# Patient Record
Sex: Female | Born: 1955 | Race: White | Hispanic: Yes | Marital: Married | Smoking: Never smoker
Health system: Southern US, Community
[De-identification: ages and names within clinical notes are randomized; demographics above are authoritative.]

## PROBLEM LIST (undated history)

## (undated) DIAGNOSIS — T4145XA Adverse effect of unspecified anesthetic, initial encounter: Secondary | ICD-10-CM

## (undated) DIAGNOSIS — T782XXA Anaphylactic shock, unspecified, initial encounter: Secondary | ICD-10-CM

## (undated) DIAGNOSIS — T8859XA Other complications of anesthesia, initial encounter: Secondary | ICD-10-CM

## (undated) DIAGNOSIS — F329 Major depressive disorder, single episode, unspecified: Secondary | ICD-10-CM

## (undated) DIAGNOSIS — K5900 Constipation, unspecified: Secondary | ICD-10-CM

## (undated) DIAGNOSIS — N764 Abscess of vulva: Secondary | ICD-10-CM

## (undated) DIAGNOSIS — Z86010 Personal history of colonic polyps: Secondary | ICD-10-CM

## (undated) DIAGNOSIS — J45909 Unspecified asthma, uncomplicated: Secondary | ICD-10-CM

## (undated) DIAGNOSIS — M1990 Unspecified osteoarthritis, unspecified site: Secondary | ICD-10-CM

## (undated) DIAGNOSIS — F32A Depression, unspecified: Secondary | ICD-10-CM

## (undated) DIAGNOSIS — R011 Cardiac murmur, unspecified: Secondary | ICD-10-CM

## (undated) DIAGNOSIS — F419 Anxiety disorder, unspecified: Secondary | ICD-10-CM

## (undated) HISTORY — DX: Unspecified asthma, uncomplicated: J45.909

## (undated) HISTORY — DX: Major depressive disorder, single episode, unspecified: F32.9

## (undated) HISTORY — DX: Unspecified osteoarthritis, unspecified site: M19.90

## (undated) HISTORY — DX: Depression, unspecified: F32.A

## (undated) HISTORY — PX: TONSILLECTOMY: SUR1361

## (undated) HISTORY — PX: KNEE ARTHROSCOPY: SHX127

## (undated) HISTORY — DX: Anxiety disorder, unspecified: F41.9

## (undated) HISTORY — DX: Cardiac murmur, unspecified: R01.1

## (undated) HISTORY — PX: WRIST SURGERY: SHX841

---

## 1982-04-22 HISTORY — PX: HEMORRHOID SURGERY: SHX153

## 1999-06-02 ENCOUNTER — Emergency Department (HOSPITAL_COMMUNITY): Admission: EM | Admit: 1999-06-02 | Discharge: 1999-06-02 | Payer: Self-pay | Admitting: Emergency Medicine

## 1999-06-02 ENCOUNTER — Encounter: Payer: Self-pay | Admitting: Emergency Medicine

## 1999-09-12 ENCOUNTER — Other Ambulatory Visit: Admission: RE | Admit: 1999-09-12 | Discharge: 1999-09-12 | Payer: Self-pay | Admitting: Gynecology

## 1999-10-12 ENCOUNTER — Other Ambulatory Visit: Admission: RE | Admit: 1999-10-12 | Discharge: 1999-10-12 | Payer: Self-pay | Admitting: Obstetrics and Gynecology

## 2000-10-28 ENCOUNTER — Other Ambulatory Visit: Admission: RE | Admit: 2000-10-28 | Discharge: 2000-10-28 | Payer: Self-pay | Admitting: Obstetrics and Gynecology

## 2001-06-11 ENCOUNTER — Ambulatory Visit (HOSPITAL_COMMUNITY): Admission: RE | Admit: 2001-06-11 | Discharge: 2001-06-11 | Payer: Self-pay | Admitting: Obstetrics and Gynecology

## 2001-06-11 ENCOUNTER — Encounter: Payer: Self-pay | Admitting: Obstetrics and Gynecology

## 2001-11-26 ENCOUNTER — Other Ambulatory Visit: Admission: RE | Admit: 2001-11-26 | Discharge: 2001-11-26 | Payer: Self-pay | Admitting: Obstetrics and Gynecology

## 2002-05-21 ENCOUNTER — Encounter: Admission: RE | Admit: 2002-05-21 | Discharge: 2002-05-21 | Payer: Self-pay | Admitting: *Deleted

## 2002-05-21 ENCOUNTER — Encounter: Payer: Self-pay | Admitting: Allergy and Immunology

## 2003-02-23 ENCOUNTER — Other Ambulatory Visit: Admission: RE | Admit: 2003-02-23 | Discharge: 2003-02-23 | Payer: Self-pay | Admitting: Obstetrics and Gynecology

## 2003-11-23 ENCOUNTER — Ambulatory Visit (HOSPITAL_COMMUNITY): Admission: RE | Admit: 2003-11-23 | Discharge: 2003-11-23 | Payer: Self-pay | Admitting: Internal Medicine

## 2003-12-06 ENCOUNTER — Encounter: Admission: RE | Admit: 2003-12-06 | Discharge: 2003-12-06 | Payer: Self-pay | Admitting: Family Medicine

## 2004-03-21 ENCOUNTER — Other Ambulatory Visit: Admission: RE | Admit: 2004-03-21 | Discharge: 2004-03-21 | Payer: Self-pay | Admitting: Obstetrics and Gynecology

## 2005-05-01 ENCOUNTER — Other Ambulatory Visit: Admission: RE | Admit: 2005-05-01 | Discharge: 2005-05-01 | Payer: Self-pay | Admitting: Obstetrics & Gynecology

## 2006-08-07 ENCOUNTER — Other Ambulatory Visit: Admission: RE | Admit: 2006-08-07 | Discharge: 2006-08-07 | Payer: Self-pay | Admitting: *Deleted

## 2011-05-23 ENCOUNTER — Ambulatory Visit (INDEPENDENT_AMBULATORY_CARE_PROVIDER_SITE_OTHER): Payer: BC Managed Care – PPO | Admitting: Family Medicine

## 2011-05-23 VITALS — BP 118/78 | HR 72 | Temp 98.0°F | Resp 16 | Ht 60.0 in | Wt 176.0 lb

## 2011-05-23 DIAGNOSIS — R110 Nausea: Secondary | ICD-10-CM

## 2011-05-23 DIAGNOSIS — Z0000 Encounter for general adult medical examination without abnormal findings: Secondary | ICD-10-CM

## 2011-05-23 DIAGNOSIS — G4700 Insomnia, unspecified: Secondary | ICD-10-CM

## 2011-05-23 DIAGNOSIS — F32A Depression, unspecified: Secondary | ICD-10-CM

## 2011-05-23 DIAGNOSIS — F329 Major depressive disorder, single episode, unspecified: Secondary | ICD-10-CM | POA: Insufficient documentation

## 2011-05-23 DIAGNOSIS — R200 Anesthesia of skin: Secondary | ICD-10-CM

## 2011-05-23 DIAGNOSIS — R202 Paresthesia of skin: Secondary | ICD-10-CM

## 2011-05-23 DIAGNOSIS — E669 Obesity, unspecified: Secondary | ICD-10-CM

## 2011-05-23 DIAGNOSIS — R635 Abnormal weight gain: Secondary | ICD-10-CM

## 2011-05-23 DIAGNOSIS — R209 Unspecified disturbances of skin sensation: Secondary | ICD-10-CM

## 2011-05-23 DIAGNOSIS — M1990 Unspecified osteoarthritis, unspecified site: Secondary | ICD-10-CM

## 2011-05-23 LAB — POCT CBC
HCT, POC: 40.8 % (ref 37.7–47.9)
Hemoglobin: 13.3 g/dL (ref 12.2–16.2)
Lymph, poc: 3.3 (ref 0.6–3.4)
MCH, POC: 30.6 pg (ref 27–31.2)
MCHC: 32.6 g/dL (ref 31.8–35.4)
MPV: 7.1 fL (ref 0–99.8)
POC MID %: 6.4 %M (ref 0–12)
RBC: 4.34 M/uL (ref 4.04–5.48)
WBC: 8.6 10*3/uL (ref 4.6–10.2)

## 2011-05-23 LAB — POCT UA - MICROSCOPIC ONLY
Casts, Ur, LPF, POC: NEGATIVE
Crystals, Ur, HPF, POC: NEGATIVE
Yeast, UA: NEGATIVE

## 2011-05-23 LAB — POCT URINALYSIS DIPSTICK
Glucose, UA: NEGATIVE
Nitrite, UA: NEGATIVE
Protein, UA: NEGATIVE
Spec Grav, UA: 1.015
Urobilinogen, UA: 0.2
pH, UA: 5.5

## 2011-05-23 LAB — TSH: TSH: 1.298 u[IU]/mL (ref 0.350–4.500)

## 2011-05-23 MED ORDER — BUPROPION HCL ER (SR) 150 MG PO TB12: 150.0000 mg | ORAL_TABLET | Freq: Two times a day (BID) | ORAL | Status: DC

## 2011-05-23 MED ORDER — ZOLPIDEM TARTRATE 10 MG PO TABS: 10.0000 mg | ORAL_TABLET | Freq: Every evening | ORAL | Status: DC | PRN

## 2011-05-23 NOTE — Progress Notes (Signed)
Patient Name: Ebony Stanley Date of Birth: 01-05-1956 Medical Record Number: [id number] Gender: female Date of Encounter: 05/23/2011  History of Present Illness:  Ebony Stanley is a 56 y.o. very pleasant female patient who presents with the following:  Desire for complete physical exam.  Dr. Truett Stanley does her pap/ breast exam- last done July 2012.  Does have some arthritis of her wrists.  Has been helped by Celebrex in the past.  She would like more Celebrex.  Does type/ use computer a lot. Also notes numbness "for years" that can occur with extension of her neck.  Numbness had been only in her right hand, but more recently has gone into her left hand as well.  Had seen a chiropractor for this but did not improve.  Wonders if she has a pinched nerve or bulging disc.  Also notes occasional burning pain left shoulder blade that usually occurs after prolonged sitting- this has been present for about 6 months.  Did have x-rays at chiropractor.  Told that her films showed ? "a bulging disc".    Is fasting currently.  But had cholesterol testing in July.  She is trying to work on her diet but admits to slipping up lately  Also notes nausea after eating sometimes -usually occurs about 10 minutes after a meal.  Not really pain- more nausea.  Has tried some tums but nothing else.  No actual vomiting.   Also notes anxiety and depression.  Uses wellbutrin- has used for some time.  Notes trouble with sex drive for some time.  Has discussed this with her OBGyn who started her on progesterone- however still not much better.  Also has trouble with insomnia- has used Ebony Stanley in the past with success   Colonoscopy UTD- done 11/11.  Told to follow-up in a year but she does not want to return to Ebony Stanley- prefers somewhere local.   Immunizations: tetanus 2010, flu shot for the year done.   There is no problem list on file for this patient.  Past Medical History  Diagnosis Date  . Depression     . Arthritis    Past Surgical History  Procedure Date  . Knee arthroscopy   . Hemorrhoid surgery 1984   History  Substance Use Topics  . Smoking status: Never Smoker   . Smokeless tobacco: Never Used  . Alcohol Use: Yes     very rare   Family History  Problem Relation Age of Onset  . Cancer Mother   . Alcohol abuse Father   . Cancer Maternal Grandmother    No Known Allergies  Medication list has been reviewed and updated.  Review of Systems: As per HPI.  + for insomnia, weight gain, anxiety, lack of sex drive, heartburn, numbness and tingling.  Otherwise negative- pink sheet reviewed.    Physical Examination: Filed Vitals:   05/23/11 1433  BP: 118/78  Pulse: 72  Temp: 98 F (36.7 C)  TempSrc: Oral  Resp: 16  Height: 5' (1.524 m)  Weight: 176 lb (79.833 kg)    Body mass index is 34.37 kg/(m^2).   Wt Readings from Last 3 Encounters:  05/23/11 176 lb (79.833 kg)    GEN: well developed, well nourished, no acute distress Eyes: conjunctiva and lids normal, PERRLA, EOMI ENT: TM clear, nares clear, oral exam WNL Neck: supple, no lymphadenopathy, no thyromegaly, no JVD.  Cervical spine exam normal Pulm: clear to auscultation and percussion, respiratory effort normal CV: regular rate and rhythm, S1-S2,  no murmur, rub or gallop, no bruits Chest: no scars, masses, no lumps Stanley: soft, non-tender; no hepatosplenomegaly, no masses; active bowel sounds all quadrants.  Notes some tenderness in RUQ, mildly + murphy's sign- not an acute abdomen Lymph: no cervical, axillary or inguinal adenopathy MSK: gait normal, muscle tone and strength WNL, no joint swelling, effusions, discoloration, crepitus  SKIN: clear, good turgor, color WNL, no rashes, lesions, or ulcerations Neuro: normal mental status, normal strength, sensation, and motion Psych: alert; oriented to person, place and time, normally interactive and not anxious or depressed in appearance.  Results for orders placed  in visit on 05/23/11  POCT URINALYSIS DIPSTICK      Component Value Range   Color, UA YELLOW     Clarity, UA CLEAR     Glucose, UA NEG     Bilirubin, UA NEG     Ketones, UA TRACE     Spec Grav, UA 1.015     Blood, UA TRACE     pH, UA 5.5     Protein, UA NEG     Urobilinogen, UA 0.2     Nitrite, UA NEG     Leukocytes, UA Negative    POCT UA - MICROSCOPIC ONLY      Component Value Range   WBC, Ur, HPF, POC 0-1     RBC, urine, microscopic 0-3     Bacteria, U Microscopic SMALL     Mucus, UA NEG     Epithelial cells, urine per micros 2-5     Crystals, Ur, HPF, POC NEG     Casts, Ur, LPF, POC NEG     Yeast, UA NEG    POCT CBC      Component Value Range   WBC 8.6  4.6 - 10.2 (K/uL)   Lymph, poc 3.3  0.6 - 3.4    POC LYMPH PERCENT 37.8  10 - 50 (%L)   MID (cbc) 0.6  0 - 0.9    POC MID % 6.4  0 - 12 (%M)   POC Granulocyte 4.8  2 - 6.9    Granulocyte percent 55.8  37 - 80 (%G)   RBC 4.34  4.04 - 5.48 (M/uL)   Hemoglobin 13.3  12.2 - 16.2 (g/dL)   HCT, POC 16.1  09.6 - 47.9 (%)   MCV 93.9  80 - 97 (fL)   MCH, POC 30.6  27 - 31.2 (pg)   MCHC 32.6  31.8 - 35.4 (g/dL)   RDW, POC 04.5     Platelet Count, POC 483 (*) 142 - 424 (K/uL)   MPV 7.1  0 - 99.8 (fL)     Assessment and Plan: 1. Physical exam, annual  POCT urinalysis dipstick, POCT UA - Microscopic Only, POCT CBC  2. Obesity  TSH  3. Weight gain  TSH  4. Depression  TSH  5. Numbness and tingling in hands    6. Nausea  Comprehensive metabolic panel  7. Osteoarthritis     Health maint: pap and breast per OB.  Pt will call and schedule a colonoscopy with Ebony Stanley- she understands importance of following up her recent colonoscopy Former pt of Ebony Stanley who did her knee scopes.  Will refer her back to see him to evaluate her cervical spine- I do suspect that she may have nerve impingement causing numbness in her hands with neck extension. Abdominal discomfort and nausea: suspect gallbladder disease.  Referral for ABD  ultrasound.  In the meantime start an OTC acid  reducer such as zantac or pepcid.  If anything gets worse while ultrasound is pending please call or RTC!  Depression/ lack of sex drive.  We noticed that she is taking her wellbutrin SR only once daily- this is often used as a BID medication.  Will try increasing to twice daily to see if this may help with her symptoms.  Also did rx a supply of ambien to use for her insomnia. Discussed with patient face to face in the office- limit use, take right when getting into bed, do not drive after medication, do not mix with alcohol.   Otherwise plan to follow- up pending her labs and studies ######### few RBC in urine with pt at lab phone call

## 2011-05-23 NOTE — Patient Instructions (Signed)
Call [place] [facility] to schedule a follow- up colonoscopy.  Would be a very good idea to try and get your last colonoscopy report from [place] first  I will set up an abdominal ultrasound and also an appointment with an orthopedist.  Try adding an OTC acid reducer for your stomach  Remember to take the ambien only when needed- avoid everyday use when possible.  Remember to take and then get directly into bed.  Do not mix with alcohol  Increase the wellbutrin to twice daily.

## 2011-05-24 LAB — COMPREHENSIVE METABOLIC PANEL
ALT: 27 U/L (ref 0–35)
CO2: 25 mEq/L (ref 19–32)
Calcium: 9.6 mg/dL (ref 8.4–10.5)
Chloride: 101 mEq/L (ref 96–112)
Creat: 0.56 mg/dL (ref 0.50–1.10)
Glucose, Bld: 85 mg/dL (ref 70–99)
Total Protein: 6.7 g/dL (ref 6.0–8.3)

## 2011-07-16 ENCOUNTER — Encounter: Payer: Self-pay | Admitting: Internal Medicine

## 2011-08-26 ENCOUNTER — Other Ambulatory Visit: Payer: Self-pay | Admitting: Internal Medicine

## 2011-11-15 ENCOUNTER — Telehealth: Payer: Self-pay | Admitting: *Deleted

## 2011-11-15 ENCOUNTER — Encounter: Payer: Self-pay | Admitting: Internal Medicine

## 2011-11-15 ENCOUNTER — Ambulatory Visit (AMBULATORY_SURGERY_CENTER): Payer: Self-pay | Admitting: *Deleted

## 2011-11-15 VITALS — Ht 60.0 in | Wt 173.0 lb

## 2011-11-15 DIAGNOSIS — Z1211 Encounter for screening for malignant neoplasm of colon: Secondary | ICD-10-CM

## 2011-11-15 MED ORDER — MOVIPREP 100 G PO SOLR: ORAL | Status: DC

## 2011-11-15 NOTE — Progress Notes (Signed)
Patient states last colonoscopy was 2011 at [facility], [place]. She states that was incomplete because she was not cleaned out and polyps was removed. She was told to repeat colonoscopy in 1 year. She then states she did not complete the entire prep because she thought she was cleaned out enough. Release of information filled out and given to Amanda,CMA.

## 2011-11-15 NOTE — Telephone Encounter (Signed)
Patient last colonoscopy was 2011 at [facility], [place]. She states it was incomplete because she was not cleaned out and states she did not drink all the prep ordered. She thought she was cleaned out enough. She does have chronic constipation. Take daily senokot. She states she had polyps removed and was told to repeat colonoscopy in 1 year. Release of information filled out and given to Amanda,CMA.  The standard moviprep was given to patient. Dr.Gessner may want more prep after seeing her last colonoscopy report. Thanks, Robbin.

## 2011-11-21 ENCOUNTER — Telehealth: Payer: Self-pay | Admitting: Internal Medicine

## 2011-11-21 NOTE — Telephone Encounter (Signed)
Forward 6 pages from [facility] to Dr. Stan Head for review on 11-21-11 ym

## 2011-11-22 NOTE — Telephone Encounter (Signed)
We got records in today for [employer].  Let us know if need to keep colon set up or cancel.  Thank you.

## 2011-11-23 NOTE — Telephone Encounter (Signed)
Continue with plans for colonoscopy

## 2011-11-25 NOTE — Telephone Encounter (Signed)
Thank you Sir.

## 2011-11-29 ENCOUNTER — Encounter: Payer: Self-pay | Admitting: Internal Medicine

## 2012-02-03 ENCOUNTER — Ambulatory Visit (INDEPENDENT_AMBULATORY_CARE_PROVIDER_SITE_OTHER): Payer: BC Managed Care – PPO | Admitting: Family Medicine

## 2012-02-03 VITALS — BP 128/76 | HR 111 | Temp 98.9°F | Resp 17 | Ht 60.0 in | Wt 177.0 lb

## 2012-02-03 DIAGNOSIS — J029 Acute pharyngitis, unspecified: Secondary | ICD-10-CM

## 2012-02-03 MED ORDER — AMOXICILLIN 875 MG PO TABS: 875.0000 mg | ORAL_TABLET | Freq: Two times a day (BID) | ORAL | Status: DC

## 2012-02-03 NOTE — Patient Instructions (Signed)

## 2012-02-03 NOTE — Progress Notes (Signed)
@[email or web address] @   Patient ID: Ebony Stanley MRN: [id number], DOB: 1955/05/12, 56 y.o. Date of Encounter: 02/03/2012, 1:30 PM  Primary Physician: Elvina Sidle, MD  Chief Complaint:  Chief Complaint  Patient presents with  . Sore Throat    on right side   . Fatigue    body pain     HPI: 56 y.o. year old female presents with 1 day history of sore throat. Subjective fever and chills. No cough, congestion, rhinorrhea, sinus pressure, otalgia, or headache. Normal hearing. No GI complaints. Able to swallow saliva, but hurts to do so. Decreased appetite secondary to sore throat.   Past Medical History  Diagnosis Date  . Depression   . Arthritis   . Heart murmur     as child  . Asthma     when pregnant     Home Meds: Prior to Admission medications   Medication Sig Start Date End Date Taking? Authorizing Provider  buPROPion (WELLBUTRIN SR) 150 MG 12 hr tablet Take 1 tablet (150 mg total) by mouth 2 (two) times daily. 05/23/11  Yes Gwenlyn Found Copland, MD  estradiol (ESTRACE) 1 MG tablet Take 1 mg by mouth daily.   Yes Historical Provider, MD  fish oil-omega-3 fatty acids 1000 MG capsule Take 2 g by mouth daily.   Yes Historical Provider, MD  ibuprofen (ADVIL,MOTRIN) 200 MG tablet Take 400 mg by mouth every 6 (six) hours as needed.   Yes Historical Provider, MD  Multiple Vitamins-Minerals (MULTIVITAMIN WITH MINERALS) tablet Take 1 tablet by mouth daily.   Yes Historical Provider, MD  naproxen sodium (ANAPROX) 220 MG tablet Take 220 mg by mouth daily as needed.   Yes Historical Provider, MD  polycarbophil (FIBERCON) 625 MG tablet Take 625 mg by mouth daily.   Yes Historical Provider, MD  progesterone (PROMETRIUM) 100 MG capsule Take 100 mg by mouth daily.   Yes Historical Provider, MD  senna (SENOKOT) 8.6 MG tablet Take 3 tablets by mouth 2 (two) times daily.   Yes Historical Provider, MD  docusate calcium (SURFAK) 240 MG capsule Take 240 mg by mouth 2 (two) times daily.    Historical  Provider, MD  MOVIPREP 100 G SOLR moviprep-take as directed. 11/15/11   Iva Boop, MD    Allergies: No Known Allergies  History   Social History  . Marital Status: Single    Spouse Name: N/A    Number of Children: N/A  . Years of Education: N/A   Occupational History  . Not on file.   Social History Main Topics  . Smoking status: Never Smoker   . Smokeless tobacco: Never Used  . Alcohol Use: Yes     very rare  . Drug Use: No  . Sexually Active: Not on file     G8- has 2 childen, 1sab, 4tab   Other Topics Concern  . Not on file   Social History Narrative  . No narrative on file     Review of Systems: Constitutional: negative for chills, fever, night sweats or weight changes HEENT: see above Cardiovascular: negative for chest pain or palpitations Respiratory: negative for hemoptysis, wheezing, or shortness of breath Abdominal: negative for abdominal pain, nausea, vomiting or diarrhea Dermatological: negative for rash Neurologic: negative for headache   Physical Exam Blood pressure 128/76, pulse 111, temperature 98.9 F (37.2 C), temperature source Oral, resp. rate 17, height 5' (1.524 m), weight 177 lb (80.287 kg), SpO2 97.00%., Body mass index is 34.57 kg/(m^2). General: Well developed, well nourished, in  no acute distress. Head: Normocephalic, atraumatic, eyes without discharge, sclera non-icteric, nares are patent. Bilateral auditory canals clear, TM's are without perforation, pearly grey with reflective cone of light bilaterally. No sinus TTP. Oral cavity moist, dentition normal. Posterior pharynx with post nasal drip and mild erythema. No peritonsillar abscess or tonsillar exudate. Neck: Supple. No thyromegaly. Full ROM. No lymphadenopathy. Lungs: Clear bilaterally to auscultation without wheezes, rales, or rhonchi. Breathing is unlabored. Heart: RRR with S1 S2. No murmurs, rubs, or gallops appreciated. Abdomen: Soft, non-tender, non-distended with  normoactive bowel sounds. No hepatomegaly. No rebound/guarding. No obvious abdominal masses. Msk:  Strength and tone normal for age. Extremities: No clubbing or cyanosis. No edema. Neuro: Alert and oriented X 3. Moves all extremities spontaneously. CNII-XII grossly in tact. Psych:  Responds to questions appropriately with a normal affect.   Labs:   ASSESSMENT AND PLAN:  56 y.o. year old female with  - -Tylenol/Motrin prn -Rest/fluids -RTC precautions -RTC 3-5 days if no improvement  Signed, Elvina Sidle, MD 02/03/2012 1:30 PM

## 2012-02-06 LAB — CULTURE, GROUP A STREP

## 2012-06-08 ENCOUNTER — Ambulatory Visit (INDEPENDENT_AMBULATORY_CARE_PROVIDER_SITE_OTHER): Payer: BC Managed Care – PPO | Admitting: Family Medicine

## 2012-06-08 ENCOUNTER — Ambulatory Visit
Admission: RE | Admit: 2012-06-08 | Discharge: 2012-06-08 | Disposition: A | Discharge status: Home / Self Care | Payer: BC Managed Care – PPO | Source: Ambulatory Visit | Attending: Family Medicine | Admitting: Family Medicine

## 2012-06-08 ENCOUNTER — Telehealth: Payer: Self-pay

## 2012-06-08 VITALS — BP 132/83 | HR 103 | Temp 98.0°F | Resp 16 | Ht 60.0 in | Wt 176.0 lb

## 2012-06-08 DIAGNOSIS — R1032 Left lower quadrant pain: Secondary | ICD-10-CM

## 2012-06-08 DIAGNOSIS — N323 Diverticulum of bladder: Secondary | ICD-10-CM

## 2012-06-08 DIAGNOSIS — R110 Nausea: Secondary | ICD-10-CM

## 2012-06-08 DIAGNOSIS — Z860100 Personal history of colon polyps, unspecified: Secondary | ICD-10-CM

## 2012-06-08 DIAGNOSIS — K625 Hemorrhage of anus and rectum: Secondary | ICD-10-CM

## 2012-06-08 LAB — POCT URINALYSIS DIPSTICK
Bilirubin, UA: NEGATIVE
Ketones, UA: NEGATIVE
Leukocytes, UA: NEGATIVE
Nitrite, UA: NEGATIVE
Protein, UA: NEGATIVE
pH, UA: 5.5

## 2012-06-08 LAB — POCT UA - MICROSCOPIC ONLY
RBC, urine, microscopic: NEGATIVE
Yeast, UA: NEGATIVE

## 2012-06-08 LAB — POCT CBC
Granulocyte percent: 55.6 %G (ref 37–80)
HCT, POC: 45.2 % (ref 37.7–47.9)
MCH, POC: 31.2 pg (ref 27–31.2)
MCV: 94.6 fL (ref 80–97)
MID (cbc): 0.6 (ref 0–0.9)
POC LYMPH PERCENT: 39.1 %L (ref 10–50)
RBC: 4.78 M/uL (ref 4.04–5.48)
WBC: 10.9 10*3/uL — AB (ref 4.6–10.2)

## 2012-06-08 MED ORDER — IOHEXOL 300 MG/ML  SOLN
100.0000 mL | Freq: Once | INTRAMUSCULAR | Status: AC | PRN
  Administered 2012-06-08: 100 mL via INTRAVENOUS

## 2012-06-08 NOTE — Telephone Encounter (Signed)
Called patient she states she is having blood with bowel movement. She states it is in toilet bowl and also on the toilet paper. She states she is not feeling well today also. I have urged her to come in to clinic today, to check to see if this is blood in her stool or if it is hemorrhoids.

## 2012-06-08 NOTE — Telephone Encounter (Signed)
PT HAD GONE TO THE BATHROOM AND HAVE BLOOD COMING OUT WITH HER URINE. CANNOT COME IN RIGHT NOW, BUT WOULD LIKE TO SPEAK WITH SOMEONE ABOUT IT. PLEASE CALL [phone]  SHE HOPE IT WILL BE BEFORE 3:00

## 2012-06-08 NOTE — Progress Notes (Signed)
[facility] [address], [place] [address] [phone]  Date:  06/08/2012   Name:  Ebony Stanley   DOB:  March 28, 1956   MRN:  [id number]  PCP:  Elvina Sidle, MD    Chief Complaint: Rectal Bleeding   History of Present Illness:  Ebony Stanley is a 57 y.o. very pleasant female patient who presents with the following:  She tends to have a sensitive stomach with intermittent upset.  Yesterday she felt bloated- had a BM and felt better.  She felt nauseated this am, tried eating a small amount.  She went to the bathroom and urinated/ had a BM.  She noted that the water in the bowel looked pink, and she wiped and noted a small amount of blood on the TP from her rectum. She returned to the bathroom about an hour later and the same thing happened.    She has a history of hemorrhoids, but had not strained or had any pain with defecation.    She was to have her 2nd colonoscopy this past august- this had to be canceled due to a vacation.  She was noted to have polyps at her colonoscopy in 2012 and was advised to repeat in ONE year.  This was done per [facility]  She has had some bleeding in the past with hemorrhoids, but this seems different.   No vomiting.  Her stomach feels "like I did sit- ups, it's sore"    She went through menopause about 6 years ago.  She did eat a small amount this morning but does not feel much like eating now.    Patient Active Problem List  Diagnosis  . Insomnia  . Depression    Past Medical History  Diagnosis Date  . Depression   . Arthritis   . Heart murmur     as child  . Asthma     when pregnant    Past Surgical History  Procedure Laterality Date  . Knee arthroscopy    . Hemorrhoid surgery  1984    History  Substance Use Topics  . Smoking status: Never Smoker   . Smokeless tobacco: Never Used  . Alcohol Use: Yes     Comment: very rare    Family History  Problem Relation Age of Onset   . Cancer Mother   . Alcohol abuse Father   . Cancer Maternal Grandmother   . Colon cancer Neg Hx     No Known Allergies  Medication list has been reviewed and updated.  Current Outpatient Prescriptions on File Prior to Visit  Medication Sig Dispense Refill  . buPROPion (WELLBUTRIN SR) 150 MG 12 hr tablet Take 1 tablet (150 mg total) by mouth 2 (two) times daily.  60 tablet  6  . docusate calcium (SURFAK) 240 MG capsule Take 240 mg by mouth 2 (two) times daily.      Marland Kitchen estradiol (ESTRACE) 1 MG tablet Take 1 mg by mouth daily.      . fish oil-omega-3 fatty acids 1000 MG capsule Take 2 g by mouth daily.      Marland Kitchen ibuprofen (ADVIL,MOTRIN) 200 MG tablet Take 400 mg by mouth every 6 (six) hours as needed.      . Multiple Vitamins-Minerals (MULTIVITAMIN WITH MINERALS) tablet Take 1 tablet by mouth daily.      . naproxen sodium (ANAPROX) 220 MG tablet Take 220 mg by mouth daily as needed.      . progesterone (  PROMETRIUM) 100 MG capsule Take 100 mg by mouth daily.      Marland Kitchen senna (SENOKOT) 8.6 MG tablet Take 3 tablets by mouth 2 (two) times daily.      Marland Kitchen amoxicillin (AMOXIL) 875 MG tablet Take 1 tablet (875 mg total) by mouth 2 (two) times daily.  20 tablet  0  . MOVIPREP 100 G SOLR moviprep-take as directed.  1 kit  0  . polycarbophil (FIBERCON) 625 MG tablet Take 625 mg by mouth daily.       No current facility-administered medications on file prior to visit.    Review of Systems:  As per HPI- otherwise negative.   Physical Examination: Filed Vitals:   06/08/12 1235  BP: 132/83  Pulse: 103  Temp: 98 F (36.7 C)  Resp: 16   Filed Vitals:   06/08/12 1235  Height: 5' (1.524 m)  Weight: 176 lb (79.833 kg)   Body mass index is 34.37 kg/(m^2). Ideal Body Weight: Weight in (lb) to have BMI = 25: 127.7  GEN: WDWN, NAD, Non-toxic, A & O x 3, overweight HEENT: Atraumatic, Normocephalic. Neck supple. No masses, No LAD.  Bilateral TM wnl, oropharynx normal.  PEERL,EOMI.   Ears and Nose:  No external deformity. CV: RRR, No M/G/R. No JVD. No thrill. No extra heart sounds. PULM: CTA B, no wheezes, crackles, rhonchi. No retractions. No resp. distress. No accessory muscle use. ABD: S, ND, +BS. No rebound. No HSM.  Minimal tenderness over abdomen, most in LLQ  EXTR: No c/c/e NEURO Normal gait.  PSYCH: Normally interactive. Conversant. Not depressed or anxious appearing.  Calm demeanor.  GU: no blood noted on speculum exam, normal vaginal exam, no CMT Rectal: external hemorroid but no evidence of current bleeding.  No gross blood on DRE- trace of pink on glove  Results for orders placed in visit on 06/08/12  IFOBT (OCCULT BLOOD)      Result Value Range   IFOBT Positive    POCT UA - MICROSCOPIC ONLY      Result Value Range   WBC, Ur, HPF, POC 0-2     RBC, urine, microscopic neg     Bacteria, U Microscopic trace     Mucus, UA neg     Epithelial cells, urine per micros 0-2     Crystals, Ur, HPF, POC neg     Casts, Ur, LPF, POC neg     Yeast, UA neg    POCT URINALYSIS DIPSTICK      Result Value Range   Color, UA yellow     Clarity, UA clear     Glucose, UA neg     Bilirubin, UA neg     Ketones, UA neg     Spec Grav, UA 1.020     Blood, UA neg     pH, UA 5.5     Protein, UA neg     Urobilinogen, UA 0.2     Nitrite, UA neg     Leukocytes, UA Negative    POCT CBC      Result Value Range   WBC 10.9 (*) 4.6 - 10.2 K/uL   Lymph, poc 4.3 (*) 0.6 - 3.4   POC LYMPH PERCENT 39.1  10 - 50 %L   MID (cbc) 0.6  0 - 0.9   POC MID % 5.3  0 - 12 %M   POC Granulocyte 6.1  2 - 6.9   Granulocyte percent 55.6  37 - 80 %G   RBC 4.78  4.04 -  5.48 M/uL   Hemoglobin 14.9  12.2 - 16.2 g/dL   HCT, POC 16.1  09.6 - 47.9 %   MCV 94.6  80 - 97 fL   MCH, POC 31.2  27 - 31.2 pg   MCHC 33.0  31.8 - 35.4 g/dL   RDW, POC 04.5     Platelet Count, POC 588 (*) 142 - 424 K/uL   MPV 7.4  0 - 99.8 fL  POCT URINE PREGNANCY      Result Value Range   Preg Test, Ur Negative      Assessment and  Plan: Rectal bleeding - Plan: IFOBT POC (occult bld, rslt in office)  Nausea alone - Plan: POCT UA - Microscopic Only, POCT urinalysis dipstick, POCT CBC  Personal history of colonic polyps - Plan: Ambulatory referral to Gastroenterology  Abdominal pain, left lower quadrant - Plan: CT Abdomen Pelvis W Contrast, POCT urine pregnancy  Ebony Stanley is here with stomach upset and a small amount of rectal bleeding today.  Suspicious for colitis or possibly diverticulitis.  Discussed a CT scan and she elected to proceed with this today.    Digby Groeneveld, MD  CT ABDOMEN AND PELVIS WITH CONTRAST  Technique: Multidetector CT imaging of the abdomen and pelvis was performed following the standard protocol during bolus administration of intravenous contrast.  Contrast: OMNIPAQUE IOHEXOL 300 MG/ML SOLN  Comparison: None.  Findings: Visualized lung bases clear. 2 cm low attenuation lesion in the posterior right hepatic segment without enhancement on delayed studies, probably cyst but incompletely characterized. Unremarkable gallbladder, spleen, adrenal glands, pancreas, kidneys, aorta. Portal vein patent. Stomach physiologically distended. Small bowel and colon are nondilated. Appendix not discretely identified. There is no pericecal inflammatory/edematous change however. Uterus and adnexal regions unremarkable. Urinary bladder incompletely distended. No ascites. No free air. No adenopathy. Lumbar spine intact.  IMPRESSION:  1. Unremarkable study  Called and discussed with her- CT is negative.  Consider starting abx vs waiting to see how she feels tomorrow.  Will need to repeat her colonoscopy in short order- have referred her back to see GI.  Plan to check her status in the am- if still having any symptoms will start cipro

## 2012-06-08 NOTE — Patient Instructions (Addendum)
Please proceed to [facility] to have your CT scan.  I will call you to discuss the results as soon as they come in

## 2012-06-09 ENCOUNTER — Telehealth: Payer: Self-pay

## 2012-06-09 ENCOUNTER — Telehealth: Payer: Self-pay | Admitting: Family Medicine

## 2012-06-09 NOTE — Telephone Encounter (Signed)
Dr copland patient would like for you to call her regarding the ct scan from yesterday please call at [phone]

## 2012-06-09 NOTE — Telephone Encounter (Signed)
Scan was unremarkable. Dr Patsy Lager did speak to her this am. I called her. Hope she is still improving. Left message for her to call me back and advise if she wants sooner appt with GI Dr.

## 2012-06-09 NOTE — Telephone Encounter (Signed)
Message copied by Pearline Cables on Tue Jun 09, 2012  8:56 AM ------      Message from: Abbe Amsterdam C      Created: Mon Jun 08, 2012  5:24 PM       Call and check on her ------

## 2012-06-09 NOTE — Telephone Encounter (Signed)
Called to check on her- she had more bleeding last night (when she wiped)- her stomach feels upset but she is not sure if this was due to her contrast yesterday.   She noted some pinkish color on the TP last night.  No gross bleeding She is not having any diarrhea/ loose stools.  No vomiting, no fever.  She feels tired but not acutely ill.   Offered to get her in with GI today, but she prefers to see how things go today.  Assuming she feels better today we will have her see GI in the next month or so, but if she does not feel better today she will call me or come back in.

## 2012-06-10 ENCOUNTER — Encounter: Payer: Self-pay | Admitting: Internal Medicine

## 2012-06-10 ENCOUNTER — Telehealth: Payer: Self-pay | Admitting: Radiology

## 2012-06-10 NOTE — Telephone Encounter (Signed)
FYI, patient is scheduled for her appt with Dr Leone Payor on March 11th and a colonoscopy on March 25th.

## 2012-06-10 NOTE — Telephone Encounter (Signed)
Spoke to patient, she is asking to have Colonoscopy without visit with GI first, I advised her we make referral to GI and they determine her need for colonoscopy. I spoke to Lupita Leash, she is trying to get the appt at [facility] scheduled for her soon. She is calling now to check on this. I have advised patient to also call [place] about this. To you FYI

## 2012-06-11 ENCOUNTER — Telehealth: Payer: Self-pay | Admitting: Family Medicine

## 2012-06-11 ENCOUNTER — Telehealth: Payer: Self-pay | Admitting: Internal Medicine

## 2012-06-11 ENCOUNTER — Telehealth: Payer: Self-pay

## 2012-06-11 NOTE — Telephone Encounter (Signed)
Patient is advised. Dr Patsy Lager also wants her to come back in here for a recheck tomorrow or this weekend. I will call tomorrow and cancel at [facility].

## 2012-06-11 NOTE — Telephone Encounter (Signed)
She was advised to contact GI at [facility] and see if they can move this up sooner. She will do this.

## 2012-06-11 NOTE — Telephone Encounter (Signed)
Called patient to see if she has had any luck with getting a sooner appt. Left message for her to call me back.

## 2012-06-11 NOTE — Telephone Encounter (Signed)
We have been in communication with Ebony Stanley regarding her symptoms- she states she is about the same.  We were able to get her an appt with [facility] on Monday, but then [place] was able to see her tomorrow after all.  See notes, appreciate consultation.  Assuming she does make this appt tomorrow I will cancel with [facility]

## 2012-06-11 NOTE — Telephone Encounter (Signed)
Patient wants to know her gastro [facility] referral phone number to find out more information. She requests to speak to Amy. Patient advised to call [place], she says she lost the phone number. Best number: [phone]

## 2012-06-11 NOTE — Telephone Encounter (Signed)
She states she has not, I called over and could not get sooner appt either. I was told a message would be sent to the nurse. DR Copland asked me to call over to [facility] to see if they could get her in sooner. They can see her on Monday, Dr Elnoria Howard can see her at 10 :30.

## 2012-06-11 NOTE — Telephone Encounter (Signed)
Patient provided number.

## 2012-06-11 NOTE — Telephone Encounter (Signed)
I spoke with Dr. Patsy Lager about the patient she is concerned about her waiting for an office visit or colonoscopy until 06/30/12.  Patient having rectal bleeding.  I advised Dr. Patsy Lager I will call and speak with the patient about an office visit tomorrow.  Patient has agreed to come see Doug Sou, PA tomorrow at 3:30.  She has some reservations about being able to make it here at that time, due to her work schedule.  She will call tomorrow if she is not able to make that time and there will be no charge to her, I will help her reschedule in the event she is not able to get off work at that time.

## 2012-06-12 ENCOUNTER — Ambulatory Visit (INDEPENDENT_AMBULATORY_CARE_PROVIDER_SITE_OTHER): Payer: BC Managed Care – PPO | Admitting: Gastroenterology

## 2012-06-12 ENCOUNTER — Encounter: Payer: Self-pay | Admitting: Gastroenterology

## 2012-06-12 ENCOUNTER — Telehealth: Payer: Self-pay | Admitting: *Deleted

## 2012-06-12 VITALS — BP 114/78 | HR 100 | Ht 60.0 in | Wt 174.0 lb

## 2012-06-12 DIAGNOSIS — K625 Hemorrhage of anus and rectum: Secondary | ICD-10-CM

## 2012-06-12 DIAGNOSIS — K5900 Constipation, unspecified: Secondary | ICD-10-CM

## 2012-06-12 DIAGNOSIS — Z860100 Personal history of colon polyps, unspecified: Secondary | ICD-10-CM | POA: Insufficient documentation

## 2012-06-12 DIAGNOSIS — D126 Benign neoplasm of colon, unspecified: Secondary | ICD-10-CM

## 2012-06-12 DIAGNOSIS — R109 Unspecified abdominal pain: Secondary | ICD-10-CM | POA: Insufficient documentation

## 2012-06-12 HISTORY — DX: Personal history of colonic polyps: Z86.010

## 2012-06-12 MED ORDER — NA SULFATE-K SULFATE-MG SULF 17.5-3.13-1.6 GM/177ML PO SOLN: 1.0000 | Freq: Once | ORAL | Status: DC

## 2012-06-12 MED ORDER — HYOSCYAMINE SULFATE 0.125 MG SL SUBL: 0.1250 mg | SUBLINGUAL_TABLET | SUBLINGUAL | Status: DC | PRN

## 2012-06-12 NOTE — Telephone Encounter (Signed)
I called to ask [facility] [place] if they had the colonoscopy report that goes with the path report we have that was previously faxed to [place] 11/2011.  The path report shows polyps and the location of them in the colon.  They faxed me a report but it is the Preoperative Evaluation, Anesthesia report.  I called them back and they said they did not have the colonoscopy report from  03-19-2010.

## 2012-06-12 NOTE — Patient Instructions (Addendum)
We sent a prescription for Levsin SL for cramping and spasms to [facility]. We have given you a sample of Suprep for the colonoscopy prep.  You have been scheduled for a colonoscopy with propofol. Please follow written instructions given to you at your visit today.  . If you use inhalers (even only as needed) or a CPAP machine, please bring them with you on the day of your procedure.

## 2012-06-12 NOTE — Progress Notes (Signed)
06/12/2012 Ebony Stanley [id number] [birth year]   HISTORY OF PRESENT ILLNESS:  Patient is a pleasant 57 year old female who presents to our office today as a new patient.  She was previously scheduled for a colonoscopy with Dr. Leone Payor in 2013 but had to cancel that procedure and never rescheduled.  Now, she comes in stating that earlier this week she had experienced 2-3 days of rectal bleeding, bright red blood on the toilet paper and in the toilet.  The bleeding has no longer been present for the past couple of days.  She has also been having some abdominal discomfort and cramping.  CT scan of the abdomen and pelvis with and without contrast on 2/17 was normal.  CBC showed only an elevated platelet count (had been elevated in the past as well according to records).  TSH and CMP were recent normal as well.  She had a colonoscopy in 02/2010 by Dr. Noe Gens in [facility].  We have the pathology report for that colonoscopy, but no operative report.  She had a TA with low grade dysplasia removed from the cecum and one in the rectum at 15 cm as well.  Also had a hyperplastic polyp removed from the mid-ascending colon.    Says that she has lifelong constipation and takes vegetable laxatives and stool softeners to help her move her bowels.   Past Medical History  Diagnosis Date  . Depression   . Arthritis   . Heart murmur     as child  . Asthma     when pregnant   Past Surgical History  Procedure Laterality Date  . Knee arthroscopy Right     x 2  . Hemorrhoid surgery  1984  . Tonsillectomy      reports that she has never smoked. She has never used smokeless tobacco. She reports that  drinks alcohol. She reports that she does not use illicit drugs. family history includes Alcohol abuse in her father; Bipolar disorder in her daughter; Leukemia in her paternal grandmother; and Ovarian cancer in her mother.  There is no history of Colon cancer. No Known Allergies    Outpatient Encounter  Prescriptions as of 06/12/2012  Medication Sig Dispense Refill  . buPROPion (WELLBUTRIN SR) 150 MG 12 hr tablet Take 1 tablet (150 mg total) by mouth 2 (two) times daily.  60 tablet  6  . CELEBREX 200 MG capsule Take 200 mg by mouth daily.       Marland Kitchen docusate calcium (SURFAK) 240 MG capsule Take 240 mg by mouth 2 (two) times daily.      Marland Kitchen estradiol (ESTRACE) 1 MG tablet Take 1 mg by mouth daily.      . fish oil-omega-3 fatty acids 1000 MG capsule Take 2 g by mouth daily.      Marland Kitchen ibuprofen (ADVIL,MOTRIN) 200 MG tablet Take 400 mg by mouth every 6 (six) hours as needed.      . Multiple Vitamins-Minerals (MULTIVITAMIN WITH MINERALS) tablet Take 1 tablet by mouth daily.      . naproxen sodium (ANAPROX) 220 MG tablet Take 220 mg by mouth daily as needed.      . polycarbophil (FIBERCON) 625 MG tablet Take 625 mg by mouth as needed.       . progesterone (PROMETRIUM) 100 MG capsule Take 100 mg by mouth daily.      Marland Kitchen senna (SENOKOT) 8.6 MG tablet Take 3 tablets by mouth 2 (two) times daily.      . [DISCONTINUED] amoxicillin (AMOXIL)  875 MG tablet Take 1 tablet (875 mg total) by mouth 2 (two) times daily.  20 tablet  0  . [DISCONTINUED] MOVIPREP 100 G SOLR moviprep-take as directed.  1 kit  0   No facility-administered encounter medications on file as of 06/12/2012.     REVIEW OF SYSTEMS  : All other systems reviewed and negative except where noted in the History of Present Illness.   PHYSICAL EXAM: Ht 5' (1.524 m)  Wt 174 lb (78.926 kg)  BMI 33.98 kg/m2 General: Well developed white female in no acute distress Head: Normocephalic and atraumatic Eyes:  sclerae anicteric, conjunctive pink. Ears: Normal auditory acuity Neck: Supple, no masses.  Lungs: Clear throughout to auscultation Heart: Regular rate and rhythm Abdomen: Soft, non-distended. No masses or hepatomegaly noted. Normal bowel sounds.  Mild left sided TTP without R/R/G. Rectal: Deferred.  Will be performed at the time of  colonoscopy. Musculoskeletal: Symmetrical with no gross deformities  Skin: No lesions on visible extremities Extremities: No edema  Neurological: Alert oriented x 4, grossly nonfocal Cervical Nodes:  No significant cervical adenopathy Psychological:  Alert and cooperative. Normal mood and affect  ASSESSMENT AND PLAN: -History of tubular adenomas with low grade dysplasia -Rectal bleeding, now resolved -Abdominal pain/cramping:  CT abdomen and pelvis with contrast normal 2/17  *Schedule colonoscopy.  The risks, benefits, and alternatives were discussed with the patient and she consents to proceed.  *Will give levsinto take prn for abdominal cramping in the interim.

## 2012-06-12 NOTE — Telephone Encounter (Signed)
She did see [place] today- appt with [facility] canceled, thanked them for helping [place]

## 2012-06-14 ENCOUNTER — Telehealth: Payer: Self-pay

## 2012-06-14 NOTE — Telephone Encounter (Signed)
Patient is scheduled for colonoscopy tomorrow - she is cramping and nauseated  Dr. Patsy Lager told her to call for medication to help with both.   [facility] [address] [facility]   CBN:  [phone]

## 2012-06-14 NOTE — Telephone Encounter (Signed)
Can she have anything?

## 2012-06-15 ENCOUNTER — Encounter: Payer: Self-pay | Admitting: Internal Medicine

## 2012-06-15 ENCOUNTER — Telehealth: Payer: Self-pay | Admitting: Family Medicine

## 2012-06-15 ENCOUNTER — Ambulatory Visit (AMBULATORY_SURGERY_CENTER): Payer: BC Managed Care – PPO | Admitting: Internal Medicine

## 2012-06-15 VITALS — BP 128/75 | HR 82 | Temp 99.1°F | Resp 16 | Ht 60.0 in | Wt 174.0 lb

## 2012-06-15 DIAGNOSIS — D126 Benign neoplasm of colon, unspecified: Secondary | ICD-10-CM

## 2012-06-15 MED ORDER — SODIUM CHLORIDE 0.9 % IV SOLN: 500.0000 mL | INTRAVENOUS | Status: DC

## 2012-06-15 NOTE — Patient Instructions (Addendum)
There was a small polyp in the colon that I removed. You have hemorrhoids also and these were what bled. The polyp looks benign - do not worry - I will send you a letter about it.  The colon is also stained from laxative use - not a problem but noted.  Thank you for choosing me and [facility].  Iva Boop, MD, [facility]  Colon polyps and hemorrhoids handouts given today. Resume current medications. Call us with any questions or concerns. Thank you!!  YOU HAD AN ENDOSCOPIC PROCEDURE TODAY AT THE [facility]: Refer to the procedure report that was given to you for any specific questions about what was found during the examination.  If the procedure report does not answer your questions, please call your gastroenterologist to clarify.  If you requested that your care partner not be given the details of your procedure findings, then the procedure report has been included in a sealed envelope for you to review at your convenience later.  YOU SHOULD EXPECT: Some feelings of bloating in the abdomen. Passage of more gas than usual.  Walking can help get rid of the air that was put into your GI tract during the procedure and reduce the bloating. If you had a lower endoscopy (such as a colonoscopy or flexible sigmoidoscopy) you may notice spotting of blood in your stool or on the toilet paper. If you underwent a bowel prep for your procedure, then you may not have a normal bowel movement for a few days.  DIET: Your first meal following the procedure should be a light meal and then it is ok to progress to your normal diet.  A half-sandwich or bowl of soup is an example of a good first meal.  Heavy or fried foods are harder to digest and may make you feel nauseous or bloated.  Likewise meals heavy in dairy and vegetables can cause extra gas to form and this can also increase the bloating.  Drink plenty of fluids but you should avoid alcoholic beverages for 24 hours.  ACTIVITY: Your  care partner should take you home directly after the procedure.  You should plan to take it easy, moving slowly for the rest of the day.  You can resume normal activity the day after the procedure however you should NOT DRIVE or use heavy machinery for 24 hours (because of the sedation medicines used during the test).    SYMPTOMS TO REPORT IMMEDIATELY: A gastroenterologist can be reached at any hour.  During normal business hours, 8:30 AM to 5:00 PM Monday through Friday, call [phone].  After hours and on weekends, please call the GI answering service at [phone] who will take a message and have the physician on call contact you.   Following lower endoscopy (colonoscopy or flexible sigmoidoscopy):  Excessive amounts of blood in the stool  Significant tenderness or worsening of abdominal pains  Swelling of the abdomen that is new, acute  Fever of 100F or higher  Following upper endoscopy (EGD)  Vomiting of blood or coffee ground material  New chest pain or pain under the shoulder blades  Painful or persistently difficult swallowing  New shortness of breath  Fever of 100F or higher  Black, tarry-looking stools  FOLLOW UP: If any biopsies were taken you will be contacted by phone or by letter within the next 1-3 weeks.  Call your gastroenterologist if you have not heard about the biopsies in 3 weeks.  Our staff will call the  home number listed on your records the next business day following your procedure to check on you and address any questions or concerns that you may have at that time regarding the information given to you following your procedure. This is a courtesy call and so if there is no answer at the home number and we have not heard from you through the emergency physician on call, we will assume that you have returned to your regular daily activities without incident.  SIGNATURES/CONFIDENTIALITY: You and/or your care partner have signed paperwork which will be  entered into your electronic medical record.  These signatures attest to the fact that that the information above on your After Visit Summary has been reviewed and is understood.  Full responsibility of the confidentiality of this discharge information lies with you and/or your care-partner.

## 2012-06-15 NOTE — Op Note (Signed)
[facility] [address] [place], [address]   COLONOSCOPY PROCEDURE REPORT  PATIENT: Ebony Stanley, Ebony Stanley  MR#: [id number] BIRTHDATE: 1955-05-08 , 56  yrs. old GENDER: Female ENDOSCOPIST: Iva Boop, MD, [facility] REFERRED OZ:HYQM Milus Glazier, M.D. PROCEDURE DATE:  06/15/2012 PROCEDURE:   Colonoscopy with snare polypectomy ASA CLASS:   Class II INDICATIONS:Rectal Bleeding. MEDICATIONS: propofol (Diprivan) 300mg  IV, MAC sedation, administered by CRNA, and These medications were titrated to patient response per physician's verbal order  DESCRIPTION OF PROCEDURE:   After the risks benefits and alternatives of the procedure were thoroughly explained, informed consent was obtained.  A digital rectal exam revealed external hemorrhoids and A digital rectal exam revealed internal hemorrhoids.   The LB CF-[id number]  endoscope was introduced through the anus and advanced to the cecum, which was identified by both the appendix and ileocecal valve. No adverse events experienced.   The quality of the prep was Suprep excellent  The instrument was then slowly withdrawn as the colon was fully examined.      COLON FINDINGS: A polypoid shaped sessile polyp measuring 7 mm in size was found at the cecum.  A polypectomy was performed with a cold snare and with cold forceps.  The resection was complete and the polyp tissue was completely retrieved.   Moderate sized internal and external hemorrhoids were found.   Moderate melanosis was found throughout the entire examined colon.   The colon mucosa was otherwise normal.   A right colon retroflexion was performed. Retroflexed views revealed internal/external hemorrhoids. The time to cecum=1 minutes 12 seconds.  Withdrawal time=11 minutes 40 seconds.  The scope was withdrawn and the procedure completed. COMPLICATIONS: There were no complications.  ENDOSCOPIC IMPRESSION: 1.   Sessile polyp measuring 7 mm in size was found at the  cecum; polypectomy was performed with a cold snare and with cold forceps 2.   Moderate sized internal and external hemorrhoids - cause of recent bleeding 3.   Moderate melanosis was found throughout the entire examined colon 4.   The colon mucosa was otherwise normal - excellent prep in patient w/ hx 2 adenomas removed 2011  RECOMMENDATIONS: Timing of repeat colonoscopy will be determined by pathology findings.  eSigned:  Iva Boop, MD, [facility] 06/15/2012 3:45 PM   cc: Elvina Sidle, MD and The Patient

## 2012-06-15 NOTE — Telephone Encounter (Signed)
It looks like patient is getting colonoscopy today. Can we call her and find out if she is still having problems or if they have been resolved by GI.

## 2012-06-15 NOTE — Progress Notes (Signed)
Agree with Ebony Stanley's assessment and plan. 

## 2012-06-15 NOTE — Progress Notes (Signed)
Patient requesting "prescription laxative" as per Dr.Gessner per patient. Tried calling Dr.Gessner's office but no answer. Explained to patient to call back office and leave message for Dr.Gessner. She expresses understanding. Ebony Stanley

## 2012-06-15 NOTE — Progress Notes (Signed)
Called to room to assist during endoscopic procedure.  Patient ID and intended procedure confirmed with present staff. Received instructions for my participation in the procedure from the performing physician.  

## 2012-06-15 NOTE — Telephone Encounter (Signed)
Called her back- not sure if she still needed anything from [place] as she had her colonoscopy today.  LMOM- please let us know if there is anything we can do to help

## 2012-06-15 NOTE — Telephone Encounter (Signed)
Dr Patsy Lager, which appt did patient keep? Do I need to cancel at [facility]? Or with [facility]?

## 2012-06-15 NOTE — Progress Notes (Signed)
Patient did not experience any of the following events: a burn prior to discharge; a fall within the facility; wrong site/side/patient/procedure/implant event; or a hospital transfer or hospital admission upon discharge from the facility. (G8907) Patient did not have preoperative order for IV antibiotic SSI prophylaxis. (G8918)  

## 2012-06-16 ENCOUNTER — Telehealth: Payer: Self-pay | Admitting: *Deleted

## 2012-06-16 ENCOUNTER — Telehealth: Payer: Self-pay | Admitting: Family Medicine

## 2012-06-16 NOTE — Telephone Encounter (Signed)
Called pt back and advised of Dr.'s suggestion to use miralax daily and if that is not helpful to contact his office for a prescription.

## 2012-06-16 NOTE — Telephone Encounter (Signed)
See phone message from Dr Patsy Lager. She left message asking pt to CB if she still needs [place] for anything since her colonoscopy.

## 2012-06-16 NOTE — Telephone Encounter (Deleted)
  Follow up Call-  Call back number 06/15/2012  Post procedure Call Back phone  # [phone]  Permission to leave phone message Yes     Patient questions:  Do you have a fever, pain , or abdominal swelling? no Pain Score  0 *  Have you tolerated food without any problems? yes  Have you been able to return to your normal activities? yes  Do you have any questions about your discharge instructions: Diet   no Medications  yes Follow up visit  no  Do you have questions or concerns about your Care? yes  Actions: * If pain score is 4 or above: Physician/ provider Notified : Stan Head, MD.  Pt has questions about laxative, pt was taking vegetable laxative before procedure and pt states Dr. Leone Payor talked about giving her a prescription for a different laxative, pt states she did not get a prescription

## 2012-06-16 NOTE — Telephone Encounter (Signed)
Not a phone call- sent letter

## 2012-06-16 NOTE — Telephone Encounter (Signed)
  Follow up Call-  Call back number 06/15/2012  Post procedure Call Back phone  # [phone]  Permission to leave phone message Yes     Patient questions:  Do you have a fever, pain , or abdominal swelling? no Pain Score  0 *  Have you tolerated food without any problems? yes  Have you been able to return to your normal activities? yes  Do you have any questions about your discharge instructions: Diet   no Medications  yes Follow up visit  no  Do you have questions or concerns about your Care? yes  Actions: * If pain score is 4 or above: No action needed, pain <4.  Pt has question about laxatives, pt states she was taking a vegetable laxative before and want to know what to take now. Pt states you briefly went over this with her after the procedure but did not give her a prescription for a laxative.

## 2012-06-16 NOTE — Telephone Encounter (Signed)
I advised daily MiraLax  If that is not helpful she could come see me in the office about other Rx options

## 2012-06-22 ENCOUNTER — Encounter: Payer: Self-pay | Admitting: Internal Medicine

## 2012-06-22 NOTE — Progress Notes (Signed)
Quick Note:  7 mm adenoma Repeat colonoscopy 05/2017 ______

## 2012-07-01 ENCOUNTER — Other Ambulatory Visit: Payer: Self-pay | Admitting: Family Medicine

## 2012-07-07 ENCOUNTER — Telehealth: Payer: Self-pay | Admitting: Family Medicine

## 2012-07-07 NOTE — Telephone Encounter (Signed)
Called to check on her- she is doing a lot better.  Let her know that I had refilled her ambien on 3/13, and that the recommended dosage for women is now 5 mg.  Also, let her know that it looks like I did not follow- up her labs from her physical performed on 05/22/12- this was done when we had first converted to Epic and I was having problems with some labs not coming to me/ leaving my inbox without evaluation. This problem is now resolved, and I apologized for not getting these labs to her sooner.  However, her labs looked ok.  She did have 0-3 RBC in her urine, but this is now cleared and I discussed this with urology who did not feel evaluation was needed.  She does still need to have her platelets rechecked- she will come in for a CPE and CBC soon

## 2012-07-10 ENCOUNTER — Other Ambulatory Visit: Payer: Self-pay | Admitting: Family Medicine

## 2012-07-14 ENCOUNTER — Encounter: Payer: BC Managed Care – PPO | Admitting: Internal Medicine

## 2012-08-24 ENCOUNTER — Telehealth: Payer: Self-pay

## 2012-08-24 NOTE — Telephone Encounter (Signed)
PATIENT STATES THAT SHE IS VERY SICK, SHE IS NAUSEOUS, AND CANNOT STAND UP FOR LONG PERIODS OF TIME. PATIENT STATES SHE IS UNABLE TO COME IN FOR AN OV BECAUSE SHE TOO WEAK. WANTS TO KNOW IF AN RX CAN BE PRESCRIBED THAT SHE CAN PICK UP FROM THE PHARMACY. [facility] [address] PLEASE CALL BACK AT: [phone]

## 2012-08-24 NOTE — Telephone Encounter (Signed)
Unfortunately we can not do this without visit. Called her. If she can not come here, she should go to ER. She states she thinks she has sinus infection. Had dizziness/vomiting this morning.

## 2012-10-28 ENCOUNTER — Ambulatory Visit: Payer: BC Managed Care – PPO

## 2012-10-28 ENCOUNTER — Ambulatory Visit (INDEPENDENT_AMBULATORY_CARE_PROVIDER_SITE_OTHER): Payer: BC Managed Care – PPO | Admitting: Family Medicine

## 2012-10-28 VITALS — BP 112/82 | HR 85 | Temp 97.7°F | Resp 18

## 2012-10-28 DIAGNOSIS — M545 Low back pain, unspecified: Secondary | ICD-10-CM

## 2012-10-28 DIAGNOSIS — M778 Other enthesopathies, not elsewhere classified: Secondary | ICD-10-CM

## 2012-10-28 DIAGNOSIS — M533 Sacrococcygeal disorders, not elsewhere classified: Secondary | ICD-10-CM

## 2012-10-28 LAB — POCT UA - MICROSCOPIC ONLY
Casts, Ur, LPF, POC: NEGATIVE
Crystals, Ur, HPF, POC: NEGATIVE
Yeast, UA: NEGATIVE

## 2012-10-28 LAB — POCT URINALYSIS DIPSTICK
Blood, UA: NEGATIVE
Nitrite, UA: NEGATIVE
Protein, UA: NEGATIVE
Spec Grav, UA: 1.015
Urobilinogen, UA: 0.2
pH, UA: 5.5

## 2012-10-28 MED ORDER — KETOROLAC TROMETHAMINE 60 MG/2ML IM SOLN
60.0000 mg | Freq: Once | INTRAMUSCULAR | Status: AC
  Administered 2012-10-28: 60 mg via INTRAMUSCULAR

## 2012-10-28 MED ORDER — METHOCARBAMOL 750 MG PO TABS: ORAL_TABLET | ORAL | Status: DC

## 2012-10-28 MED ORDER — TRAMADOL HCL 50 MG PO TABS: 50.0000 mg | ORAL_TABLET | Freq: Three times a day (TID) | ORAL | Status: DC | PRN

## 2012-10-28 MED ORDER — OXAPROZIN 600 MG PO TABS: ORAL_TABLET | ORAL | Status: DC

## 2012-10-28 NOTE — Progress Notes (Signed)
Subjective: 57 year old lady who is here with low back pain. This started about Thursday. No specific injury. He just started hurting her it has gradually gotten worse. Yesterday and last night and this morning the worst. She had difficulty even reaching around to wipe herself when she went to the bathroom last night. She has had mild back strains in the past, but this is entirely different. She is not having any dysuria or hematuria. No problems with her bowels. Bowels moved yesterday.  Objective: Overweight lady in moderately severe distress, was laying on her left side crying when I entered the room. Abdomen soft and nontender. No CVA tenderness. Spine is nontender, get down to close to the SI joint on the left. The right is normal. Buttock is nontender. Flexion anterior only about 35 before she has to much pain to move forward. Lateral flexion is adequate. Truncal rotation is adequate. Straight leg raising test essentially negative. On straight leg raising of the right leg she has a little but of pain in left.  Assessment: Low back pain sacroileitis   Plan: Urinalysis and LS spine x-rays Toradol  60 IM  [facility] reading (PRIMARY) by  Dr. Alwyn Ren Normal spine .

## 2012-10-28 NOTE — Patient Instructions (Addendum)
Take the Robaxin one in the morning, one in the afternoon, and 2 at bedtime for muscle accident  Take the Daypro one twice daily for pain and inflammation  Take the tramadol every 6 hours as needed for severe pain  Return if not improving over the next for 5 days. He might end up needing some physical therapy  Use ice or ice and heat alternatively on the painful area

## 2012-11-30 ENCOUNTER — Encounter: Payer: Self-pay | Admitting: Obstetrics and Gynecology

## 2012-11-30 ENCOUNTER — Ambulatory Visit (INDEPENDENT_AMBULATORY_CARE_PROVIDER_SITE_OTHER): Payer: BC Managed Care – PPO | Admitting: Obstetrics and Gynecology

## 2012-11-30 ENCOUNTER — Other Ambulatory Visit: Payer: Self-pay | Admitting: Obstetrics and Gynecology

## 2012-11-30 ENCOUNTER — Telehealth: Payer: Self-pay | Admitting: Obstetrics and Gynecology

## 2012-11-30 VITALS — BP 110/66 | HR 88 | Ht 60.5 in | Wt 178.0 lb

## 2012-11-30 DIAGNOSIS — N951 Menopausal and female climacteric states: Secondary | ICD-10-CM

## 2012-11-30 DIAGNOSIS — F329 Major depressive disorder, single episode, unspecified: Secondary | ICD-10-CM

## 2012-11-30 DIAGNOSIS — Z01419 Encounter for gynecological examination (general) (routine) without abnormal findings: Secondary | ICD-10-CM

## 2012-11-30 DIAGNOSIS — E663 Overweight: Secondary | ICD-10-CM

## 2012-11-30 DIAGNOSIS — Z8041 Family history of malignant neoplasm of ovary: Secondary | ICD-10-CM | POA: Insufficient documentation

## 2012-11-30 DIAGNOSIS — Z0000 Encounter for general adult medical examination without abnormal findings: Secondary | ICD-10-CM

## 2012-11-30 DIAGNOSIS — L989 Disorder of the skin and subcutaneous tissue, unspecified: Secondary | ICD-10-CM

## 2012-11-30 LAB — COMPREHENSIVE METABOLIC PANEL
ALT: 32 U/L (ref 0–35)
AST: 29 U/L (ref 0–37)
Calcium: 9.3 mg/dL (ref 8.4–10.5)
Chloride: 103 mEq/L (ref 96–112)
Creat: 0.7 mg/dL (ref 0.50–1.10)
Sodium: 137 mEq/L (ref 135–145)
Total Bilirubin: 0.4 mg/dL (ref 0.3–1.2)
Total Protein: 6.6 g/dL (ref 6.0–8.3)

## 2012-11-30 LAB — POCT URINALYSIS DIPSTICK
Blood, UA: NEGATIVE
Ketones, UA: NEGATIVE
Protein, UA: NEGATIVE
Urobilinogen, UA: NEGATIVE

## 2012-11-30 LAB — LIPID PANEL
Cholesterol: 199 mg/dL (ref 0–200)
LDL Cholesterol: 110 mg/dL — ABNORMAL HIGH (ref 0–99)
Total CHOL/HDL Ratio: 3.3 Ratio
VLDL: 29 mg/dL (ref 0–40)

## 2012-11-30 LAB — CBC
MCV: 90.1 fL (ref 78.0–100.0)
Platelets: 450 10*3/uL — ABNORMAL HIGH (ref 150–400)
RDW: 14.3 % (ref 11.5–15.5)
WBC: 6.6 10*3/uL (ref 4.0–10.5)

## 2012-11-30 MED ORDER — BUPROPION HCL ER (XL) 150 MG PO TB24: 150.0000 mg | ORAL_TABLET | Freq: Every day | ORAL | Status: DC

## 2012-11-30 MED ORDER — PROGESTERONE MICRONIZED 100 MG PO CAPS: 100.0000 mg | ORAL_CAPSULE | Freq: Every day | ORAL | Status: DC

## 2012-11-30 MED ORDER — ESTRADIOL 1 MG PO TABS: 0.5000 mg | ORAL_TABLET | Freq: Every day | ORAL | Status: DC

## 2012-11-30 NOTE — Telephone Encounter (Signed)
Patient notified of [facility]olly. Patient aware of referral to Dr. Evelene Croon.

## 2012-11-30 NOTE — Telephone Encounter (Signed)
Patient seen today per Dr. Edward Jolly. AEX  Patient request refill on Wellbutrin HCL  XL 150mg  one every day per patient. Stated she called Dr. Nolen Mu office and the next appt. Was not for another 2 months. Stated she did not make appointment with Dr. Nolen Mu today.  Please advise on Rx for patient and appointment.

## 2012-11-30 NOTE — Patient Instructions (Addendum)

## 2012-11-30 NOTE — Telephone Encounter (Signed)
I will refill the patient's Wellbutrin HCl XL 150 mg in Epic.  Please let her know.   I will put in a referral request for her to see Dr. Evelene Croon.

## 2012-11-30 NOTE — Telephone Encounter (Signed)
Patient tried to make an  appointment with Dr. Nolen Mu  It will be at least 2 months out. Needs refills on Wellbutrin. Sig: # 30 1 qd HCL XL (gen)

## 2012-11-30 NOTE — Progress Notes (Signed)
Patient ID: Ebony Stanley, female   DOB: [birth year], 56 y.o.   MRN: [id number] 58 y.o.   Single    Caucasian   female   [id number]   here for annual exam.    Interested in maybe weaning off HRT and patient uncertain if needs to be on Wellbutrin or another Rx.  No hot flashes.  Started HRT for mood swings.   Wondering if she is bipolar.  Reports periods of feeling very high and feeling very low.  Taking Wellbutrin uncertain if it is XL or SR. State she takes it only once a day.    Mother with history of ovarian cancer.   Asking about genetic testing.   Patient reports a skin lesion near right elbow that will not go away.  Saw Dr. Campbell Stall in the past.  Patient's last menstrual period was 04/22/2006.          Sexually active: yes  The current method of family planning is post menopausal status.    Exercising: no Last mammogram:  11/2011 wnl:[facility] Last pap smear: 11/2011 wnl History of abnormal pap: no Smoking: no Alcohol: rarely Last colonoscopy: 05/2012 colon polyps with Dr. Gesner:next colonoscopy in 1-3 years. Last Bone Density:  never Last tetanus shot: up to date Last cholesterol check: 2012 wnl    Family History  Problem Relation Age of Onset  . Ovarian cancer Mother   . Hyperlipidemia Mother   . Alcohol abuse Father   . Hyperlipidemia Father   . Colon cancer Neg Hx   . Bipolar disorder Daughter   . Leukemia Paternal Grandmother     Patient Active Problem List   Diagnosis Date Noted  . Personal history of colonic adenomas 06/12/2012  . Unspecified constipation 06/12/2012  . Insomnia 05/23/2011  . Depression 05/23/2011    Past Medical History  Diagnosis Date  . Depression   . Arthritis   . Heart murmur     as child  . Asthma     when pregnant  . Anxiety     Past Surgical History  Procedure Laterality Date  . Knee arthroscopy Right     x 2  . Hemorrhoid surgery  1984  . Tonsillectomy      Allergies: Review of patient's allergies indicates  no known allergies.  Current Outpatient Prescriptions  Medication Sig Dispense Refill  . buPROPion (WELLBUTRIN SR) 150 MG 12 hr tablet Take 1 tablet (150 mg total) by mouth 2 (two) times daily.  60 tablet  6  . docusate calcium (SURFAK) 240 MG capsule Take 240 mg by mouth 2 (two) times daily.      Marland Kitchen estradiol (ESTRACE) 1 MG tablet Take 1 mg by mouth daily.      Marland Kitchen ibuprofen (ADVIL,MOTRIN) 200 MG tablet Take 400 mg by mouth every 6 (six) hours as needed.      . Multiple Vitamins-Minerals (MULTIVITAMIN WITH MINERALS) tablet Take 1 tablet by mouth daily.      . naproxen sodium (ANAPROX) 220 MG tablet Take 220 mg by mouth daily as needed.      . progesterone (PROMETRIUM) 100 MG capsule Take 100 mg by mouth daily.      Marland Kitchen senna (SENOKOT) 8.6 MG tablet Take 3 tablets by mouth 2 (two) times daily.      Marland Kitchen zolpidem (AMBIEN) 10 MG tablet Take 0.5 tablets (5 mg total) by mouth at bedtime as needed for sleep. Dosage for females now limited to 5 mg a day  30 tablet  0  . CELEBREX 200 MG capsule Take 200 mg by mouth daily.       . fish oil-omega-3 fatty acids 1000 MG capsule Take 2 g by mouth daily.      . hyoscyamine (LEVSIN/SL) 0.125 MG SL tablet Place 1 tablet (0.125 mg total) under the tongue every 4 (four) hours as needed for cramping.  15 tablet  0  . methocarbamol (ROBAXIN-750) 750 MG tablet Take one in the morning, one in the afternoon, and 2 at bedtime for muscle relaxant  40 tablet  0  . oxaprozin (DAYPRO) 600 MG tablet Take one twice daily for pain and inflammation  30 tablet  0  . polycarbophil (FIBERCON) 625 MG tablet Take 625 mg by mouth as needed.       . traMADol (ULTRAM) 50 MG tablet Take 1 tablet (50 mg total) by mouth every 8 (eight) hours as needed for pain.  20 tablet  0  . zolpidem (AMBIEN) 10 MG tablet Take 1 tablet (10 mg total) by mouth at bedtime as needed for sleep.  30 tablet  0   No current facility-administered medications for this visit.    ROS: Pertinent items are noted in  HPI.  Social Hx:  Married. Youth worker.   Exam:    BP 110/66  Pulse 88  Ht 5' 0.5" (1.537 m)  Wt 178 lb (80.74 kg)  BMI 34.18 kg/m2  LMP 04/22/2006   Wt Readings from Last 3 Encounters:  11/30/12 178 lb (80.74 kg)  06/15/12 174 lb (78.926 kg)  06/12/12 174 lb (78.926 kg)     Ht Readings from Last 3 Encounters:  11/30/12 5' 0.5" (1.537 m)  06/15/12 5' (1.524 m)  06/12/12 5' (1.524 m)    General appearance: alert, cooperative and appears stated age Head: Normocephalic, without obvious abnormality, atraumatic Neck: no adenopathy, supple, symmetrical, trachea midline and thyroid not enlarged, symmetric, no tenderness/mass/nodules Lungs: clear to auscultation bilaterally Breasts: Inspection negative, No nipple retraction or dimpling, No nipple discharge or bleeding, No axillary or supraclavicular adenopathy, Normal to palpation without dominant masses Heart: regular rate and rhythm Abdomen: obese, soft, non-tender;  no masses,  no organomegaly Extremities: extremities normal, atraumatic, no cyanosis or edema Skin: Skin color, texture, turgor normal.  Raised 0.75 cm erythematous thickened patch near the right elbow with partially disrupted skin. Lymph nodes: Cervical, supraclavicular, and axillary nodes normal. No abnormal inguinal nodes palpated Neurologic: Grossly normal   Pelvic: External genitalia:  no lesions              Urethra:  normal appearing urethra with no masses, tenderness or lesions              Bartholins and Skenes: normal                 Vagina: normal appearing vagina with normal color and discharge, no lesions              Cervix: normal appearance              Pap taken: yes and high risk HPV.        Bimanual Exam:  Uterus:  uterus is normal size, shape, consistency and nontender                                      Adnexa: normal adnexa in size, nontender and no masses  Rectovaginal: Confirms                                       Anus:  normal sphincter tone, no lesions  A: normal menopausal exam Obesity. Family history of ovarian cancer. Desire to wean off HRT. Perimenopausal mood swings.  Skin lesion.     P:     M[facility] at [facility].  Patient will call to schedule. pap smear and high risk HPV testing. General blood work - lipid profile, CMP, CBC, TSH. Return for pelvic ultrasound to check ovaries.  [facility]. Reduce Estrace to 0.5 md daily.  See Epic orders. Prometrium 100 mg daily.  See Epic orders. Patient given the name and phone number of Dr. Emerson Monte to schedule an appointment.  Refer to Dr. Campbell Stall. return annually or prn     An After Visit Summary was printed and given to the patient.

## 2012-12-01 ENCOUNTER — Telehealth: Payer: Self-pay | Admitting: Genetic Counselor

## 2012-12-01 ENCOUNTER — Telehealth: Payer: Self-pay

## 2012-12-01 NOTE — Telephone Encounter (Signed)
PT CALLED TO SCHEDULED GENETIC APPT 09/15 @ 3:30 Mariea Clonts [employer] PACKET MAILED.

## 2012-12-01 NOTE — Telephone Encounter (Signed)
LVOM PT TO RETURN CALL IN RE TO REFERRAL.

## 2012-12-14 ENCOUNTER — Ambulatory Visit
Admission: RE | Admit: 2012-12-14 | Discharge: 2012-12-14 | Disposition: A | Discharge status: Home / Self Care | Payer: BC Managed Care – PPO | Source: Ambulatory Visit | Attending: Obstetrics and Gynecology | Admitting: Obstetrics and Gynecology

## 2012-12-14 DIAGNOSIS — Z01419 Encounter for gynecological examination (general) (routine) without abnormal findings: Secondary | ICD-10-CM

## 2012-12-24 ENCOUNTER — Ambulatory Visit (INDEPENDENT_AMBULATORY_CARE_PROVIDER_SITE_OTHER): Payer: BC Managed Care – PPO | Admitting: Obstetrics and Gynecology

## 2012-12-24 ENCOUNTER — Encounter: Payer: Self-pay | Admitting: Obstetrics and Gynecology

## 2012-12-24 ENCOUNTER — Ambulatory Visit (INDEPENDENT_AMBULATORY_CARE_PROVIDER_SITE_OTHER): Payer: BC Managed Care – PPO

## 2012-12-24 VITALS — BP 110/80 | HR 76 | Ht 60.5 in | Wt 176.0 lb

## 2012-12-24 DIAGNOSIS — E663 Overweight: Secondary | ICD-10-CM

## 2012-12-24 DIAGNOSIS — Z8041 Family history of malignant neoplasm of ovary: Secondary | ICD-10-CM

## 2012-12-24 MED ORDER — ESTRADIOL 1 MG PO TABS: 1.0000 mg | ORAL_TABLET | Freq: Every day | ORAL | Status: DC

## 2012-12-24 NOTE — Patient Instructions (Signed)
BRCA-1 and BRCA-2 BRCA-1 and BRCA-2 are 2 genes that are linked with hereditary breast and ovarian cancers. About 200,000 women are diagnosed with invasive breast cancer each year and about 23,000 with ovarian cancer (according to the American Cancer Society). Of these cancers, about 5% to 10% will be due to a mutation in one of the BRCA genes. Men can also inherit an increased risk of developing breast cancer, primarily from an alteration in the BRCA-2 gene.  Individuals with mutations in BRCA1 or BRCA2 have significantly elevated risks for breast cancer (up to 80% lifetime risk), ovarian cancer (up to 40% lifetime risk), bilateral breast cancer and other types of cancers. BRCA mutations are inherited and passed from generation to generation. One half of the time, they are passed from the father's side of the family.  The DNA in white blood cells is used to detect mutations in the BRCA genes. While the gene products (proteins) of the BRCA genes act only in breast and ovarian tissue, the genes are present in every cell of the body and blood is the most easily accessible source of that DNA. PREPARATION FOR TEST The test for BRCA mutations is done on a blood sample collected by needle from a vein in the arm. The test does not require surgical biopsy of breast or ovarian tissue.  NORMAL FINDINGS No genetic mutations. Ranges for normal findings may vary among different laboratories and hospitals. You should always check with your doctor after having lab work or other tests done to discuss the meaning of your test results and whether your values are considered within normal limits. MEANING OF TEST  Your caregiver will go over the test results with you and discuss the importance and meaning of your results, as well as treatment options and the need for additional tests if necessary. OBTAINING THE TEST RESULTS It is your responsibility to obtain your test results. Ask the lab or department performing the test  when and how you will get your results. OTHER THINGS TO KNOW Your test results may have implications for other family members. When one member of a family is tested for BRCA mutations, issues often arise about how or whether to share this information with other family members. Seek advice from a genetic counselor about communication of result with your family members.  Pre and post test consultation with a health care provider knowledgeable about genetic testing cannot be overemphasized.  There are many issues to be considered when preparing for a genetic test and upon learning the results, and a genetic counselor has the knowledge and experience to help you sort through them.  If the BRCA test is positive, the options include increased frequency of check-ups (e.g., mammography, blood tests for CA-125, or transvaginal ultrasonography); medications that could reduce risk (e.g., oral contraceptives or tamoxifen); or surgical removal of the ovaries or breasts. There are a number of variables involved and it is important to discuss your options with your doctor and genetic counselor. Research studies have reported that for every 1000 women negative for BRCA mutations, between 12 and 45 of them will develop breast cancer by age 50 and between 3 and 4 will develop ovarian cancer by age 50. The risk increases with age. The test can be ordered by a doctor, preferably by one who can also offer genetic counseling. The blood sample will be sent to a laboratory that specializes in BRCA testing. The American Society of Clinical Oncology and the National Breast Cancer Coalition encourage women seeking the   test to participate in long-term outcome studies to help gather information on the effectiveness of different check-up and treatment options. Document Released: 05/02/2004 Document Revised: 07/01/2011 Document Reviewed: 03/14/2008 [facility] Patient Information 2014 [place], [place].   Please go to [email or web address] for  more information.

## 2012-12-24 NOTE — Progress Notes (Signed)
Subjective  Patient is here for pelvic ultrasound today to check ovaries.  Has a family history of mother with ovarian cancer. Sees  A geneticist in 1 - 2 weeks for discussion of family history and potential genetic testing.   Patient tried to reduce dosage of her Estrace to 0.5 md daily, but hot flashes became unbearable.  Now back up to 1 mg daily along with the Prometrium.  Objective  See ultrasound below. Small uterine fibroid and normal ovaries.  No free fluid.      Assessment  Family history of ovarian cancer. Normal pelvic ultrasound. Menopausal symptoms.  Plan  complete consultation with geneticist. OK to continue with Estrace 1 mg daily in addition of Prometrium 100 mg daily. See Epic orders. Return in one year or prn.

## 2012-12-29 ENCOUNTER — Telehealth: Payer: Self-pay | Admitting: Genetic Counselor

## 2012-12-29 NOTE — Telephone Encounter (Signed)
R/s appointment from Monday 9/15 to Thursday 9/11

## 2012-12-29 NOTE — Telephone Encounter (Signed)
Called to r/s her appointment on Monday.  She could not talk and will call back.

## 2012-12-31 ENCOUNTER — Other Ambulatory Visit: Payer: BC Managed Care – PPO

## 2012-12-31 ENCOUNTER — Encounter: Payer: BC Managed Care – PPO | Admitting: Genetic Counselor

## 2013-01-04 ENCOUNTER — Encounter: Payer: BC Managed Care – PPO | Admitting: Genetic Counselor

## 2013-01-04 ENCOUNTER — Other Ambulatory Visit: Payer: BC Managed Care – PPO | Admitting: Lab

## 2013-01-12 ENCOUNTER — Telehealth: Payer: Self-pay | Admitting: *Deleted

## 2013-01-12 NOTE — Telephone Encounter (Signed)
Called pt to get her reschedule for genetics and she is needing a 3:30 appt.  Told her that I would have to get w/ Clydie Braun and see what I could come up with.  Emailed Clydie Braun for some dates and requested paperwork for this pt.

## 2013-01-18 ENCOUNTER — Telehealth: Payer: Self-pay | Admitting: *Deleted

## 2013-01-18 NOTE — Telephone Encounter (Signed)
Pt returned my call and I confirmed 02/15/13 genetic appt w/ pt.  Mailed information packet & calendar to pt.

## 2013-02-15 ENCOUNTER — Encounter: Payer: Self-pay | Admitting: Genetic Counselor

## 2013-02-15 ENCOUNTER — Other Ambulatory Visit: Payer: BC Managed Care – PPO | Admitting: Lab

## 2013-02-15 ENCOUNTER — Ambulatory Visit (HOSPITAL_BASED_OUTPATIENT_CLINIC_OR_DEPARTMENT_OTHER): Payer: BC Managed Care – PPO | Admitting: Genetic Counselor

## 2013-02-15 DIAGNOSIS — IMO0002 Reserved for concepts with insufficient information to code with codable children: Secondary | ICD-10-CM

## 2013-02-15 DIAGNOSIS — Z8041 Family history of malignant neoplasm of ovary: Secondary | ICD-10-CM

## 2013-02-15 NOTE — Progress Notes (Signed)
Dr.  Conley Simmonds, MD requested a consultation for genetic counseling and risk assessment for Ebony Stanley, a 57 y.o. female, for discussion of her family history of ovarian cancer.  She presents to clinic today to discuss the possibility of a genetic predisposition to cancer, and to further clarify her risks, as well as her family members' risks for cancer.   HISTORY OF PRESENT ILLNESS: Ebony Stanley is a 57 y.o. female with no personal history of cancer.  She has had two colonoscopies in the past, which found a total of 3 polyps.  She is not aware that she is on an increased screening schedule for colonoscopy.  Ebony Stanley receives transvaginal u/s and CA-125 to screen for ovarian cancer.  To date, these have been normal, with her most recent u/s in the last few months.  Past Medical History  Diagnosis Date  . Depression   . Arthritis   . Heart murmur     as child  . Asthma     when pregnant  . Anxiety     Past Surgical History  Procedure Laterality Date  . Knee arthroscopy Right     x 2  . Hemorrhoid surgery  1984  . Tonsillectomy      History   Social History  . Marital Status: Single    Spouse Name: N/A    Number of Children: 2  . Years of Education: N/A   Occupational History  . Youth worker    Social History Main Topics  . Smoking status: Never Smoker   . Smokeless tobacco: Never Used  . Alcohol Use: Yes     Comment: very rare  . Drug Use: No  . Sexual Activity: Yes    Birth Control/ Protection: Post-menopausal     Comment: G8- has 2 childen, 1sab, 4tab   Other Topics Concern  . None   Social History Narrative  . None    REPRODUCTIVE HISTORY AND PERSONAL RISK ASSESSMENT FACTORS: Menarche was at age 32.5.   postmenopausal Uterus Intact: yes Ovaries Intact: yes G2P2A0, first live birth at age 35  She has not previously undergone treatment for infertility.   Oral Contraceptive use: 10 years   She has used HRT in the past.    FAMILY  HISTORY:  We obtained a detailed, 4-generation family history.  Significant diagnoses are listed below: Family History  Problem Relation Age of Onset  . Ovarian cancer Mother 55  . Hyperlipidemia Mother   . Alcohol abuse Father   . Hyperlipidemia Father   . Colon cancer Neg Hx   . Bipolar disorder Daughter   . Leukemia Paternal Grandmother   The patient's mother emigrated from [place], but all of her relatives remained there.  The patient does not have any health history on her mother's relatives.  Patient's maternal ancestors are of [place] descent, and paternal ancestors are of [place], [place] and [place] descent. There is no reported Ashkenazi Jewish ancestry. There is no known consanguinity.  GENETIC COUNSELING ASSESSMENT: Ebony Stanley is a 57 y.o. female with a family history of ovarian cancer which somewhat suggestive of a hereditary ovarian cancer syndrome and predisposition to cancer. We, therefore, discussed and recommended the following at today's visit.   DISCUSSION: We reviewed the characteristics, features and inheritance patterns of hereditary cancer syndromes. We also discussed genetic testing, including the appropriate family members to test, the process of testing, insurance coverage and turn-around-time for results. We discussed that the most common reason why someone would have ovarian  cancer is because of BRCA1 and BRCA2 mutations.  Ovarian cancer is also seen in Lynch syndrome, or could be the result of changes in other genes.  We discussed genetic testing and that if she is negative, we cannot determine whether her mother's ovarian cancer is the result of a hereditary cancer syndrome that the patient did not inherit.    PLAN: After considering the risks, benefits, and limitations, Ebony Stanley provided informed consent to pursue genetic testing and the blood sample will be sent to [facility] for analysis of the Breast/Ovarian Cancer Panel. We discussed the  implications of a positive, negative and/ or variant of uncertain significance genetic test result. Results should be available within approximately 3 weeks' time, at which point they will be disclosed by telephone to Ebony Stanley, as will any additional recommendations warranted by these results. Ebony Stanley will receive a summary of her genetic counseling visit and a copy of her results once available. This information will also be available in Epic. We encouraged Ebony Stanley to remain in contact with cancer genetics annually so that we can continuously update the family history and inform her of any changes in cancer genetics and testing that may be of benefit for her family. Ebony Stanley's questions were answered to her satisfaction today. Our contact information was provided should additional questions or concerns arise.  The patient was seen for a total of 45 minutes, greater than 50% of which was spent face-to-face counseling.  This note will also be sent to the referring provider via the electronic medical record. The patient will be supplied with a summary of this genetic counseling discussion as well as educational information on the discussed hereditary cancer syndromes following the conclusion of their visit.   Patient was discussed with Dr. Drue Second.   _______________________________________________________________________ For Office Staff:  Number of people involved in session: 1 Was an Intern/ student involved with case: no

## 2013-02-25 ENCOUNTER — Other Ambulatory Visit: Payer: Self-pay

## 2013-03-02 ENCOUNTER — Telehealth: Payer: Self-pay | Admitting: Genetic Counselor

## 2013-03-02 NOTE — Telephone Encounter (Signed)
Revealed Negative genetic testing.

## 2013-03-05 ENCOUNTER — Encounter: Payer: Self-pay | Admitting: Genetic Counselor

## 2013-07-15 ENCOUNTER — Telehealth: Payer: Self-pay | Admitting: Obstetrics and Gynecology

## 2013-07-15 NOTE — Telephone Encounter (Signed)
Spoke with patient. Patient is going for a physical tomorrow and was wondering when she last had her annual exam and if blood work was drawn at that time. States "I just couldn't remember." Advised last annual was on 11/30/2012 and that she did have labs drawn which included lipid panel, TSH, and CBC. Also had urinalysis done at this appointment. Patient states she was "I was just curious so that I can tell them all I had done at my visit there." Patient is agreeable to information provided and verbalizes understanding.  Routing to provider for final review. Patient agreeable to disposition. Will close encounter

## 2013-07-15 NOTE — Telephone Encounter (Signed)
Patient has a few questions regarding her last aex and labs she may have had done.

## 2013-11-13 ENCOUNTER — Other Ambulatory Visit: Payer: Self-pay | Admitting: Obstetrics and Gynecology

## 2013-11-15 NOTE — Telephone Encounter (Signed)
Last AEX: 11/30/12 Last refill:12/24/12 #30, 10rf Current AEX:12/02/13  Pt has enough refills to cover until AEX in Aug Encounter closed

## 2013-11-21 ENCOUNTER — Other Ambulatory Visit: Payer: Self-pay | Admitting: Obstetrics and Gynecology

## 2013-11-22 NOTE — Telephone Encounter (Signed)
Last reflilled: 12/24/12 #30/10 Mammogram: 12/16/12 Bi-Rads 1 Aex scheduled for 12/02/13  Estradiol 1 mg #30/0 refills sent to pharmacy to last patient until AEX

## 2013-12-01 ENCOUNTER — Telehealth: Payer: Self-pay | Admitting: Emergency Medicine

## 2013-12-01 NOTE — Telephone Encounter (Signed)
Received incoming fax from [facility] to request prior authorization for Estradiol 1 mg po daily.  Insurance listed as Education officer, community.  Called [employer] at [phone] They said that this is patient's workers [employer] and that I needed to call [employer] at [phone].  Called [employer] and they state that this workers compensation plan and will not cover oral estradiol.  Called [facility] and message to advise to please run rx through patient's primary insurance and not workers compensation plan and to please refax any prior authorization needed with new insurance company.

## 2013-12-02 ENCOUNTER — Ambulatory Visit: Payer: BC Managed Care – PPO | Admitting: Obstetrics and Gynecology

## 2013-12-06 ENCOUNTER — Other Ambulatory Visit: Payer: Self-pay | Admitting: Obstetrics and Gynecology

## 2013-12-06 NOTE — Telephone Encounter (Signed)
Last AEX and refill 11/30/12 #30/11 R No future appt scheduled.   Please advise.

## 2013-12-06 NOTE — Telephone Encounter (Signed)
Please contact patient to have her make her appointment for her annual exam with me.

## 2013-12-07 NOTE — Telephone Encounter (Signed)
LM for pt re: Rx sent for 1 month. Due for AEX, call back to make appt.

## 2013-12-17 ENCOUNTER — Other Ambulatory Visit: Payer: Self-pay | Admitting: Obstetrics and Gynecology

## 2013-12-17 NOTE — Telephone Encounter (Signed)
Last AEX: 11/30/12 Last refill:11/30/12 #30 X 11 Current AEX:NS Last MMG: 12/16/12 Bi-Rads Neg  Please advise

## 2013-12-17 NOTE — Telephone Encounter (Signed)
LMOM to contact office to schedule AEX

## 2013-12-17 NOTE — Telephone Encounter (Signed)
Patient needs to call to schedule annual examination.

## 2013-12-24 ENCOUNTER — Other Ambulatory Visit: Payer: Self-pay | Admitting: Obstetrics and Gynecology

## 2013-12-29 LAB — CYTOLOGY - PAP

## 2014-02-21 ENCOUNTER — Encounter: Payer: Self-pay | Admitting: Genetic Counselor

## 2014-03-29 ENCOUNTER — Encounter: Payer: Self-pay | Admitting: *Deleted

## 2014-09-16 ENCOUNTER — Emergency Department (HOSPITAL_COMMUNITY): Payer: BC Managed Care – PPO

## 2014-09-16 ENCOUNTER — Emergency Department (HOSPITAL_COMMUNITY)
Admission: EM | Admit: 2014-09-16 | Discharge: 2014-09-16 | Disposition: A | Discharge status: Home / Self Care | Payer: BC Managed Care – PPO | Attending: Emergency Medicine | Admitting: Emergency Medicine

## 2014-09-16 ENCOUNTER — Encounter (HOSPITAL_COMMUNITY): Payer: Self-pay | Admitting: *Deleted

## 2014-09-16 DIAGNOSIS — R109 Unspecified abdominal pain: Secondary | ICD-10-CM

## 2014-09-16 DIAGNOSIS — Z8719 Personal history of other diseases of the digestive system: Secondary | ICD-10-CM | POA: Insufficient documentation

## 2014-09-16 DIAGNOSIS — F419 Anxiety disorder, unspecified: Secondary | ICD-10-CM | POA: Insufficient documentation

## 2014-09-16 DIAGNOSIS — Z79899 Other long term (current) drug therapy: Secondary | ICD-10-CM | POA: Insufficient documentation

## 2014-09-16 DIAGNOSIS — Z791 Long term (current) use of non-steroidal anti-inflammatories (NSAID): Secondary | ICD-10-CM | POA: Diagnosis not present

## 2014-09-16 DIAGNOSIS — M1990 Unspecified osteoarthritis, unspecified site: Secondary | ICD-10-CM | POA: Insufficient documentation

## 2014-09-16 DIAGNOSIS — R011 Cardiac murmur, unspecified: Secondary | ICD-10-CM | POA: Insufficient documentation

## 2014-09-16 DIAGNOSIS — N201 Calculus of ureter: Secondary | ICD-10-CM | POA: Insufficient documentation

## 2014-09-16 DIAGNOSIS — R1011 Right upper quadrant pain: Secondary | ICD-10-CM

## 2014-09-16 DIAGNOSIS — F329 Major depressive disorder, single episode, unspecified: Secondary | ICD-10-CM | POA: Insufficient documentation

## 2014-09-16 HISTORY — DX: Constipation, unspecified: K59.00

## 2014-09-16 LAB — CBC WITH DIFFERENTIAL/PLATELET
BASOS ABS: 0 10*3/uL (ref 0.0–0.1)
Basophils Relative: 0 % (ref 0–1)
Eosinophils Absolute: 0 10*3/uL (ref 0.0–0.7)
Eosinophils Relative: 0 % (ref 0–5)
HEMATOCRIT: 40.6 % (ref 36.0–46.0)
Hemoglobin: 13.9 g/dL (ref 12.0–15.0)
LYMPHS ABS: 2.1 10*3/uL (ref 0.7–4.0)
LYMPHS PCT: 17 % (ref 12–46)
MCH: 30.4 pg (ref 26.0–34.0)
MCHC: 34.2 g/dL (ref 30.0–36.0)
MCV: 88.8 fL (ref 78.0–100.0)
Monocytes Absolute: 0.5 10*3/uL (ref 0.1–1.0)
Monocytes Relative: 4 % (ref 3–12)
NEUTROS PCT: 79 % — AB (ref 43–77)
Neutro Abs: 9.7 10*3/uL — ABNORMAL HIGH (ref 1.7–7.7)
Platelets: 458 10*3/uL — ABNORMAL HIGH (ref 150–400)
RBC: 4.57 MIL/uL (ref 3.87–5.11)
RDW: 13.1 % (ref 11.5–15.5)
WBC: 12.3 10*3/uL — ABNORMAL HIGH (ref 4.0–10.5)

## 2014-09-16 LAB — COMPREHENSIVE METABOLIC PANEL
ALK PHOS: 51 U/L (ref 38–126)
ALT: 30 U/L (ref 14–54)
AST: 25 U/L (ref 15–41)
Albumin: 4.4 g/dL (ref 3.5–5.0)
Anion gap: 10 (ref 5–15)
BUN: 23 mg/dL — AB (ref 6–20)
CO2: 25 mmol/L (ref 22–32)
CREATININE: 0.74 mg/dL (ref 0.44–1.00)
Calcium: 9.2 mg/dL (ref 8.9–10.3)
Chloride: 104 mmol/L (ref 101–111)
GFR calc non Af Amer: >60 mL/min (ref >60)
Glucose, Bld: 133 mg/dL — ABNORMAL HIGH (ref 65–99)
Potassium: 3.3 mmol/L — ABNORMAL LOW (ref 3.5–5.1)
Sodium: 139 mmol/L (ref 135–145)
Total Bilirubin: 0.2 mg/dL — ABNORMAL LOW (ref 0.3–1.2)
Total Protein: 7.5 g/dL (ref 6.5–8.1)

## 2014-09-16 LAB — URINALYSIS, ROUTINE W REFLEX MICROSCOPIC
BILIRUBIN URINE: NEGATIVE
Glucose, UA: NEGATIVE mg/dL
KETONES UR: 40 mg/dL — AB
Leukocytes, UA: NEGATIVE
Nitrite: NEGATIVE
PH: 6 (ref 5.0–8.0)
PROTEIN: NEGATIVE mg/dL
SPECIFIC GRAVITY, URINE: 1.024 (ref 1.005–1.030)
Urobilinogen, UA: 0.2 mg/dL (ref 0.0–1.0)

## 2014-09-16 LAB — URINE MICROSCOPIC-ADD ON

## 2014-09-16 LAB — LIPASE, BLOOD: Lipase: 22 U/L (ref 22–51)

## 2014-09-16 MED ORDER — OXYCODONE-ACETAMINOPHEN 5-325 MG PO TABS
1.0000 | ORAL_TABLET | Freq: Once | ORAL | Status: AC
  Administered 2014-09-16: 1 via ORAL
  Filled 2014-09-16: qty 1

## 2014-09-16 MED ORDER — ONDANSETRON HCL 4 MG/2ML IJ SOLN
4.0000 mg | Freq: Once | INTRAMUSCULAR | Status: AC
  Administered 2014-09-16: 4 mg via INTRAVENOUS
  Filled 2014-09-16: qty 2

## 2014-09-16 MED ORDER — IOHEXOL 300 MG/ML  SOLN
100.0000 mL | Freq: Once | INTRAMUSCULAR | Status: AC | PRN
  Administered 2014-09-16: 100 mL via INTRAVENOUS

## 2014-09-16 MED ORDER — MORPHINE SULFATE 4 MG/ML IJ SOLN
4.0000 mg | Freq: Once | INTRAMUSCULAR | Status: AC
  Administered 2014-09-16: 4 mg via INTRAVENOUS
  Filled 2014-09-16: qty 1

## 2014-09-16 MED ORDER — TAMSULOSIN HCL 0.4 MG PO CAPS: 0.4000 mg | ORAL_CAPSULE | Freq: Every day | ORAL | Status: DC

## 2014-09-16 MED ORDER — OXYCODONE-ACETAMINOPHEN 5-325 MG PO TABS: 1.0000 | ORAL_TABLET | Freq: Four times a day (QID) | ORAL | Status: DC | PRN

## 2014-09-16 MED ORDER — ONDANSETRON HCL 4 MG PO TABS: 4.0000 mg | ORAL_TABLET | Freq: Four times a day (QID) | ORAL | Status: DC

## 2014-09-16 MED ORDER — CIPROFLOXACIN HCL 500 MG PO TABS: 500.0000 mg | ORAL_TABLET | Freq: Two times a day (BID) | ORAL | Status: DC

## 2014-09-16 NOTE — ED Notes (Signed)
Bed: DV76 Expected date:  Expected time:  Means of arrival:  Comments: EMS 59yo F abdpain

## 2014-09-16 NOTE — ED Provider Notes (Addendum)
CSN: [id number]     Arrival date & time 09/16/14  0439 History   First MD Initiated Contact with Patient 09/16/14 [phone]     Chief Complaint  Patient presents with  . Abdominal Pain     (Consider location/radiation/quality/duration/timing/severity/associated sxs/prior Treatment) HPI Comments: Pt comes in with cc of abd pain. Pt has no significant medical hx or surgical hx. Comes in with abd pain that started yday. Pain is right sided, and constant with intermittent worsening. There is associated nausea and emesis. Pain is non radiating and is sharp. Pt has no fevers, chills. No hx of pelvic disorders. No hx of renal stones.    ROS 10 Systems reviewed and are negative for acute change except as noted in the HPI.     Patient is a 59 y.o. female presenting with abdominal pain. The history is provided by the patient.  Abdominal Pain Associated symptoms: nausea and vomiting     Past Medical History  Diagnosis Date  . Depression   . Arthritis   . Heart murmur     as child  . Asthma     when pregnant  . Anxiety   . Constipation    Past Surgical History  Procedure Laterality Date  . Knee arthroscopy Right     x 2  . Hemorrhoid surgery  1984  . Tonsillectomy    . Wrist surgery     Family History  Problem Relation Age of Onset  . Ovarian cancer Mother 43  . Hyperlipidemia Mother   . Alcohol abuse Father   . Hyperlipidemia Father   . Colon cancer Neg Hx   . Bipolar disorder Daughter   . Leukemia Paternal Grandmother    History  Substance Use Topics  . Smoking status: Never Smoker   . Smokeless tobacco: Never Used  . Alcohol Use: Yes     Comment: very rare   OB History    Gravida Para Term Preterm AB TAB SAB Ectopic Multiple Living   7 2 2  4  1   2      Review of Systems  Gastrointestinal: Positive for nausea, vomiting and abdominal pain.  All other systems reviewed and are negative.     Allergies  Review of patient's allergies indicates no known  allergies.  Home Medications   Prior to Admission medications   Medication Sig Start Date End Date Taking? Authorizing Provider  buPROPion (WELLBUTRIN XL) 150 MG 24 hr tablet TAKE 1 TABLET (150 MG TOTAL) BY MOUTH DAILY. 12/06/13  Yes Brook E Yisroel Ramming, MD  CELEBREX 200 MG capsule Take 200 mg by mouth daily.  06/05/12  Yes Historical Provider, MD  estradiol (ESTRACE) 1 MG tablet TAKE 1 TABLET (1 MG TOTAL) BY MOUTH DAILY.   Yes Brook Oletta Lamas, MD  ibuprofen (ADVIL,MOTRIN) 200 MG tablet Take 400 mg by mouth every 6 (six) hours as needed for moderate pain.    Yes Historical Provider, MD  Multiple Vitamins-Minerals (MULTIVITAMIN WITH MINERALS) tablet Take 1 tablet by mouth daily.   Yes Historical Provider, MD  naproxen sodium (ANAPROX) 220 MG tablet Take 220 mg by mouth daily as needed.   Yes Historical Provider, MD  oxyCODONE-acetaminophen (PERCOCET/ROXICET) 5-325 MG per tablet Take 1-2 tablets by mouth every 4 (four) hours as needed. 07/26/14  Yes Historical Provider, MD  progesterone (PROMETRIUM) 100 MG capsule TAKE 1 CAPSULE (100 MG TOTAL) BY MOUTH DAILY. 12/17/13  Yes Brook Oletta Lamas, MD  traMADol (ULTRAM) 50 MG  tablet Take 1 tablet by mouth every 4 (four) hours as needed. 08/18/14  Yes Historical Provider, MD  zolpidem (AMBIEN) 10 MG tablet Take 1 tablet (10 mg total) by mouth at bedtime as needed for sleep. Patient not taking: Reported on 09/16/2014 05/23/11 06/22/11  Gay Filler Copland, MD  zolpidem (AMBIEN) 10 MG tablet Take 0.5 tablets (5 mg total) by mouth at bedtime as needed for sleep. Dosage for females now limited to 5 mg a day Patient not taking: Reported on 09/16/2014 07/01/12   Gay Filler Copland, MD   BP 132/72 mmHg  Pulse 77  Temp(Src) 97.8 F (36.6 C) (Oral)  Resp 24  SpO2 96%  LMP 04/22/2006 Physical Exam  Constitutional: She is oriented to person, place, and time. She appears well-developed and well-nourished.  HENT:  Head: Normocephalic and atraumatic.   Eyes: EOM are normal. Pupils are equal, round, and reactive to light.  Neck: Neck supple.  Cardiovascular: Normal rate, regular rhythm and normal heart sounds.   No murmur heard. Pulmonary/Chest: Effort normal. No respiratory distress.  Abdominal: Soft. She exhibits no distension. There is tenderness. There is no rebound and no guarding.  Right lateral and upper quadrant tenderness. Neg Murphy's, neg Mcburneys.   Neurological: She is alert and oriented to person, place, and time.  Skin: Skin is warm and dry.  Nursing note and vitals reviewed.   ED Course  Procedures (including critical care time) Labs Review Labs Reviewed  CBC WITH DIFFERENTIAL/PLATELET - Abnormal; Notable for the following:    WBC 12.3 (*)    Platelets 458 (*)    Neutrophils Relative % 79 (*)    Neutro Abs 9.7 (*)    All other components within normal limits  COMPREHENSIVE METABOLIC PANEL  LIPASE, BLOOD  URINALYSIS, ROUTINE W REFLEX MICROSCOPIC (NOT AT [facility])    Imaging Review No results found.   EKG Interpretation None       [place] is normal. Pt has improved pain, but not pain free. Still has R lateral pain. Will get CT scan. Will sign out f/u to Dr. Winfred Leeds.   MDM   Final diagnoses:  None    DDx includes: Pancreatitis Hepatobiliary pathology including cholecystitis Gastritis/PUD SBO Intra abdominal abscess Thrombosis Mesenteric ischemia Diverticulitis Peritonitis Appendicitis Hernia Nephrolithiasis Pyelonephritis UTI/Cystitis Ovarian cyst TOA Ectopic pregnancy   Pt comes in with abd pain. Pain is right sided - favoring in the upper quadrant, but with neg Murphy's sign. Neg Mcburneys as well. She has no pelvic disorder and pretest probability for ovarian torsion is low.  Will start with RUQ [place] and reassess.    Varney Biles, MD 09/16/14 4431  Varney Biles, MD 09/16/14 [phone]

## 2014-09-16 NOTE — ED Notes (Signed)
Per [facility] - RLQ abd pain x1 hr, admits to nausea - denies vomiting/diarrhea.

## 2014-09-16 NOTE — Discharge Instructions (Signed)
Return to the ED with any concerns including fever/chills, vomiting and not able to keep down liquids, pain not controlled by pain medications, decreased level of alertness/lethargy, or any other alarming symptoms

## 2014-09-18 LAB — URINE CULTURE

## 2015-01-24 ENCOUNTER — Other Ambulatory Visit: Payer: Self-pay | Admitting: Obstetrics and Gynecology

## 2015-01-24 DIAGNOSIS — R928 Other abnormal and inconclusive findings on diagnostic imaging of breast: Secondary | ICD-10-CM

## 2015-02-03 ENCOUNTER — Ambulatory Visit
Admission: RE | Admit: 2015-02-03 | Discharge: 2015-02-03 | Disposition: A | Discharge status: Home / Self Care | Payer: BC Managed Care – PPO | Source: Ambulatory Visit | Attending: Obstetrics and Gynecology | Admitting: Obstetrics and Gynecology

## 2015-02-03 DIAGNOSIS — R928 Other abnormal and inconclusive findings on diagnostic imaging of breast: Secondary | ICD-10-CM

## 2015-04-11 ENCOUNTER — Other Ambulatory Visit: Payer: Self-pay | Admitting: Obstetrics and Gynecology

## 2015-06-28 ENCOUNTER — Ambulatory Visit (INDEPENDENT_AMBULATORY_CARE_PROVIDER_SITE_OTHER): Payer: BC Managed Care – PPO | Admitting: Emergency Medicine

## 2015-06-28 VITALS — BP 138/78 | HR 84 | Temp 98.2°F | Resp 18 | Ht 59.75 in | Wt 183.8 lb

## 2015-06-28 DIAGNOSIS — R51 Headache: Secondary | ICD-10-CM | POA: Diagnosis not present

## 2015-06-28 DIAGNOSIS — B349 Viral infection, unspecified: Secondary | ICD-10-CM

## 2015-06-28 DIAGNOSIS — R509 Fever, unspecified: Secondary | ICD-10-CM

## 2015-06-28 DIAGNOSIS — R519 Headache, unspecified: Secondary | ICD-10-CM

## 2015-06-28 LAB — POCT CBC
Granulocyte percent: 47.6 %G (ref 37–80)
HCT, POC: 37.8 % (ref 37.7–47.9)
Hemoglobin: 13.4 g/dL (ref 12.2–16.2)
Lymph, poc: 3.4 (ref 0.6–3.4)
MCH, POC: 32.4 pg — AB (ref 27–31.2)
MCHC: 35.4 g/dL (ref 31.8–35.4)
MCV: 91.6 fL (ref 80–97)
MID (cbc): 0.6 (ref 0–0.9)
MPV: 6.2 fL (ref 0–99.8)
POC Granulocyte: 3.7 (ref 2–6.9)
POC LYMPH PERCENT: 44 %L (ref 10–50)
POC MID %: 8.4 %M (ref 0–12)
Platelet Count, POC: 390 10*3/uL (ref 142–424)
RBC: 4.12 M/uL (ref 4.04–5.48)
RDW, POC: 13.2 %
WBC: 7.7 10*3/uL (ref 4.6–10.2)

## 2015-06-28 LAB — POCT INFLUENZA A/B
INFLUENZA B, POC: NEGATIVE
Influenza A, POC: NEGATIVE

## 2015-06-28 LAB — POCT RAPID STREP A (OFFICE): RAPID STREP A SCREEN: NEGATIVE

## 2015-06-28 MED ORDER — OSELTAMIVIR PHOSPHATE 75 MG PO CAPS: 75.0000 mg | ORAL_CAPSULE | Freq: Two times a day (BID) | ORAL | Status: DC

## 2015-06-28 NOTE — Progress Notes (Addendum)
By signing my name below, I, Moises Blood, attest that this documentation has been prepared under the direction and in the presence of Arlyss Queen, MD. Electronically Signed: Moises Blood, [place]. 06/28/2015 , 2:04 PM .  Patient was seen in room 4 .  Chief Complaint:  Chief Complaint  Patient presents with  . Fever    100 x this morning  . Shortness of Breath  . Fatigue    body aches    HPI: Ebony Stanley is a 60 y.o. female who has allergic rhinitis reports to [facility] today complaining of flu-like symptoms. Pt reports that she initially started with a headache yesterday. She took ibuprofen for some relief, but continued to feel fatigue. She went to sleep and when she woke up this morning, she had chills and felt feverish (tmax 100). She took 2 ibuprofen and went to work. After a few hours, she informs having myalgia, describes it "feeling like I was hit by a train". She also notes intermittent cough. She denies receiving flu shot this year.   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   Past Medical History  Diagnosis Date  . Depression   . Arthritis   . Heart murmur     as child  . Asthma     when pregnant  . Anxiety   . Constipation    Past Surgical History  Procedure Laterality Date  . Knee arthroscopy Right     x 2  . Hemorrhoid surgery  1984  . Tonsillectomy    . Wrist surgery     Social History   Social History  . Marital Status: Single    Spouse Name: N/A  . Number of Children: 2  . Years of Education: N/A   Occupational History  . Consulting civil engineer    Social History Main Topics  . Smoking status: Never Smoker   . Smokeless tobacco: Never Used  . Alcohol Use: Yes     Comment: very rare  . Drug Use: No  . Sexual Activity: Yes    Birth Control/ Protection: Post-menopausal     Comment: G8- has 2 childen, 1sab, 4tab   Other Topics Concern  . None   Social History Narrative    Family History  Problem Relation Age of Onset  . Ovarian cancer Mother 42  . Hyperlipidemia Mother   . Alcohol abuse Father   . Hyperlipidemia Father   . Colon cancer Neg Hx   . Bipolar disorder Daughter   . Leukemia Paternal Grandmother    No Known Allergies Prior to Admission medications   Medication Sig Start Date End Date Taking? Authorizing Provider  buPROPion (WELLBUTRIN XL) 150 MG 24 hr tablet TAKE 1 TABLET (150 MG TOTAL) BY MOUTH DAILY. 12/06/13  Yes Brook E Yisroel Ramming, MD  CELEBREX 200 MG capsule Take 200 mg by mouth daily.  06/05/12  Yes Historical Provider, MD  estradiol (ESTRACE) 1 MG tablet TAKE 1 TABLET (1 MG TOTAL) BY MOUTH DAILY.   Yes Brook Oletta Lamas, MD  ibuprofen (ADVIL,MOTRIN) 200 MG tablet Take 400 mg by mouth every 6 (six) hours as needed for moderate pain.    Yes Historical Provider, MD  Multiple Vitamins-Minerals (MULTIVITAMIN WITH MINERALS) tablet Take 1 tablet by mouth daily.   Yes Historical Provider, MD  naproxen sodium (ANAPROX) 220 MG tablet Take 220 mg by mouth daily as needed.   Yes Historical Provider, MD  oxyCODONE-acetaminophen (PERCOCET/ROXICET) 5-325 MG per tablet Take 1-2 tablets by mouth every 6 (six) hours as needed for severe pain. 09/16/14  Yes Alfonzo Beers, MD  progesterone (PROMETRIUM) 100 MG capsule TAKE 1 CAPSULE (100 MG TOTAL) BY MOUTH DAILY. 12/17/13  Yes [place], MD  traMADol (ULTRAM) 50 MG tablet Take 1 tablet by mouth every 4 (four) hours as needed. 08/18/14  Yes Historical Provider, MD  zolpidem (AMBIEN) 10 MG tablet Take 1 tablet (10 mg total) by mouth at bedtime as needed for sleep. Patient not taking: Reported on 09/16/2014 05/23/11 06/22/11  Gay Filler Copland, MD  zolpidem (AMBIEN) 10 MG tablet Take 0.5 tablets (5 mg total) by mouth at bedtime as needed for sleep. Dosage for females now limited to 5 mg a day Patient not taking: Reported on 09/16/2014 07/01/12   Gay Filler Copland, MD     ROS:   Constitutional: negative for night sweats, weight changes; positive for fever, fatigue, chills HEENT: negative for vision changes, hearing loss, congestion, ST, epistaxis, or sinus pressure; positive for rhinorrhea Cardiovascular: negative for chest pain or palpitations Respiratory: negative for hemoptysis, wheezing; positive for cough, shortness of breath Abdominal: negative for abdominal pain, nausea, vomiting, diarrhea, or constipation Dermatological: negative for rash Musc: positive for myalgia Neurologic: negative for dizziness, or syncope; positive for headache All other systems reviewed and are otherwise negative with the exception to those above and in the HPI.  PHYSICAL EXAM: Filed Vitals:   06/28/15 1347  BP: 138/78  Pulse: 84  Temp: 98.2 F (36.8 C)  Resp: 18   Body mass index is 36.18 kg/(m^2).   General: Alert, no acute distress, Ill not toxic HEENT:  Normocephalic, atraumatic, oropharynx patent; throat slightly red Eye: EOMI, PEERLDC Cardiovascular:  Regular rate and rhythm, no rubs murmurs or gallops.  No Carotid bruits, radial pulse intact. No pedal edema.  Respiratory: Clear to auscultation bilaterally.  No wheezes, rales, or rhonchi.  No cyanosis, no use of accessory musculature Abdominal: No organomegaly, abdomen is soft and non-tender, positive bowel sounds. No masses. Musculoskeletal: Gait intact. No edema, tenderness Skin: No rashes. Neurologic: Facial musculature symmetric. Psychiatric: Patient acts appropriately throughout our interaction.  Lymphatic: No cervical or submandibular lymphadenopathy Genitourinary/Anorectal: No acute findings  LABS: Results for orders placed or performed in visit on 06/28/15  POCT rapid strep A  Result Value Ref Range   Rapid Strep A Screen Negative Negative  POCT CBC  Result Value Ref Range   WBC 7.7 4.6 - 10.2 K/uL   Lymph, poc 3.4 0.6 - 3.4   POC LYMPH PERCENT 44.0 10 - 50 %L   MID (cbc) 0.6 0 - 0.9   POC MID %  8.4 0 - 12 %M   POC Granulocyte 3.7 2 - 6.9   Granulocyte percent 47.6 37 - 80 %G   RBC 4.12 4.04 - 5.48 M/uL   Hemoglobin 13.4 12.2 - 16.2 g/dL   HCT, POC 37.8 37.7 - 47.9 %   MCV 91.6 80 - 97 fL   MCH, POC 32.4 (A) 27 - 31.2 pg   MCHC 35.4 31.8 - 35.4 g/dL   RDW, POC 13.2 %   Platelet Count, POC 390 142 - 424 K/uL   MPV 6.2 0 - 99.8 fL   Flu negative Meds ordered this encounter  Medications  . oseltamivir (TAMIFLU) 75 MG capsule    Sig: Take 1 capsule (75 mg total) by mouth 2 (two) times daily.    Dispense:  10  capsule    Refill:  0   EKG/XRAY:     ASSESSMENT/PLAN:  patient symptoms are most consistent with influenza. Will treat wtih Tamiflu. Patient .given a note for work.I personally performed the services described in this documentation, which was scribed in my presence. The recorded information has been reviewed and is accurate.   Gross sideeffects, risk and benefits, and alternatives of medications d/w patient. Patient is aware that all medications have potential sideeffects and we are unable to predict every sideeffect or drug-drug interaction that may occur.  Arlyss Queen MD 06/28/2015 2:04 PM

## 2015-06-28 NOTE — Patient Instructions (Signed)

## 2015-06-29 ENCOUNTER — Telehealth: Payer: Self-pay

## 2015-06-29 NOTE — Telephone Encounter (Signed)
Pt states she is seeing spots and having a hard time focusing and has a headache-she thinks this might be a reaction to the tamiflu -is it ok to take ibuprophen   Please call [phone]

## 2015-06-30 NOTE — Telephone Encounter (Signed)
Spoke with pt, advised message from [place]. Pt understood. She is doing well and does not have the symptoms anymore. She will come in tomorrow if she is not better.

## 2015-06-30 NOTE — Telephone Encounter (Signed)
BP and creatinine are normal.  Ibuprofen should be fine.  If she is really worried RTC as there may be something else at clinic we could do to help her feel better. Philis Fendt, MS, PA-C 8:31 AM, 06/30/2015

## 2015-07-18 ENCOUNTER — Encounter (HOSPITAL_COMMUNITY): Payer: Self-pay | Admitting: Emergency Medicine

## 2015-07-18 ENCOUNTER — Inpatient Hospital Stay (HOSPITAL_COMMUNITY)
Admission: EM | Admit: 2015-07-18 | Discharge: 2015-07-21 | DRG: 915 | Disposition: A | Discharge status: Home / Self Care | Payer: Worker's Compensation | Attending: Internal Medicine | Admitting: Internal Medicine

## 2015-07-18 DIAGNOSIS — T413X5A Adverse effect of local anesthetics, initial encounter: Secondary | ICD-10-CM | POA: Diagnosis present

## 2015-07-18 DIAGNOSIS — T886XXA Anaphylactic reaction due to adverse effect of correct drug or medicament properly administered, initial encounter: Secondary | ICD-10-CM | POA: Diagnosis present

## 2015-07-18 DIAGNOSIS — J9601 Acute respiratory failure with hypoxia: Secondary | ICD-10-CM | POA: Diagnosis present

## 2015-07-18 DIAGNOSIS — E669 Obesity, unspecified: Secondary | ICD-10-CM | POA: Diagnosis present

## 2015-07-18 DIAGNOSIS — Y848 Other medical procedures as the cause of abnormal reaction of the patient, or of later complication, without mention of misadventure at the time of the procedure: Secondary | ICD-10-CM | POA: Diagnosis present

## 2015-07-18 DIAGNOSIS — F329 Major depressive disorder, single episode, unspecified: Secondary | ICD-10-CM | POA: Diagnosis present

## 2015-07-18 DIAGNOSIS — Y9259 Other trade areas as the place of occurrence of the external cause: Secondary | ICD-10-CM

## 2015-07-18 DIAGNOSIS — T882XXA Shock due to anesthesia, initial encounter: Secondary | ICD-10-CM | POA: Diagnosis present

## 2015-07-18 DIAGNOSIS — E876 Hypokalemia: Secondary | ICD-10-CM | POA: Diagnosis present

## 2015-07-18 DIAGNOSIS — I959 Hypotension, unspecified: Secondary | ICD-10-CM | POA: Diagnosis present

## 2015-07-18 DIAGNOSIS — M1990 Unspecified osteoarthritis, unspecified site: Secondary | ICD-10-CM | POA: Diagnosis present

## 2015-07-18 DIAGNOSIS — T8059XA Anaphylactic reaction due to other serum, initial encounter: Secondary | ICD-10-CM | POA: Diagnosis present

## 2015-07-18 DIAGNOSIS — G4700 Insomnia, unspecified: Secondary | ICD-10-CM | POA: Diagnosis present

## 2015-07-18 DIAGNOSIS — Z9889 Other specified postprocedural states: Secondary | ICD-10-CM | POA: Diagnosis not present

## 2015-07-18 DIAGNOSIS — Y92230 Patient room in hospital as the place of occurrence of the external cause: Secondary | ICD-10-CM | POA: Diagnosis not present

## 2015-07-18 DIAGNOSIS — Z6837 Body mass index (BMI) 37.0-37.9, adult: Secondary | ICD-10-CM

## 2015-07-18 DIAGNOSIS — T380X5A Adverse effect of glucocorticoids and synthetic analogues, initial encounter: Secondary | ICD-10-CM | POA: Diagnosis present

## 2015-07-18 DIAGNOSIS — R739 Hyperglycemia, unspecified: Secondary | ICD-10-CM | POA: Diagnosis not present

## 2015-07-18 DIAGNOSIS — T782XXA Anaphylactic shock, unspecified, initial encounter: Secondary | ICD-10-CM | POA: Diagnosis present

## 2015-07-18 DIAGNOSIS — F419 Anxiety disorder, unspecified: Secondary | ICD-10-CM | POA: Diagnosis present

## 2015-07-18 LAB — CBC WITH DIFFERENTIAL/PLATELET
BASOS ABS: 0 10*3/uL (ref 0.0–0.1)
Basophils Relative: 0 %
EOS PCT: 0 %
Eosinophils Absolute: 0 10*3/uL (ref 0.0–0.7)
HEMATOCRIT: 39.4 % (ref 36.0–46.0)
Hemoglobin: 13.7 g/dL (ref 12.0–15.0)
LYMPHS ABS: 2.5 10*3/uL (ref 0.7–4.0)
LYMPHS PCT: 10 %
MCH: 31.4 pg (ref 26.0–34.0)
MCHC: 34.8 g/dL (ref 30.0–36.0)
MCV: 90.4 fL (ref 78.0–100.0)
MONO ABS: 0.6 10*3/uL (ref 0.1–1.0)
MONOS PCT: 2 %
NEUTROS ABS: 21.6 10*3/uL — AB (ref 1.7–7.7)
Neutrophils Relative %: 88 %
Platelets: 506 10*3/uL — ABNORMAL HIGH (ref 150–400)
RBC: 4.36 MIL/uL (ref 3.87–5.11)
RDW: 13.1 % (ref 11.5–15.5)
WBC: 24.8 10*3/uL — ABNORMAL HIGH (ref 4.0–10.5)

## 2015-07-18 LAB — BASIC METABOLIC PANEL
ANION GAP: 15 (ref 5–15)
BUN: 8 mg/dL (ref 6–20)
CALCIUM: 7.4 mg/dL — AB (ref 8.9–10.3)
CO2: 16 mmol/L — AB (ref 22–32)
Chloride: 109 mmol/L (ref 101–111)
Creatinine, Ser: 0.99 mg/dL (ref 0.44–1.00)
GFR calc Af Amer: >60 mL/min (ref >60)
GFR calc non Af Amer: >60 mL/min (ref >60)
GLUCOSE: 223 mg/dL — AB (ref 65–99)
Potassium: 2.7 mmol/L — CL (ref 3.5–5.1)
Sodium: 140 mmol/L (ref 135–145)

## 2015-07-18 MED ORDER — ASPIRIN 81 MG PO CHEW
324.0000 mg | CHEWABLE_TABLET | ORAL | Status: AC
  Administered 2015-07-18: 324 mg via ORAL
  Filled 2015-07-18: qty 4

## 2015-07-18 MED ORDER — SODIUM CHLORIDE 0.9 % IV BOLUS (SEPSIS)
1000.0000 mL | Freq: Once | INTRAVENOUS | Status: AC
  Administered 2015-07-18: 1000 mL via INTRAVENOUS

## 2015-07-18 MED ORDER — BUPROPION HCL ER (XL) 150 MG PO TB24: 150.0000 mg | ORAL_TABLET | Freq: Every day | ORAL | Status: DC

## 2015-07-18 MED ORDER — HEPARIN SODIUM (PORCINE) 5000 UNIT/ML IJ SOLN
5000.0000 [IU] | Freq: Three times a day (TID) | INTRAMUSCULAR | Status: DC
  Administered 2015-07-18 – 2015-07-19 (×2): 5000 [IU] via SUBCUTANEOUS
  Filled 2015-07-18 (×3): qty 1

## 2015-07-18 MED ORDER — POTASSIUM CHLORIDE 20 MEQ/15ML (10%) PO SOLN
40.0000 meq | ORAL | Status: AC
Start: 2015-07-18 — End: 2015-07-19
  Administered 2015-07-18 – 2015-07-19 (×2): 40 meq via ORAL
  Filled 2015-07-18 (×2): qty 30

## 2015-07-18 MED ORDER — POTASSIUM CHLORIDE 10 MEQ/100ML IV SOLN
10.0000 meq | INTRAVENOUS | Status: DC
  Filled 2015-07-18: qty 100

## 2015-07-18 MED ORDER — EPINEPHRINE HCL 1 MG/ML IJ SOLN: 0.5000 ug/min | INTRAVENOUS | Status: DC

## 2015-07-18 MED ORDER — FAMOTIDINE IN NACL 20-0.9 MG/50ML-% IV SOLN
20.0000 mg | Freq: Once | INTRAVENOUS | Status: AC
  Administered 2015-07-18: 20 mg via INTRAVENOUS
  Filled 2015-07-18: qty 50

## 2015-07-18 MED ORDER — DIPHENHYDRAMINE HCL 50 MG/ML IJ SOLN
50.0000 mg | Freq: Once | INTRAMUSCULAR | Status: AC
  Administered 2015-07-18: 50 mg via INTRAVENOUS
  Filled 2015-07-18: qty 1

## 2015-07-18 MED ORDER — ASPIRIN 300 MG RE SUPP: 300.0000 mg | RECTAL | Status: AC

## 2015-07-18 MED ORDER — SODIUM CHLORIDE 0.9 % IV SOLN: 250.0000 mL | INTRAVENOUS | Status: DC | PRN

## 2015-07-18 MED ORDER — BUPROPION HCL ER (XL) 150 MG PO TB24
150.0000 mg | ORAL_TABLET | Freq: Every day | ORAL | Status: DC
  Administered 2015-07-19 – 2015-07-21 (×3): 150 mg via ORAL
  Filled 2015-07-18 (×5): qty 1

## 2015-07-18 MED ORDER — EPINEPHRINE HCL 1 MG/ML IJ SOLN
0.5000 ug/min | INTRAMUSCULAR | Status: DC
  Administered 2015-07-18: 5 ug/min via INTRAVENOUS
  Filled 2015-07-18 (×2): qty 4

## 2015-07-18 MED ORDER — POTASSIUM CHLORIDE 10 MEQ/100ML IV SOLN: 10.0000 meq | INTRAVENOUS | Status: DC

## 2015-07-18 MED ORDER — METHYLPREDNISOLONE SODIUM SUCC 40 MG IJ SOLR
40.0000 mg | Freq: Four times a day (QID) | INTRAMUSCULAR | Status: DC
Start: 2015-07-18 — End: 2015-07-19
  Administered 2015-07-18 – 2015-07-19 (×2): 40 mg via INTRAVENOUS
  Filled 2015-07-18 (×4): qty 1

## 2015-07-18 MED ORDER — LOPERAMIDE HCL 2 MG PO CAPS
4.0000 mg | ORAL_CAPSULE | ORAL | Status: DC | PRN
Start: 2015-07-18 — End: 2015-07-20
  Filled 2015-07-18: qty 2

## 2015-07-18 MED ORDER — FAMOTIDINE IN NACL 20-0.9 MG/50ML-% IV SOLN
20.0000 mg | Freq: Two times a day (BID) | INTRAVENOUS | Status: DC
  Administered 2015-07-18 – 2015-07-19 (×2): 20 mg via INTRAVENOUS
  Filled 2015-07-18 (×4): qty 50

## 2015-07-18 MED ORDER — DIPHENHYDRAMINE HCL 50 MG/ML IJ SOLN
50.0000 mg | Freq: Four times a day (QID) | INTRAMUSCULAR | Status: DC
  Administered 2015-07-18 – 2015-07-19 (×3): 50 mg via INTRAVENOUS
  Filled 2015-07-18 (×5): qty 1

## 2015-07-18 MED ORDER — DIPHENHYDRAMINE HCL 50 MG/ML IJ SOLN: 50.0000 mg | Freq: Four times a day (QID) | INTRAMUSCULAR | Status: DC

## 2015-07-18 MED ORDER — FAMOTIDINE 40 MG/5ML PO SUSR: 20.0000 mg | Freq: Two times a day (BID) | ORAL | Status: DC

## 2015-07-18 MED ORDER — SODIUM CHLORIDE 0.9 % IV BOLUS (SEPSIS)
2000.0000 mL | Freq: Once | INTRAVENOUS | Status: AC
  Administered 2015-07-18: 1000 mL via INTRAVENOUS

## 2015-07-18 MED ORDER — METHYLPREDNISOLONE SODIUM SUCC 125 MG IJ SOLR
125.0000 mg | Freq: Once | INTRAMUSCULAR | Status: AC
  Administered 2015-07-18: 125 mg via INTRAVENOUS
  Filled 2015-07-18: qty 2

## 2015-07-18 NOTE — Progress Notes (Signed)
[place] Progress Note Patient Name: Ebony Stanley DOB: [birth year] MRN: [id number]   Date of Service  07/18/2015  HPI/Events of Note  Patient refused IV K due to pain and fear about reactions.   eICU Interventions   Will change to 80 meg of po potassium in divided doses, repeat K in am.         Laverle Hobby 07/18/2015, 10:45 PM

## 2015-07-18 NOTE — Progress Notes (Signed)
CRITICAL VALUE ALERT  Critical value received:  K+ 2.7  Date of notification:  3/28  Time of notification:  2052  Critical value read back:Yes.    Nurse who received alert:  Laray Anger  MD notified (1st page):  Warren Lacy MD  Time of first page:  2052  MD notified (2nd page):  Time of second page:  Responding MD:  Warren Lacy MD  Time MD responded:  2053

## 2015-07-18 NOTE — ED Provider Notes (Signed)
CSN: [id number]     Arrival date & time 07/18/15  1727 History   First MD Initiated Contact with Patient 07/18/15 1729     Chief Complaint  Patient presents with  . anaphylaxis   . Allergic Reaction     (Consider location/radiation/quality/duration/timing/severity/associated sxs/prior Treatment) Patient is a 60 y.o. female presenting with allergic reaction and general illness. The history is provided by the patient.  Allergic Reaction Presenting symptoms: difficulty breathing, itching and wheezing   Severity:  Severe Prior allergic episodes:  No prior episodes Context: medications   Relieved by:  Nothing Worsened by:  Nothing tried Ineffective treatments:  None tried Illness Severity:  Severe Onset quality:  Sudden Duration:  1 hour Timing:  Constant Progression:  Unchanged Chronicity:  New Associated symptoms: wheezing   Associated symptoms: no chest pain, no congestion, no fever, no headaches, no myalgias, no nausea, no rhinorrhea, no shortness of breath and no vomiting    60 yo F with a chief complaint of an acute allergic reaction. Patient was in her orthopedic office for a joint injection. She got lidocaine and Kenalog. After which patient had diffuse hives felt like she was going to pass out and vomited. Blood pressure was checked at that time it was in the 60s. Patient was given 0.3 mg of IM epi. Patient transiently got better and then required another shot. EMS transferred her here. She thinks that she's feeling mildly better. It seems to have a rash and some wheezing. Has never had a allergic reaction the past.  Past Medical History  Diagnosis Date  . Depression   . Arthritis   . Heart murmur     as child  . Asthma     when pregnant  . Anxiety   . Constipation    Past Surgical History  Procedure Laterality Date  . Knee arthroscopy Right     x 2  . Hemorrhoid surgery  1984  . Tonsillectomy    . Wrist surgery     Family History  Problem Relation Age of Onset   . Ovarian cancer Mother 60  . Hyperlipidemia Mother   . Alcohol abuse Father   . Hyperlipidemia Father   . Colon cancer Neg Hx   . Bipolar disorder Daughter   . Leukemia Paternal Grandmother    Social History  Substance Use Topics  . Smoking status: Never Smoker   . Smokeless tobacco: Never Used  . Alcohol Use: Yes     Comment: very rare   OB History    Gravida Para Term Preterm AB TAB SAB Ectopic Multiple Living   7 2 2  4  1   2      Review of Systems  Constitutional: Negative for fever and chills.  HENT: Negative for congestion and rhinorrhea.   Eyes: Negative for redness and visual disturbance.  Respiratory: Positive for wheezing. Negative for shortness of breath.   Cardiovascular: Negative for chest pain and palpitations.  Gastrointestinal: Negative for nausea and vomiting.  Genitourinary: Negative for dysuria and urgency.  Musculoskeletal: Negative for myalgias and arthralgias.  Skin: Positive for itching. Negative for pallor and wound.  Neurological: Negative for dizziness and headaches.      Allergies  Kenalog and Lidocaine  Home Medications   Prior to Admission medications   Medication Sig Start Date End Date Taking? Authorizing Provider  buPROPion (WELLBUTRIN XL) 150 MG 24 hr tablet TAKE 1 TABLET (150 MG TOTAL) BY MOUTH DAILY. 12/06/13  Yes Brook Oletta Lamas, MD  CELEBREX 200 MG capsule Take 200 mg by mouth daily.  06/05/12  Yes Historical Provider, MD  estradiol (ESTRACE) 1 MG tablet TAKE 1 TABLET (1 MG TOTAL) BY MOUTH DAILY.   Yes Brook Oletta Lamas, MD  ibuprofen (ADVIL,MOTRIN) 200 MG tablet Take 400 mg by mouth every 6 (six) hours as needed for moderate pain.    Yes Historical Provider, MD  Multiple Vitamins-Minerals (MULTIVITAMIN WITH MINERALS) tablet Take 1 tablet by mouth daily.   Yes Historical Provider, MD  naproxen sodium (ANAPROX) 220 MG tablet Take 220 mg by mouth daily as needed.   Yes Historical Provider, MD  oxyCODONE-acetaminophen  (PERCOCET/ROXICET) 5-325 MG per tablet Take 1-2 tablets by mouth every 6 (six) hours as needed for severe pain. 09/16/14  Yes Alfonzo Beers, MD  progesterone (PROMETRIUM) 100 MG capsule TAKE 1 CAPSULE (100 MG TOTAL) BY MOUTH DAILY. 12/17/13  Yes Brook E Yisroel Ramming, MD  SENNA CO 2 tablets by Combination route daily.   Yes Historical Provider, MD  traMADol (ULTRAM) 50 MG tablet Take 1 tablet by mouth every 4 (four) hours as needed. 08/18/14  Yes Historical Provider, MD  zolpidem (AMBIEN) 10 MG tablet Take 1 tablet (10 mg total) by mouth at bedtime as needed for sleep. Patient not taking: Reported on 09/16/2014 05/23/11 06/22/11  Gay Filler Copland, MD   BP 90/53 mmHg  Pulse 108  Resp 18  Ht 5' (1.524 m)  Wt 182 lb (82.555 kg)  BMI 35.54 kg/m2  SpO2 96%  LMP 04/22/2006 Physical Exam  Constitutional: She is oriented to person, place, and time. She appears well-developed and well-nourished. No distress.  HENT:  Head: Normocephalic and atraumatic.  Eyes: EOM are normal. Pupils are equal, round, and reactive to light.  Neck: Normal range of motion. Neck supple.  Cardiovascular: Normal rate and regular rhythm.  Exam reveals no gallop and no friction rub.   No murmur heard. Pulmonary/Chest: She is in respiratory distress. She has wheezes. She has no rales.  Tachypnea  Abdominal: Soft. She exhibits no distension. There is no tenderness. There is no rebound and no guarding.  Musculoskeletal: She exhibits no edema or tenderness.  Neurological: She is alert and oriented to person, place, and time.  Skin: Skin is warm and dry. Rash noted. She is not diaphoretic.  Diffuse hives throughout the body worse on the upper body.  Psychiatric: She has a normal mood and affect. Her behavior is normal.  Nursing note and vitals reviewed.   ED Course  Procedures (including critical care time) Labs Review Labs Reviewed  CBC WITH DIFFERENTIAL/PLATELET - Abnormal; Notable for the following:    WBC 24.8 (*)     Platelets 506 (*)    Neutro Abs 21.6 (*)    All other components within normal limits  BASIC METABOLIC PANEL - Abnormal; Notable for the following:    Potassium 2.7 (*)    CO2 16 (*)    Glucose, Bld 223 (*)    Calcium 7.4 (*)    All other components within normal limits  CBC  BASIC METABOLIC PANEL  MAGNESIUM  PHOSPHORUS    Imaging Review No results found. I have personally reviewed and evaluated these images and lab results as part of my medical decision-making.   EKG Interpretation   Date/Time:  Tuesday July 18 2015 17:31:06 EDT Ventricular Rate:  111 PR Interval:    QRS Duration: 89 QT Interval:  360 QTC Calculation: 489 R Axis:   23 Text Interpretation:  Atrial flutter with predominant 3:1 AV block Low  voltage, precordial leads Probable anteroseptal infarct, old No old  tracing to compare Confirmed by Ela Moffat MD, [place] [phone]) on 07/18/2015  5:58:31 PM      MDM   Final diagnoses:  Anaphylactic shock, initial encounter    60 yo F with anaphylaxis. Concern for anaphylactic shock initially. Was given two doses of epinephrine by EMS, benadryl without relief.  Continues to be hypotensive in the ED, had minimal response with fluids.  Given solumedrol, benadryl, pepcid.  Started on epi gtt.   CRITICAL CARE Performed by: Cecilio Asper   Total critical care time: 80 minutes  Critical care time was exclusive of separately billable procedures and treating other patients.  Critical care was necessary to treat or prevent imminent or life-threatening deterioration.  Critical care was time spent personally by me on the following activities: development of treatment plan with patient and/or surrogate as well as nursing, discussions with consultants, evaluation of patient's response to treatment, examination of patient, obtaining history from patient or surrogate, ordering and performing treatments and interventions, ordering and review of laboratory studies,  ordering and review of radiographic studies, pulse oximetry and re-evaluation of patient's condition.  The patients results and plan were reviewed and discussed.   Any x-rays performed were independently reviewed by myself.   Differential diagnosis were considered with the presenting HPI.  Medications  EPINEPHrine (ADRENALIN) 4 mg in dextrose 5 % 250 mL (0.016 mg/mL) infusion (8 mcg/min Intravenous Rate/Dose Change 07/18/15 2020)  loperamide (IMODIUM) capsule 4 mg (not administered)  0.9 %  sodium chloride infusion (not administered)  heparin injection 5,000 Units (not administered)  diphenhydrAMINE (BENADRYL) injection 50 mg (not administered)  famotidine (PEPCID) IVPB 20 mg premix (not administered)  methylPREDNISolone sodium succinate (SOLU-MEDROL) 40 mg/mL injection 40 mg (not administered)  buPROPion (WELLBUTRIN XL) 24 hr tablet 150 mg (not administered)  sodium chloride 0.9 % bolus 1,000 mL (1,000 mLs Intravenous New Bag/Given 07/18/15 1758)  diphenhydrAMINE (BENADRYL) injection 50 mg (50 mg Intravenous Given 07/18/15 1755)  famotidine (PEPCID) IVPB 20 mg premix (0 mg Intravenous Stopped 07/18/15 1910)  methylPREDNISolone sodium succinate (SOLU-MEDROL) 125 mg/2 mL injection 125 mg (125 mg Intravenous Given 07/18/15 1749)  aspirin chewable tablet 324 mg (324 mg Oral Given 07/18/15 2022)    Or  aspirin suppository 300 mg ( Rectal See Alternative 07/18/15 2022)    Filed Vitals:   07/18/15 1900 07/18/15 1930 07/18/15 2000 07/18/15 2100  BP: 109/55 93/58 97/45  90/53  Pulse: 106 104 113 108  Resp: 22 18 22 18   Height:      Weight:      SpO2: 98% 98% 98% 96%    Final diagnoses:  Anaphylactic shock, initial encounter    Admission/ observation were discussed with the admitting physician, patient and/or family and they are comfortable with the plan.     Deno Etienne, DO 07/18/15 2125

## 2015-07-18 NOTE — H&P (Signed)
PULMONARY / CRITICAL CARE MEDICINE   Name: Ebony Stanley MRN: [id number] DOB: 10/18/1955    ADMISSION DATE:  07/18/2015 CONSULTATION DATE:  07/18/2015  REFERRING MD:  EDP  CHIEF COMPLAINT:  Anaphylaxis  HISTORY OF PRESENT ILLNESS:  60 year old female with PMH as below, which includes Asthma, arthritis, and depression/anxiety. She had L rist surgery about a year ago and presented to ortho office today (3/28) for follow-up. During visit she was given join injection with lidocaine and kenalog. She left the office and began to drive home, however on her drive she felt flush, itching, tongue swelling, and dizziness. She turned around and drove back to office. EMS was called. She was given 2 doses of epi 0.3 mg IM, one dose of eavh benadryl, zofran, and albuterol. Initially in ED she was very confused and combative, and then lethargic. He condition improved with the aforementioned interventions. PCCM asked to see for admission.   PAST MEDICAL HISTORY :  She  has a past medical history of Depression; Arthritis; Heart murmur; Asthma; Anxiety; and Constipation.  PAST SURGICAL HISTORY: She  has past surgical history that includes Knee arthroscopy (Right); Hemorrhoid surgery (1984); Tonsillectomy; and Wrist surgery.  No Known Allergies  No current facility-administered medications on file prior to encounter.   Current Outpatient Prescriptions on File Prior to Encounter  Medication Sig  . buPROPion (WELLBUTRIN XL) 150 MG 24 hr tablet TAKE 1 TABLET (150 MG TOTAL) BY MOUTH DAILY.  Marland Kitchen CELEBREX 200 MG capsule Take 200 mg by mouth daily.   Marland Kitchen estradiol (ESTRACE) 1 MG tablet TAKE 1 TABLET (1 MG TOTAL) BY MOUTH DAILY.  Marland Kitchen ibuprofen (ADVIL,MOTRIN) 200 MG tablet Take 400 mg by mouth every 6 (six) hours as needed for moderate pain.   . Multiple Vitamins-Minerals (MULTIVITAMIN WITH MINERALS) tablet Take 1 tablet by mouth daily.  . naproxen sodium (ANAPROX) 220 MG tablet Take 220 mg by mouth daily as needed.  Marland Kitchen  oseltamivir (TAMIFLU) 75 MG capsule Take 1 capsule (75 mg total) by mouth 2 (two) times daily.  Marland Kitchen oxyCODONE-acetaminophen (PERCOCET/ROXICET) 5-325 MG per tablet Take 1-2 tablets by mouth every 6 (six) hours as needed for severe pain.  . progesterone (PROMETRIUM) 100 MG capsule TAKE 1 CAPSULE (100 MG TOTAL) BY MOUTH DAILY.  . traMADol (ULTRAM) 50 MG tablet Take 1 tablet by mouth every 4 (four) hours as needed.  . zolpidem (AMBIEN) 10 MG tablet Take 1 tablet (10 mg total) by mouth at bedtime as needed for sleep. (Patient not taking: Reported on 09/16/2014)  . zolpidem (AMBIEN) 10 MG tablet Take 0.5 tablets (5 mg total) by mouth at bedtime as needed for sleep. Dosage for females now limited to 5 mg a day (Patient not taking: Reported on 09/16/2014)    FAMILY HISTORY:  Her indicated that her mother is deceased. She indicated that her father is deceased. She indicated that her maternal grandmother is deceased. She indicated that her maternal grandfather is deceased. She indicated that her paternal grandmother is deceased. She indicated that her paternal grandfather is deceased. She indicated that both of her daughters are alive. She indicated that her maternal aunt is deceased. She indicated that her maternal uncle is deceased. She indicated that both of her paternal uncles are deceased.   SOCIAL HISTORY: She  reports that she has never smoked. She has never used smokeless tobacco. She reports that she drinks alcohol. She reports that she does not use illicit drugs.  REVIEW OF SYSTEMS:   Bolds are positive  Constitutional: weight loss, gain, night sweats, Fevers, chills, fatigue .  HEENT: headaches, Sore throat, tongue swelling, sneezing, nasal congestion, post nasal drip, Difficulty swallowing, Tooth/dental problems, visual complaints visual changes, ear ache  CV:  chest pain, radiates: ,Orthopnea, PND, swelling in lower extremities, dizziness, palpitations, syncope.  GI  heartburn, indigestion,  abdominal pain, nausea, vomiting, diarrhea, change in bowel habits, loss of appetite, bloody stools.  Resp: cough, productive: , hemoptysis, dyspnea, chest pain, pleuritic.  Skin: rash or itching or icterus GU: dysuria, change in color of urine, urgency or frequency. flank pain, hematuria  MS: joint pain or swelling. decreased range of motion  Psych: change in mood or affect. depression or anxiety.  Neuro: difficulty with speech, weakness, numbness, ataxia    SUBJECTIVE:    VITAL SIGNS: BP 109/55 mmHg  Pulse 106  Resp 22  Ht 5' (1.524 m)  Wt 82.555 kg (182 lb)  BMI 35.54 kg/m2  SpO2 98%  LMP 04/22/2006  HEMODYNAMICS:    VENTILATOR SETTINGS:    INTAKE / OUTPUT:    PHYSICAL EXAMINATION: General:  Obese female in NAD Neuro:  Alert, oriented, non-focal HEENT:  [place]/AT, PERRL, no MRG, minimal tongue edema. Hoarseness (improving) Cardiovascular:  RRR, no MRG Lungs:  Clear bilateral breath sounds Abdomen:  Soft, non-distended Musculoskeletal:  No acute deformity or ROM limitation Skin:  Grossly intact, hives resolved  LABS:  BMET No results for input(s): NA, K, CL, CO2, BUN, CREATININE, GLUCOSE in the last 168 hours.  Electrolytes No results for input(s): CALCIUM, MG, PHOS in the last 168 hours.  CBC No results for input(s): WBC, HGB, HCT, PLT in the last 168 hours.  Coag's No results for input(s): APTT, INR in the last 168 hours.  Sepsis Markers No results for input(s): LATICACIDVEN, PROCALCITON, O2SATVEN in the last 168 hours.  ABG No results for input(s): PHART, PCO2ART, PO2ART in the last 168 hours.  Liver Enzymes No results for input(s): AST, ALT, ALKPHOS, BILITOT, ALBUMIN in the last 168 hours.  Cardiac Enzymes No results for input(s): TROPONINI, PROBNP in the last 168 hours.  Glucose No results for input(s): GLUCAP in the last 168 hours.  Imaging No results found.   STUDIES:    CULTURES:   ANTIBIOTICS:   SIGNIFICANT  EVENTS:   LINES/TUBES:   DISCUSSION: 60 year old female presented to ED with anaphylactic shock after receiving kenalog/lidocaine injection at ortho office. Required 2 doses IM epi, benadryl, solumedrol, Pepcid. Much improved by time of initial PCCM evaluation, however, needing epi gtt. Will admit to ICU for close monitoring.  ASSESSMENT / PLAN:  CARDIOVASCULAR A:  Anaphylactic shock   P:  Telemetry monitoring Continue epinephrine gtt low dose MAP goal > 65 Scheduled pepcid, benadryl, solumedrol Close monitoring in ICU  PULMONARY A: Risk for airway compromise in setting of anaphylaxis  H/o Asthma  P:   Supplemental O2 as needed to keep SpO2 > 92%  RENAL A:   No acute issues  P:   BMP pending  GASTROINTESTINAL A:   No acute issues  P:   NPO for now except ice chips  HEMATOLOGIC A:   No acute issues  P:  Follow CBC SQ heparin for VTE ppx  INFECTIOUS A:   No acute issues  P:     ENDOCRINE A:   No acute issues   P:     NEUROLOGIC A:   Anxiety Depression  P:   Continue home wellbutrin, celebrex    FAMILY  - Updates: Family updated at  bedside by [place] family meet or Palliative Care meeting due by:  4/3   Georgann Housekeeper, AGACNP-BC [place] [facility] Pager [phone] or [phone]  07/18/2015 7:43 PM

## 2015-07-18 NOTE — ED Notes (Signed)
Pt c/o severe abd cramping.

## 2015-07-18 NOTE — ED Notes (Addendum)
To ED via [facility] from Dr. Bertis Ruddy office with an anaphylactic type reaction after receiving Kenalog with Lidocaine injection in shoulder. Pt started having itching in groin/thighs-- developed hives over trunk, became confused and combative at office, hypotensive with pressure of 66/42-- -- lower abd cramping,  Received -- 1657 -- Epi 0.3mg  IM          1700 -- Benadryl 50mg  IV                     1712 -- Epi 0.3mg  IM                     1715 -- Zofran 4mg  IV                     1722 -- Albuterol 5mg  hhn   On arrival to ED pt is alert/oriented x 4, cooperative. Pt has diffuse redness over arms and trunk.

## 2015-07-19 LAB — BASIC METABOLIC PANEL
ANION GAP: 11 (ref 5–15)
BUN: 6 mg/dL (ref 6–20)
CO2: 17 mmol/L — ABNORMAL LOW (ref 22–32)
Calcium: 7.5 mg/dL — ABNORMAL LOW (ref 8.9–10.3)
Chloride: 115 mmol/L — ABNORMAL HIGH (ref 101–111)
Creatinine, Ser: 0.76 mg/dL (ref 0.44–1.00)
GFR calc Af Amer: >60 mL/min (ref >60)
Glucose, Bld: 120 mg/dL — ABNORMAL HIGH (ref 65–99)
POTASSIUM: 4.7 mmol/L (ref 3.5–5.1)
SODIUM: 143 mmol/L (ref 135–145)

## 2015-07-19 LAB — CBC
HCT: 33.5 % — ABNORMAL LOW (ref 36.0–46.0)
Hemoglobin: 11.9 g/dL — ABNORMAL LOW (ref 12.0–15.0)
MCH: 31.9 pg (ref 26.0–34.0)
MCHC: 35.5 g/dL (ref 30.0–36.0)
MCV: 89.8 fL (ref 78.0–100.0)
Platelets: 353 K/uL (ref 150–400)
RBC: 3.73 MIL/uL — ABNORMAL LOW (ref 3.87–5.11)
RDW: 13.5 % (ref 11.5–15.5)
WBC: 17.2 K/uL — ABNORMAL HIGH (ref 4.0–10.5)

## 2015-07-19 LAB — PHOSPHORUS

## 2015-07-19 LAB — GLUCOSE, CAPILLARY: GLUCOSE-CAPILLARY: 99 mg/dL (ref 65–99)

## 2015-07-19 LAB — MAGNESIUM: Magnesium: 1.5 mg/dL — ABNORMAL LOW (ref 1.7–2.4)

## 2015-07-19 MED ORDER — SENNA 8.6 MG PO TABS
2.0000 | ORAL_TABLET | Freq: Every day | ORAL | Status: DC
  Administered 2015-07-19: 17.2 mg via ORAL
  Filled 2015-07-19 (×2): qty 2

## 2015-07-19 MED ORDER — METHYLPREDNISOLONE SODIUM SUCC 40 MG IJ SOLR
40.0000 mg | Freq: Two times a day (BID) | INTRAMUSCULAR | Status: DC
  Administered 2015-07-19 – 2015-07-20 (×2): 40 mg via INTRAVENOUS
  Filled 2015-07-19 (×2): qty 1

## 2015-07-19 MED ORDER — EPINEPHRINE HCL 1 MG/ML IJ SOLN: 0.5000 ug/min | INTRAVENOUS | Status: DC

## 2015-07-19 MED ORDER — GUAIFENESIN 100 MG/5ML PO SOLN
5.0000 mL | ORAL | Status: DC | PRN
  Administered 2015-07-19: 100 mg via ORAL
  Filled 2015-07-19 (×2): qty 5

## 2015-07-19 MED ORDER — ZOLPIDEM TARTRATE 5 MG PO TABS: 10.0000 mg | ORAL_TABLET | Freq: Every evening | ORAL | Status: DC | PRN

## 2015-07-19 MED ORDER — SODIUM PHOSPHATE 3 MMOLE/ML IV SOLN
30.0000 mmol | Freq: Once | INTRAVENOUS | Status: AC
  Administered 2015-07-19: 30 mmol via INTRAVENOUS
  Filled 2015-07-19: qty 10

## 2015-07-19 MED ORDER — ZOLPIDEM TARTRATE 5 MG PO TABS
5.0000 mg | ORAL_TABLET | Freq: Every evening | ORAL | Status: DC | PRN
  Administered 2015-07-19 – 2015-07-20 (×2): 5 mg via ORAL
  Filled 2015-07-19 (×2): qty 1

## 2015-07-19 MED ORDER — DEXTROSE 5 % IV SOLN
0.5000 ug/min | INTRAVENOUS | Status: DC
  Filled 2015-07-19: qty 4

## 2015-07-19 MED ORDER — INSULIN ASPART 100 UNIT/ML ~~LOC~~ SOLN: 0.0000 [IU] | Freq: Three times a day (TID) | SUBCUTANEOUS | Status: DC

## 2015-07-19 MED ORDER — SODIUM CHLORIDE 0.45 % IV SOLN: INTRAVENOUS | Status: DC

## 2015-07-19 MED ORDER — MAGNESIUM SULFATE 4 GM/100ML IV SOLN
4.0000 g | Freq: Once | INTRAVENOUS | Status: AC
  Administered 2015-07-19: 4 g via INTRAVENOUS
  Filled 2015-07-19: qty 100

## 2015-07-19 MED ORDER — DIPHENHYDRAMINE HCL 50 MG/ML IJ SOLN
25.0000 mg | Freq: Four times a day (QID) | INTRAMUSCULAR | Status: DC
  Administered 2015-07-19 – 2015-07-20 (×4): 25 mg via INTRAVENOUS
  Filled 2015-07-19: qty 1
  Filled 2015-07-19 (×3): qty 0.5
  Filled 2015-07-19: qty 1

## 2015-07-19 NOTE — H&P (Signed)
PULMONARY / CRITICAL CARE MEDICINE   Name: Ebony Stanley MRN: [id number] DOB: Sep 08, 1955    ADMISSION DATE:  07/18/2015 CONSULTATION DATE:  07/18/2015  REFERRING MD:  EDP  CHIEF COMPLAINT:  Anaphylaxis  HISTORY OF PRESENT ILLNESS:  60 year old female with PMH as below, which includes Asthma, arthritis, and depression/anxiety. She had L wrist surgery about a year ago and presented to ortho office today (3/28) for follow-up. During visit she was given join injection with lidocaine and kenalog. Developed anaphylaxis.  SUBJECTIVE:  No distress, breathing easy  VITAL SIGNS: BP 101/47 mmHg  Pulse 78  Temp(Src) 98 F (36.7 C) (Oral)  Resp 19  Ht 5' (1.524 m)  Wt 86.7 kg (191 lb 2.2 oz)  BMI 37.33 kg/m2  SpO2 95%  LMP 04/22/2006  HEMODYNAMICS:    VENTILATOR SETTINGS:    INTAKE / OUTPUT: I/O last 3 completed shifts: In: 2282.2 [I.V.:2162.2; Other:70; IV Piggyback:50] Out: 2000 [Urine:2000]  PHYSICAL EXAMINATION: General:  Obese female in NAD Neuro:  Alert, oriented, non-focal HEENT: no tongue swelling, no stridor Cardiovascular:  RRR, no MRG Lungs:  Clear, no wheezing Abdomen:  Soft, non-distended Musculoskeletal:  No acute deformity or ROM limitation Skin:  Grossly intact, hives resolved  LABS:  BMET  Recent Labs Lab 07/18/15 1956 07/19/15 0805  NA 140 143  K 2.7* 4.7  CL 109 115*  CO2 16* 17*  BUN 8 6  CREATININE 0.99 0.76  GLUCOSE 223* 120*    Electrolytes  Recent Labs Lab 07/18/15 1956 07/19/15 0805  CALCIUM 7.4* 7.5*  MG  --  1.5*  PHOS  --  <1.0*    CBC  Recent Labs Lab 07/18/15 1956 07/19/15 0805  WBC 24.8* 17.2*  HGB 13.7 11.9*  HCT 39.4 33.5*  PLT 506* 353    Coag's No results for input(s): APTT, INR in the last 168 hours.  Sepsis Markers No results for input(s): LATICACIDVEN, PROCALCITON, O2SATVEN in the last 168 hours.  ABG No results for input(s): PHART, PCO2ART, PO2ART in the last 168 hours.  Liver Enzymes No  results for input(s): AST, ALT, ALKPHOS, BILITOT, ALBUMIN in the last 168 hours.  Cardiac Enzymes No results for input(s): TROPONINI, PROBNP in the last 168 hours.  Glucose No results for input(s): GLUCAP in the last 168 hours.  Imaging No results found.   STUDIES:    CULTURES:   ANTIBIOTICS:   SIGNIFICANT EVENTS:   LINES/TUBES:   DISCUSSION: 60 year old female presented to ED with anaphylactic shock after receiving kenalog/lidocaine injection at ortho office. Required 2 doses IM epi, benadryl, solumedrol, Pepcid. Much improved by time of initial PCCM evaluation, however, needing epi gtt. Will admit to ICU for close monitoring.  ASSESSMENT / PLAN:  CARDIOVASCULAR A:  Anaphylactic shock -resolved  P:  Telemetry monitoring MAP goal > 60 Scheduled pepcid-dc, benadryl - x 24 hr more then prn (reduce dose), solumedrol to q12h  PULMONARY A: Risk for airway compromise in setting of anaphylaxis  H/o Asthma  P:   Supplemental O2 as needed to keep SpO2 > 92% See cvs Ambulate and check pulse ox  RENAL A:   Hypomag hypophos NONAG from volume likely P:   BMP in am  Phos replacement Mag supp 1/2 NS Without saline, should resolve NONAG  GASTROINTESTINAL A:   No acute issues  P:   diet  HEMATOLOGIC A:   No acute issues  P:  Follow CBC SQ heparin for VTE ppx - dc as she is walking  ENDOCRINE  A:   Steroid indcued hyperglycemia   P:   ssi add  NEUROLOGIC A:   Anxiety Depression  P:   Continue home wellbutrin, celebrex  To floor, likely home in am   FAMILY  - Updates: Family updated at bedside by me  - Inter-disciplinary family meet or Palliative Care meeting due by:  4/3  Lavon Paganini. Titus Mould, MD, [place] Pgr: [place] [facility]

## 2015-07-19 NOTE — Care Management Note (Signed)
Case Management Note  Patient Details  Name: Ebony Stanley MRN: [id number] Date of Birth: 1955-09-06  Subjective/Objective:     Pt admitted with anaphylactic reaction to injection given at orthopedic office               Action/Plan:  Pt is independent from home alone.  CM will continue to follow for discharge needs   Expected Discharge Date:                  Expected Discharge Plan:  Home/Self Care  In-House Referral:     Discharge planning Services  CM Consult  Post Acute Care Choice:    Choice offered to:     DME Arranged:    DME Agency:     HH Arranged:    HH Agency:     Status of Service:  In process, will continue to follow  Medicare Important Message Given:    Date Medicare IM Given:    Medicare IM give by:    Date Additional Medicare IM Given:    Additional Medicare Important Message give by:     If discussed at [place] of Stay Meetings, dates discussed:    Additional Comments:  Maryclare Labrador, RN 07/19/2015, 2:28 PM

## 2015-07-19 NOTE — Progress Notes (Signed)
Patient seen at bedside with husband  Feeling much better with minimal complaints this AM  Deeply grateful for ICU service for excellent care  Will follow with you

## 2015-07-19 NOTE — Progress Notes (Signed)
[place] Progress Note Patient Name: Ebony Stanley DOB: [birth year] MRN: [id number]   Date of Service  07/19/2015  HPI/Events of Note  Pt requesting home ambien for sleep and something for cough  eICU Interventions  Order Lorrin Mais and robitussin     Intervention Category Minor Interventions: Other:  Felder Lebeda 07/19/2015, 9:54 PM

## 2015-07-19 NOTE — Progress Notes (Signed)
Epinephrine infusion titrated for SBP >80 per Dr. Ashby Dawes.

## 2015-07-20 ENCOUNTER — Inpatient Hospital Stay (HOSPITAL_COMMUNITY): Payer: Worker's Compensation

## 2015-07-20 DIAGNOSIS — T782XXA Anaphylactic shock, unspecified, initial encounter: Secondary | ICD-10-CM | POA: Insufficient documentation

## 2015-07-20 LAB — BASIC METABOLIC PANEL
Anion gap: 12 (ref 5–15)
BUN: 11 mg/dL (ref 6–20)
CHLORIDE: 109 mmol/L (ref 101–111)
CO2: 21 mmol/L — AB (ref 22–32)
CREATININE: 0.95 mg/dL (ref 0.44–1.00)
Calcium: 8.4 mg/dL — ABNORMAL LOW (ref 8.9–10.3)
GFR calc Af Amer: >60 mL/min (ref >60)
GLUCOSE: 186 mg/dL — AB (ref 65–99)
Potassium: 3.6 mmol/L (ref 3.5–5.1)
Sodium: 142 mmol/L (ref 135–145)

## 2015-07-20 LAB — BLOOD GAS, ARTERIAL
Acid-base deficit: 2 mmol/L (ref 0.0–2.0)
Bicarbonate: 21.9 mEq/L (ref 20.0–24.0)
DRAWN BY: 29017
FIO2: 0.21
O2 Saturation: 88 %
PCO2 ART: 35.2 mmHg (ref 35.0–45.0)
PH ART: 7.412 (ref 7.350–7.450)
Patient temperature: 98.6
TCO2: 23 mmol/L (ref 0–100)
pO2, Arterial: 54.6 mmHg — ABNORMAL LOW (ref 80.0–100.0)

## 2015-07-20 LAB — RESPIRATORY VIRUS PANEL
Adenovirus: NEGATIVE
Influenza A: NEGATIVE
Influenza B: NEGATIVE
Metapneumovirus: NEGATIVE
Parainfluenza 1: NEGATIVE
Parainfluenza 2: NEGATIVE
Parainfluenza 3: NEGATIVE
Respiratory Syncytial Virus A: NEGATIVE
Respiratory Syncytial Virus B: NEGATIVE
Rhinovirus: NEGATIVE

## 2015-07-20 LAB — PHOSPHORUS: PHOSPHORUS: 2.8 mg/dL (ref 2.5–4.6)

## 2015-07-20 LAB — GLUCOSE, CAPILLARY
GLUCOSE-CAPILLARY: 114 mg/dL — AB (ref 65–99)
GLUCOSE-CAPILLARY: 97 mg/dL (ref 65–99)
Glucose-Capillary: 121 mg/dL — ABNORMAL HIGH (ref 65–99)

## 2015-07-20 LAB — MAGNESIUM: Magnesium: 1.6 mg/dL — ABNORMAL LOW (ref 1.7–2.4)

## 2015-07-20 MED ORDER — DM-GUAIFENESIN ER 30-600 MG PO TB12
1.0000 | ORAL_TABLET | Freq: Two times a day (BID) | ORAL | Status: DC
  Administered 2015-07-20 – 2015-07-21 (×3): 1 via ORAL
  Filled 2015-07-20 (×4): qty 1

## 2015-07-20 MED ORDER — OXYCODONE-ACETAMINOPHEN 5-325 MG PO TABS
1.0000 | ORAL_TABLET | ORAL | Status: DC | PRN
  Administered 2015-07-21: 1 via ORAL
  Filled 2015-07-20: qty 1

## 2015-07-20 MED ORDER — FUROSEMIDE 10 MG/ML IJ SOLN
10.0000 mg | Freq: Once | INTRAMUSCULAR | Status: AC
Start: 2015-07-20 — End: 2015-07-20
  Administered 2015-07-20: 10 mg via INTRAVENOUS

## 2015-07-20 MED ORDER — MAGNESIUM OXIDE 400 (241.3 MG) MG PO TABS
400.0000 mg | ORAL_TABLET | Freq: Once | ORAL | Status: AC
  Administered 2015-07-20: 400 mg via ORAL
  Filled 2015-07-20: qty 1

## 2015-07-20 MED ORDER — TRAMADOL HCL 50 MG PO TABS: 50.0000 mg | ORAL_TABLET | Freq: Four times a day (QID) | ORAL | Status: DC | PRN

## 2015-07-20 NOTE — Progress Notes (Signed)
This RT attempted ABG twice and during the second attempt, patient pulled arm away and refused. RT Charge informed and will attempt to collect again.

## 2015-07-20 NOTE — Clinical Documentation Improvement (Signed)
Critical Care Pulmonology  Abnormal Lab/Test Results:    Component     Latest Ref Rng 07/18/2015 07/19/2015  Potassium     3.5 - 5.1 mmol/L 2.7 (LL) 4.7    Possible Clinical Conditions associated with below indicators   Hypokalemia  Other Condition  Cannot Clinically Determine   Supporting Information: 60 year old female admitted for anaphylactic reaction  3/28 eLink progress note: Date of Service  07/18/2015  HPI/Events of Note  Patient refused IV K due to pain and fear about reactions.   eICU Interventions  Will change to 80 meg of po potassium in divided doses, repeat K in am.          Treatment Provided: See above   Please exercise your independent, professional judgment when responding. A specific answer is not anticipated or expected.   Thank You,  [place] [phone]

## 2015-07-20 NOTE — Progress Notes (Addendum)
PULMONARY / CRITICAL CARE MEDICINE   Name: Ebony Stanley MRN: [id number] DOB: Sep 06, 1955    ADMISSION DATE:  07/18/2015 CONSULTATION DATE:  07/18/2015  REFERRING MD:  EDP  CHIEF COMPLAINT:  Anaphylaxis  HISTORY OF PRESENT ILLNESS:  60 year old female with PMH as below, which includes Asthma, arthritis, and depression/anxiety. She had L wrist surgery about a year ago and presented to ortho office today (3/28) for follow-up. During visit she was given join injection with lidocaine and kenalog. Developed anaphylaxis.  SUBJECTIVE:  No distress, breathing easy  VITAL SIGNS: BP 101/43 mmHg  Pulse 87  Temp(Src) 98.7 F (37.1 C) (Oral)  Resp 26  Ht 5' (1.524 m)  Wt 192 lb 3.9 oz (87.2 kg)  BMI 37.54 kg/m2  SpO2 86%  LMP 04/22/2006  HEMODYNAMICS:    VENTILATOR SETTINGS:    INTAKE / OUTPUT: I/O last 3 completed shifts: In: 4686 [P.O.:1920; I.V.:2176; Other:130; IV Piggyback:460] Out: 2000 [Urine:2000]  PHYSICAL EXAMINATION: General:  Obese female in NAD, weak, incessant coughing  Neuro:  Alert, oriented, non-focal HEENT: no tongue swelling, no stridor Cardiovascular:  RRR, no MRG Lungs:   mild rhonchi, exp wheeze with cough Abdomen:  Soft, non-distended Musculoskeletal:  No acute deformity or ROM limitation Skin:  Grossly intact, hives resolved  LABS:  BMET  Recent Labs Lab 07/18/15 1956 07/19/15 0805  NA 140 143  K 2.7* 4.7  CL 109 115*  CO2 16* 17*  BUN 8 6  CREATININE 0.99 0.76  GLUCOSE 223* 120*    Electrolytes  Recent Labs Lab 07/18/15 1956 07/19/15 0805 07/20/15 0240  CALCIUM 7.4* 7.5*  --   MG  --  1.5* 1.6*  PHOS  --  <1.0* 2.8    CBC  Recent Labs Lab 07/18/15 1956 07/19/15 0805  WBC 24.8* 17.2*  HGB 13.7 11.9*  HCT 39.4 33.5*  PLT 506* 353    Coag's No results for input(s): APTT, INR in the last 168 hours.  Sepsis Markers No results for input(s): LATICACIDVEN, PROCALCITON, O2SATVEN in the last 168 hours.  ABG  Recent  Labs Lab 07/20/15 0555  PHART 7.412  PCO2ART 35.2  PO2ART 54.6*    Liver Enzymes No results for input(s): AST, ALT, ALKPHOS, BILITOT, ALBUMIN in the last 168 hours.  Cardiac Enzymes No results for input(s): TROPONINI, PROBNP in the last 168 hours.  Glucose  Recent Labs Lab 07/19/15 1722 07/20/15 0848  GLUCAP 99 114*    Imaging No results found.   STUDIES:    CULTURES:   ANTIBIOTICS:   SIGNIFICANT EVENTS:   LINES/TUBES:   DISCUSSION: 60 year old female presented to ED with anaphylactic shock after receiving kenalog/lidocaine injection at ortho office. Required 2 doses IM epi, benadryl, solumedrol, Pepcid. Much improved by time of initial PCCM evaluation, however, needing epi gtt. Will admit to ICU for close monitoring. 3/30 better but not at [facility], de intensify care, move to floor, possible dc am 3/31.   ASSESSMENT / PLAN:  CARDIOVASCULAR A:  Anaphylactic shock -resolved  P:  Regular floor Dc steroids h 2 blockers   PULMONARY A: Risk for airway compromise in setting of anaphylaxis , Resolved H/o Asthma  P:   Supplemental O2 as needed to keep SpO2 > 92% See cvs Ambulate and check pulse ox 88% on ra BUT VERY SOB. Cough suppressants with DM, Tramadol, Oxycodone  RENAL A:   Hypomag P:   Replete lytes as needed  GASTROINTESTINAL A:   No acute issues  P:   Diet  as tolerated  HEMATOLOGIC A:   No acute issues  P:  Follow CBC SQ heparin for VTE ppx - dc as she is walking  ENDOCRINE A:   Steroid indcued hyperglycemia   P:   Dc steroids 3/30  NEUROLOGIC A:   Anxiety Depression  P:   Continue home wellbutrin, celebrex  To floor, likely home in am   FAMILY  - Updates: Family updated at bedside at length.   - Inter-disciplinary family meet or Palliative Care meeting due by:  4/3     Additional commments Titus Mould ' She is not wheezing now, but reports coughing and SOB, get pcxr, lasix x 1 low dose for likely edema  ssociated

## 2015-07-21 DIAGNOSIS — J9601 Acute respiratory failure with hypoxia: Secondary | ICD-10-CM

## 2015-07-21 LAB — BASIC METABOLIC PANEL
Anion gap: 9 (ref 5–15)
BUN: 10 mg/dL (ref 6–20)
CHLORIDE: 107 mmol/L (ref 101–111)
CO2: 25 mmol/L (ref 22–32)
CREATININE: 0.76 mg/dL (ref 0.44–1.00)
Calcium: 8.3 mg/dL — ABNORMAL LOW (ref 8.9–10.3)
GFR calc Af Amer: >60 mL/min (ref >60)
GFR calc non Af Amer: >60 mL/min (ref >60)
Glucose, Bld: 95 mg/dL (ref 65–99)
Potassium: 3.3 mmol/L — ABNORMAL LOW (ref 3.5–5.1)
Sodium: 141 mmol/L (ref 135–145)

## 2015-07-21 LAB — GLUCOSE, CAPILLARY
GLUCOSE-CAPILLARY: 91 mg/dL (ref 65–99)
Glucose-Capillary: 88 mg/dL (ref 65–99)

## 2015-07-21 MED ORDER — POTASSIUM CHLORIDE CRYS ER 15 MEQ PO TBCR: 30.0000 meq | EXTENDED_RELEASE_TABLET | ORAL | Status: DC

## 2015-07-21 MED ORDER — DM-GUAIFENESIN ER 30-600 MG PO TB12: 1.0000 | ORAL_TABLET | Freq: Two times a day (BID) | ORAL | Status: DC

## 2015-07-21 MED ORDER — POTASSIUM CHLORIDE CRYS ER 20 MEQ PO TBCR
30.0000 meq | EXTENDED_RELEASE_TABLET | ORAL | Status: AC
  Administered 2015-07-21 (×2): 30 meq via ORAL
  Filled 2015-07-21 (×2): qty 1

## 2015-07-21 MED ORDER — POTASSIUM CHLORIDE CRYS ER 20 MEQ PO TBCR: 20.0000 meq | EXTENDED_RELEASE_TABLET | ORAL | Status: DC

## 2015-07-21 NOTE — Discharge Summary (Signed)
Physician Discharge Summary  Patient ID: Ebony Stanley MRN: [id number] DOB/AGE: April 17, 1956 60 y.o.  Admit date: 07/18/2015 Discharge date: 07/21/2015  Problem List Active Problems:   Anaphylactic shock   Anaphylactic reaction  HPI: 60 year old female with PMH as below, which includes Asthma, arthritis, and depression/anxiety. She had L rist surgery about a year ago and presented to ortho office today (3/28) for follow-up. During visit she was given join injection with lidocaine and kenalog. She left the office and began to drive home, however on her drive she felt flush, itching, tongue swelling, and dizziness. She turned around and drove back to office. EMS was called. She was given 2 doses of epi 0.3 mg IM, one dose of eavh benadryl, zofran, and albuterol. Initially in ED she was very confused and combative, and then lethargic. He condition improved with the aforementioned interventions. PCCM asked to see for admission.   Hospital Course:  INTAKE / OUTPUT: I/O last 3 completed shifts: In: 1920 [P.O.:1920] Out: -   PHYSICAL EXAMINATION: General: Obese female.Awake. Alert. In the bathroom on my entry to her room.  Neuro: A&O x4. Grossly nonfocal. CN in tact. HEENT: MMM. No angioedema. No scleral icterus. Cardiovascular: Regular rate. No edema. Regular rhythm. Lungs: Clear to auscultation. Normal work of breathing on room air. Abdomen: Soft. Protuberant. Nontender. Integument: Warm & dry. No rash on exposed skin  ASSESSMENT / PLAN: 60 year old female presented to ED with anaphylactic shock after receiving kenalog/lidocaine injection at ortho office. Required 2 doses IM epi, benadryl, solumedrol, Pepcid. Patient's symptoms have totally resolved. Still has hypokalemia this morning after Lasix which is being replaced. Patient feels much improved. No signs of asthma exacerbation at this time or volume overload.  1. Anaphylactic Shock: Resolved. Secondary to kenalog/lidocaine  injection at outside office. 2. Acute Hypoxic Respiratory Failure: Likely some element of pulmonary edema.  3. H/O Asthma: Not on any inhaler medications. No signs of exacerbation. 4. Hypokalemia: Replacing PO KCl 96mEq x2 doses. Patient previously refused IV. 5. Hyperglycemia: Resolved. Likely secondary to steroids.  6. Insomnia: Ambien PO prn. 7. H/O Anxiety/Depression: Continuing Wellbutrin XL (home dose). 8. H/O Arthritis: Ultram & Percocet PO prn. 9. Prophylaxis: SCDs. Disposition: Ambulate in hallway with nurse on room air with O2 sats >92%. Therefore dc home  Labs at discharge Lab Results  Component Value Date   CREATININE 0.76 07/21/2015   BUN 10 07/21/2015   NA 141 07/21/2015   K 3.3* 07/21/2015   CL 107 07/21/2015   CO2 25 07/21/2015   Lab Results  Component Value Date   WBC 17.2* 07/19/2015   HGB 11.9* 07/19/2015   HCT 33.5* 07/19/2015   MCV 89.8 07/19/2015   PLT 353 07/19/2015   Lab Results  Component Value Date   ALT 30 09/16/2014   AST 25 09/16/2014   ALKPHOS 51 09/16/2014   BILITOT 0.2* 09/16/2014   No results found for: INR, PROTIME  Current radiology studies Dg Chest Port 1 View  07/20/2015  CLINICAL DATA:  Anaphylactic reaction.  Flu symptoms. EXAM: PORTABLE CHEST - 1 VIEW COMPARISON:  Two-view chest x-ray 12/05/2008 FINDINGS: The heart size is normal. Patchy airspace disease is evident in the right middle lobe and left lower lobe. There is mild pulmonary vascular congestion. No definite effusions are present. The visualized soft tissues and bony thorax are unremarkable. IMPRESSION: 1. Patchy basilar airspace disease suspicious for infection within the right middle lobe and left lower lobe. 2. Mild pulmonary vascular congestion without definite effusions.  Electronically Signed   By: San Morelle M.D.   On: 07/20/2015 13:29    Disposition:  01-Home or Self Care     Medication List    STOP taking these medications        ibuprofen  200 MG tablet  Commonly known as:  ADVIL,MOTRIN      TAKE these medications        buPROPion 150 MG 24 hr tablet  Commonly known as:  WELLBUTRIN XL  TAKE 1 TABLET (150 MG TOTAL) BY MOUTH DAILY.     CELEBREX 200 MG capsule  Generic drug:  celecoxib  Take 200 mg by mouth daily.     dextromethorphan-guaiFENesin 30-600 MG 12hr tablet  Commonly known as:  MUCINEX DM  Take 1 tablet by mouth 2 (two) times daily.     estradiol 1 MG tablet  Commonly known as:  ESTRACE  TAKE 1 TABLET (1 MG TOTAL) BY MOUTH DAILY.     multivitamin with minerals tablet  Take 1 tablet by mouth daily.     naproxen sodium 220 MG tablet  Commonly known as:  ANAPROX  Take 220 mg by mouth daily as needed.     oxyCODONE-acetaminophen 5-325 MG tablet  Commonly known as:  PERCOCET/ROXICET  Take 1-2 tablets by mouth every 6 (six) hours as needed for severe pain.     potassium chloride SA 15 MEQ tablet  Commonly known as:  KLOR-CON M15  Take 2 tablets (30 mEq total) by mouth every hour.     progesterone 100 MG capsule  Commonly known as:  PROMETRIUM  TAKE 1 CAPSULE (100 MG TOTAL) BY MOUTH DAILY.     SENNA CO  2 tablets by Combination route daily.     traMADol 50 MG tablet  Commonly known as:  ULTRAM  Take 1 tablet by mouth every 4 (four) hours as needed.     zolpidem 10 MG tablet  Commonly known as:  AMBIEN  Take 1 tablet (10 mg total) by mouth at bedtime as needed for sleep.           Follow-up Information    Follow up with Rexene Edison, NP On 08/07/2015.   Specialty:  Pulmonary Disease   Why:  Tammy Parrett ANP-BC at 4:00 pm   Contact information:   [address] [address] [phone]        Discharged Condition: fair  Time spent on discharge greater than 40 minutes.  Vital signs at Discharge. Temp:  [98.2 F (36.8 C)-98.4 F (36.9 C)] 98.2 F (36.8 C) (03/31 0423) Pulse Rate:  [82-102] 82 (03/31 0423) Resp:  [15-27] 15 (03/31 0423) BP: (96-111)/(50-67) 107/62  mmHg (03/31 0423) SpO2:  [93 %-96 %] 94 % (03/31 0423) Weight:  [191 lb 1.6 oz (86.682 kg)] 191 lb 1.6 oz (86.682 kg) (03/31 0423) Office follow up Special Information or instructions. She is to follow up with Tammy Parrett ANP-BC in office. Please confirmed she is not using 3 NSIADS.  He was instructed to pick one and stop other two. Signed: Richardson Landry Minor ACNP Maryanna Shape PCCM Pager [phone] till 3 pm If no answer page [phone] 07/21/2015, 12:31 PM

## 2015-07-21 NOTE — Discharge Instructions (Signed)
Be careful taking 3 different NSIADS as we discusses. Pick one and use as prescribed.

## 2015-07-21 NOTE — Progress Notes (Signed)
Ebony Stanley Left to be D/C'd Home per MD order. Discussed with the patient and all questions fully answered.    Medication List    STOP taking these medications        ibuprofen 200 MG tablet  Commonly known as:  ADVIL,MOTRIN      TAKE these medications        buPROPion 150 MG 24 hr tablet  Commonly known as:  WELLBUTRIN XL  TAKE 1 TABLET (150 MG TOTAL) BY MOUTH DAILY.     CELEBREX 200 MG capsule  Generic drug:  celecoxib  Take 200 mg by mouth daily.     dextromethorphan-guaiFENesin 30-600 MG 12hr tablet  Commonly known as:  MUCINEX DM  Take 1 tablet by mouth 2 (two) times daily.     estradiol 1 MG tablet  Commonly known as:  ESTRACE  TAKE 1 TABLET (1 MG TOTAL) BY MOUTH DAILY.     multivitamin with minerals tablet  Take 1 tablet by mouth daily.     naproxen sodium 220 MG tablet  Commonly known as:  ANAPROX  Take 220 mg by mouth daily as needed.     oxyCODONE-acetaminophen 5-325 MG tablet  Commonly known as:  PERCOCET/ROXICET  Take 1-2 tablets by mouth every 6 (six) hours as needed for severe pain.     potassium chloride SA 15 MEQ tablet  Commonly known as:  KLOR-CON M15  Take 2 tablets (30 mEq total) by mouth every hour.     progesterone 100 MG capsule  Commonly known as:  PROMETRIUM  TAKE 1 CAPSULE (100 MG TOTAL) BY MOUTH DAILY.     SENNA CO  2 tablets by Combination route daily.     traMADol 50 MG tablet  Commonly known as:  ULTRAM  Take 1 tablet by mouth every 4 (four) hours as needed.     zolpidem 10 MG tablet  Commonly known as:  AMBIEN  Take 1 tablet (10 mg total) by mouth at bedtime as needed for sleep.        VVS, Skin clean, dry and intact without evidence of skin break down, no evidence of skin tears noted.  IV catheter discontinued intact. Site without signs and symptoms of complications. Dressing and pressure applied.  An After Visit Summary was printed and given to the patient.  Patient escorted via [place], and D/C home via private auto.   [name]  07/21/2015 2:49 PM

## 2015-07-21 NOTE — Progress Notes (Signed)
PULMONARY / CRITICAL CARE MEDICINE   Name: Ebony Stanley MRN: [id number] DOB: 1956-01-31    ADMISSION DATE:  07/18/2015 CONSULTATION DATE:  07/18/2015  REFERRING MD:  EDP  CHIEF COMPLAINT:  Anaphylaxis  HISTORY OF PRESENT ILLNESS:  60 year old female with PMH as below, which includes Asthma, arthritis, and depression/anxiety. She had L wrist surgery about a year ago and presented to ortho office today (3/28) for follow-up. During visit she was given join injection with lidocaine and kenalog. Developed anaphylaxis.  SUBJECTIVE:  Patient developed wheezing yesterday and was given Lasix. Patient reports wheezing & chest tightness have largely resolved. No difficulty breathing ambulating in her room. Mild near syncope with ambulation.  REVIEW OF SYSTEMS:  No nausea, emesis, or abdominal pain. No fever or chills.  VITAL SIGNS: BP 107/62 mmHg  Pulse 82  Temp(Src) 98.2 F (36.8 C) (Oral)  Resp 15  Ht 5' (1.524 m)  Wt 191 lb 1.6 oz (86.682 kg)  BMI 37.32 kg/m2  SpO2 94%  LMP 04/22/2006  HEMODYNAMICS:    VENTILATOR SETTINGS:    INTAKE / OUTPUT: I/O last 3 completed shifts: In: 1920 [P.O.:1920] Out: -   PHYSICAL EXAMINATION: General:  Obese female.Awake. Alert. In the bathroom on my entry to her room.  Neuro:  A&O x4. Grossly nonfocal. CN in tact. HEENT: MMM. No angioedema. No scleral icterus. Cardiovascular:  Regular rate. No edema. Regular rhythm. Lungs:   Clear to auscultation. Normal work of breathing on room air. Abdomen:  Soft. Protuberant. Nontender. Integument:  Warm & dry. No rash on exposed skin.  LABS:  BMET  Recent Labs Lab 07/19/15 0805 07/20/15 1254 07/21/15 0643  NA 143 142 141  K 4.7 3.6 3.3*  CL 115* 109 107  CO2 17* 21* 25  BUN 6 11 10   CREATININE 0.76 0.95 0.76  GLUCOSE 120* 186* 95    Electrolytes  Recent Labs Lab 07/19/15 0805 07/20/15 0240 07/20/15 1254 07/21/15 0643  CALCIUM 7.5*  --  8.4* 8.3*  MG 1.5* 1.6*  --   --   PHOS  <1.0* 2.8  --   --     CBC  Recent Labs Lab 07/18/15 1956 07/19/15 0805  WBC 24.8* 17.2*  HGB 13.7 11.9*  HCT 39.4 33.5*  PLT 506* 353    Coag's No results for input(s): APTT, INR in the last 168 hours.  Sepsis Markers No results for input(s): LATICACIDVEN, PROCALCITON, O2SATVEN in the last 168 hours.  ABG  Recent Labs Lab 07/20/15 0555  PHART 7.412  PCO2ART 35.2  PO2ART 54.6*    Liver Enzymes No results for input(s): AST, ALT, ALKPHOS, BILITOT, ALBUMIN in the last 168 hours.  Cardiac Enzymes No results for input(s): TROPONINI, PROBNP in the last 168 hours.  Glucose  Recent Labs Lab 07/19/15 1722 07/20/15 0848 07/20/15 1137 07/20/15 2258 07/21/15 0659 07/21/15 1107  GLUCAP 99 114* 121* 97 88 91    Imaging Dg Chest Port 1 View  07/20/2015  CLINICAL DATA:  Anaphylactic reaction.  Flu symptoms. EXAM: PORTABLE CHEST - 1 VIEW COMPARISON:  Two-view chest x-ray 12/05/2008 FINDINGS: The heart size is normal. Patchy airspace disease is evident in the right middle lobe and left lower lobe. There is mild pulmonary vascular congestion. No definite effusions are present. The visualized soft tissues and bony thorax are unremarkable. IMPRESSION: 1. Patchy basilar airspace disease suspicious for infection within the right middle lobe and left lower lobe. 2. Mild pulmonary vascular congestion without definite effusions. Electronically Signed   By:  San Morelle M.D.   On: 07/20/2015 13:29    STUDIES:  Port CXR 3/30:  Personally reviewed by me. No obvious focal consolidation. Bilateral hilar fullness with hazy opacity right middle-lower lung zones & left lower lung zone. No obvious effusion. Low lung volumes.  MICROBIOLOGY: Respiratory Viral Panel PCR 3/29:  Negative   ANTIBIOTICS: None.  SIGNIFICANT EVENTS: 3/28 - Admit 3/30 - Transfer out of ICU  LINES/TUBES: PIV x1  ASSESSMENT / PLAN:  60 year old female presented to ED with anaphylactic shock after  receiving kenalog/lidocaine injection at ortho office. Required 2 doses IM epi, benadryl, solumedrol, Pepcid. Patient's symptoms have totally resolved. Still has hypokalemia this morning after Lasix which is being replaced. Patient feels much improved. No signs of asthma exacerbation at this time or volume overload.  1. Anaphylactic Shock:  Resolved. Secondary to kenalog/lidocaine injection at outside office. 2. Acute Hypoxic Respiratory Failure:  Likely some element of pulmonary edema.  3. H/O Asthma: Not on any inhaler medications. No signs of exacerbation. 4. Hypokalemia:  Replacing PO KCl 84mEq x2 doses. Patient previously refused IV. 5. Hyperglycemia:  Resolved. Likely secondary to steroids.  6. Insomnia:  Ambien PO prn. 7. H/O Anxiety/Depression:  Continuing Wellbutrin XL (home dose). 8. H/O Arthritis:  Ultram & Percocet PO prn. 9. Prophylaxis:  SCDs. 10. Disposition:  Plan to ambulate in hallway with nurse to determine if she has any oxygen requirement then likely discharge to home.   Sonia Baller Ashok Cordia, M.D. [facility] Pager:  [phone] After 3pm or if no response, call [phone] 11:42 AM 07/21/2015

## 2015-08-07 ENCOUNTER — Ambulatory Visit (INDEPENDENT_AMBULATORY_CARE_PROVIDER_SITE_OTHER): Payer: Self-pay | Admitting: Adult Health

## 2015-08-07 ENCOUNTER — Encounter: Payer: Self-pay | Admitting: Adult Health

## 2015-08-07 ENCOUNTER — Ambulatory Visit (INDEPENDENT_AMBULATORY_CARE_PROVIDER_SITE_OTHER)
Admission: RE | Admit: 2015-08-07 | Discharge: 2015-08-07 | Disposition: A | Discharge status: Home / Self Care | Payer: BC Managed Care – PPO | Source: Ambulatory Visit | Attending: Adult Health | Admitting: Adult Health

## 2015-08-07 VITALS — BP 116/76 | HR 75 | Temp 97.4°F | Ht 60.0 in | Wt 179.0 lb

## 2015-08-07 DIAGNOSIS — R0602 Shortness of breath: Secondary | ICD-10-CM | POA: Diagnosis not present

## 2015-08-07 DIAGNOSIS — T782XXD Anaphylactic shock, unspecified, subsequent encounter: Secondary | ICD-10-CM

## 2015-08-07 NOTE — Assessment & Plan Note (Addendum)
Reaction after kenalog and lidocaine  Both have been placed on allergy profile.  Appears to have recovered.  Did have some abnormalities on cxr, will repeat today  Advised to follow up with PCP and GYN for menses and abdominal pain .  Follow up with our office as needed.

## 2015-08-07 NOTE — Patient Instructions (Signed)
Follow up with Primary MD and Gynecologist as discussed  Chest xray today .  Follow up with our office as needed.

## 2015-08-07 NOTE — Progress Notes (Signed)
Subjective:    Patient ID: Ebony Stanley, female    DOB: 12/12/1955, 60 y.o.   MRN: [id number]  HPI 60 yo female seen for pulmonary consult for anaphylactic shock in hospital 06/2015.   08/07/2015 [facility] follow up  Returns for a post hospital follow-up. Patient was admitted 07/19/2015 for anaphylactic shock. Patient was at her orthopedist and received a steroid joint injection with lidocaine and Kenalog. Shortly after this she started to have flushing, itching and tongue swelling with associated dizziness. Symptoms continued to worsen. And patient was transported to the emergency room via EMS. Patient was given 2 doses of epinephrine. Solu-Medrol, Pepcid and Benadryl.  Patient slowly improved over the course of the hospitalization. There was no desaturations of oxygen with ambulation. Chest x-ray did show some  patchy air space disease.  Chest x-ray repeated today is clear.Marland Kitchen Discharge. Patient says that she is improved. She has no shortness of breath, swelling, cough or wheezing.. She does state that she has started her period which she has not had for greater than 8 years. We discussed her following up with her gynecologist. She is also been having some intermittent abdominal pain, especially around the umbilicus. Denies any nausea, vomiting, diarrhea or bloody stools. No urinary symptoms..  Past Medical History  Diagnosis Date  . Depression   . Arthritis   . Heart murmur     as child  . Asthma     when pregnant  . Anxiety   . Constipation    Current Outpatient Prescriptions on File Prior to Visit  Medication Sig Dispense Refill  . CELEBREX 200 MG capsule Take 200 mg by mouth daily.     Marland Kitchen dextromethorphan-guaiFENesin (MUCINEX DM) 30-600 MG 12hr tablet Take 1 tablet by mouth 2 (two) times daily. 60 tablet 2  . estradiol (ESTRACE) 1 MG tablet TAKE 1 TABLET (1 MG TOTAL) BY MOUTH DAILY. 30 tablet 0  . Multiple Vitamins-Minerals (MULTIVITAMIN WITH MINERALS) tablet Take 1 tablet by  mouth daily.    . naproxen sodium (ANAPROX) 220 MG tablet Take 220 mg by mouth daily as needed.    Marland Kitchen oxyCODONE-acetaminophen (PERCOCET/ROXICET) 5-325 MG per tablet Take 1-2 tablets by mouth every 6 (six) hours as needed for severe pain. 15 tablet 0  . potassium chloride (KLOR-CON M15) 15 MEQ tablet Take 2 tablets (30 mEq total) by mouth every hour.    . progesterone (PROMETRIUM) 100 MG capsule TAKE 1 CAPSULE (100 MG TOTAL) BY MOUTH DAILY. 30 capsule 0  . SENNA CO 2 tablets by Combination route daily.    . traMADol (ULTRAM) 50 MG tablet Take 1 tablet by mouth every 4 (four) hours as needed.  0  . buPROPion (WELLBUTRIN XL) 150 MG 24 hr tablet TAKE 1 TABLET (150 MG TOTAL) BY MOUTH DAILY. 30 tablet 0   No current facility-administered medications on file prior to visit.    Review of Systems Constitutional:   No  weight loss, night sweats,  Fevers, chills, fatigue, or  lassitude.  HEENT:   No headaches,  Difficulty swallowing,  Tooth/dental problems, or  Sore throat,                No sneezing, itching, ear ache, nasal congestion, post nasal drip,   CV:  No chest pain,  Orthopnea, PND, swelling in lower extremities, anasarca, dizziness, palpitations, syncope.   GI  No heartburn, indigestion, abdominal pain, nausea, vomiting, diarrhea, change in bowel habits, loss of appetite, bloody stools.   Resp: No shortness of  breath with exertion or at rest.  No excess mucus, no productive cough,  No non-productive cough,  No coughing up of blood.  No change in color of mucus.  No wheezing.  No chest wall deformity  Skin: no rash or lesions.  GU: no dysuria, change in color of urine, no urgency or frequency.  No flank pain, no hematuria   MS:  No joint pain or swelling.  No decreased range of motion.  No back pain.  Psych:  No change in mood or affect. No depression or anxiety.  No memory loss.         Objective:   Physical Exam Filed Vitals:   08/07/15 1645  BP: 116/76  Pulse: 75  Temp:  97.4 F (36.3 C)  TempSrc: Oral  Height: 5' (1.524 m)  Weight: 179 lb (81.194 kg)  SpO2: 96%   GEN: A/Ox3; pleasant , NAD, well nourished   HEENT:  [place]/AT,  EACs-clear, TMs-wnl, NOSE-clear, THROAT-clear, no lesions, no postnasal drip or exudate noted.   NECK:  Supple w/ fair ROM; no JVD; normal carotid impulses w/o bruits; no thyromegaly or nodules palpated; no lymphadenopathy.  RESP  Clear  P & A; w/o, wheezes/ rales/ or rhonchi.no accessory muscle use, no dullness to percussion  CARD:  RRR, no m/r/g  , no peripheral edema, pulses intact, no cyanosis or clubbing.  GI:   Soft & nt; nml bowel sounds; no organomegaly or masses detected. No guarding or rebound noted.   Musco: Warm bil, no deformities or joint swelling noted.   Neuro: alert, no focal deficits noted.    Skin: Warm, no lesions or rashes  Darvin Dials NP-C  [facility]  08/07/2015        Assessment & Plan:

## 2015-08-09 NOTE — Progress Notes (Signed)
Note reviewed.  Sonia Baller Ashok Cordia, M.D. [facility] Pager:  [phone] After 3pm or if no response, call [phone] 12:36 AM 08/09/2015

## 2015-08-10 NOTE — Progress Notes (Signed)
Quick Note:  LMTCB ______ 

## 2015-08-10 NOTE — Progress Notes (Signed)
Quick Note:  Called spoke with pt. Reviewed results and recs. Pt voiced understanding and had no further questions. ______ 

## 2015-11-20 ENCOUNTER — Ambulatory Visit (HOSPITAL_COMMUNITY)
Admission: AD | Admit: 2015-11-20 | Discharge: 2015-11-20 | Disposition: A | Discharge status: Home / Self Care | Payer: BC Managed Care – PPO | Source: Ambulatory Visit | Attending: Obstetrics | Admitting: Obstetrics

## 2015-11-20 ENCOUNTER — Inpatient Hospital Stay (HOSPITAL_COMMUNITY): Payer: BC Managed Care – PPO | Admitting: Anesthesiology

## 2015-11-20 ENCOUNTER — Encounter (HOSPITAL_COMMUNITY): Payer: Self-pay

## 2015-11-20 ENCOUNTER — Encounter (HOSPITAL_COMMUNITY)
Admission: AD | Disposition: A | Discharge status: Home / Self Care | Payer: Self-pay | Source: Ambulatory Visit | Attending: Obstetrics

## 2015-11-20 DIAGNOSIS — N764 Abscess of vulva: Secondary | ICD-10-CM

## 2015-11-20 HISTORY — PX: INCISION AND DRAINAGE ABSCESS: SHX5864

## 2015-11-20 HISTORY — DX: Other complications of anesthesia, initial encounter: T88.59XA

## 2015-11-20 HISTORY — DX: Adverse effect of unspecified anesthetic, initial encounter: T41.45XA

## 2015-11-20 LAB — CBC WITH DIFFERENTIAL/PLATELET
BASOS ABS: 0 10*3/uL (ref 0.0–0.1)
Basophils Relative: 0 %
EOS ABS: 0.1 10*3/uL (ref 0.0–0.7)
EOS PCT: 0 %
HCT: 38.9 % (ref 36.0–46.0)
Hemoglobin: 14 g/dL (ref 12.0–15.0)
LYMPHS PCT: 14 %
Lymphs Abs: 2.4 10*3/uL (ref 0.7–4.0)
MCH: 31.6 pg (ref 26.0–34.0)
MCHC: 36 g/dL (ref 30.0–36.0)
MCV: 87.8 fL (ref 78.0–100.0)
MONO ABS: 1.3 10*3/uL — AB (ref 0.1–1.0)
Monocytes Relative: 7 %
Neutro Abs: 14 10*3/uL — ABNORMAL HIGH (ref 1.7–7.7)
Neutrophils Relative %: 79 %
PLATELETS: 422 10*3/uL — AB (ref 150–400)
RBC: 4.43 MIL/uL (ref 3.87–5.11)
RDW: 13.7 % (ref 11.5–15.5)
WBC: 17.8 10*3/uL — AB (ref 4.0–10.5)

## 2015-11-20 SURGERY — INCISION AND DRAINAGE, ABSCESS
Anesthesia: General | Site: Vulva

## 2015-11-20 MED ORDER — BUPIVACAINE HCL (PF) 0.25 % IJ SOLN
INTRAMUSCULAR | Status: DC | PRN
  Administered 2015-11-20: 10 mL

## 2015-11-20 MED ORDER — METOCLOPRAMIDE HCL 5 MG/ML IJ SOLN
INTRAMUSCULAR | Status: DC | PRN
  Administered 2015-11-20: 10 mg via INTRAVENOUS

## 2015-11-20 MED ORDER — SULFAMETHOXAZOLE-TRIMETHOPRIM 800-160 MG PO TABS
1.0000 | ORAL_TABLET | Freq: Two times a day (BID) | ORAL | Status: DC
  Administered 2015-11-20: 1 via ORAL
  Filled 2015-11-20 (×3): qty 1

## 2015-11-20 MED ORDER — PROPOFOL 10 MG/ML IV BOLUS
INTRAVENOUS | Status: AC
  Filled 2015-11-20: qty 20

## 2015-11-20 MED ORDER — LACTATED RINGERS IV SOLN
INTRAVENOUS | Status: DC
  Administered 2015-11-20 (×2): via INTRAVENOUS

## 2015-11-20 MED ORDER — DEXAMETHASONE SODIUM PHOSPHATE 4 MG/ML IJ SOLN
INTRAMUSCULAR | Status: AC
  Filled 2015-11-20: qty 1

## 2015-11-20 MED ORDER — PROPOFOL 10 MG/ML IV BOLUS
INTRAVENOUS | Status: DC | PRN
  Administered 2015-11-20: 250 mg via INTRAVENOUS
  Administered 2015-11-20: 100 mg via INTRAVENOUS

## 2015-11-20 MED ORDER — PHENYLEPHRINE HCL 10 MG/ML IJ SOLN
INTRAMUSCULAR | Status: DC | PRN
  Administered 2015-11-20 (×2): 80 ug via INTRAVENOUS

## 2015-11-20 MED ORDER — PHENYLEPHRINE 40 MCG/ML (10ML) SYRINGE FOR IV PUSH (FOR BLOOD PRESSURE SUPPORT)
PREFILLED_SYRINGE | INTRAVENOUS | Status: AC
  Filled 2015-11-20: qty 10

## 2015-11-20 MED ORDER — PROMETHAZINE HCL 25 MG/ML IJ SOLN: 6.2500 mg | INTRAMUSCULAR | Status: DC | PRN

## 2015-11-20 MED ORDER — FENTANYL CITRATE (PF) 100 MCG/2ML IJ SOLN
INTRAMUSCULAR | Status: AC
  Filled 2015-11-20: qty 2

## 2015-11-20 MED ORDER — ONDANSETRON HCL 4 MG/2ML IJ SOLN
INTRAMUSCULAR | Status: AC
  Filled 2015-11-20: qty 2

## 2015-11-20 MED ORDER — MIDAZOLAM HCL 2 MG/2ML IJ SOLN
INTRAMUSCULAR | Status: DC | PRN
  Administered 2015-11-20: 2 mg via INTRAVENOUS

## 2015-11-20 MED ORDER — MIDAZOLAM HCL 2 MG/2ML IJ SOLN
INTRAMUSCULAR | Status: AC
  Filled 2015-11-20: qty 2

## 2015-11-20 MED ORDER — ONDANSETRON HCL 4 MG/2ML IJ SOLN
INTRAMUSCULAR | Status: DC | PRN
  Administered 2015-11-20: 4 mg via INTRAVENOUS

## 2015-11-20 MED ORDER — OXYCODONE-ACETAMINOPHEN 5-325 MG PO TABS: 1.0000 | ORAL_TABLET | Freq: Four times a day (QID) | ORAL | 0 refills | Status: DC | PRN

## 2015-11-20 MED ORDER — PROPOFOL 10 MG/ML IV BOLUS
INTRAVENOUS | Status: AC
  Filled 2015-11-20: qty 40

## 2015-11-20 MED ORDER — FENTANYL CITRATE (PF) 100 MCG/2ML IJ SOLN: 25.0000 ug | INTRAMUSCULAR | Status: DC | PRN

## 2015-11-20 MED ORDER — IBUPROFEN 600 MG PO TABS: 600.0000 mg | ORAL_TABLET | Freq: Four times a day (QID) | ORAL | 0 refills | Status: DC

## 2015-11-20 MED ORDER — FENTANYL CITRATE (PF) 100 MCG/2ML IJ SOLN
INTRAMUSCULAR | Status: DC | PRN
  Administered 2015-11-20: 100 ug via INTRAVENOUS

## 2015-11-20 MED ORDER — SULFAMETHOXAZOLE-TRIMETHOPRIM 800-160 MG PO TABS: 1.0000 | ORAL_TABLET | Freq: Two times a day (BID) | ORAL | 0 refills | Status: DC

## 2015-11-20 MED ORDER — METOCLOPRAMIDE HCL 5 MG/ML IJ SOLN
INTRAMUSCULAR | Status: AC
  Filled 2015-11-20: qty 2

## 2015-11-20 MED ORDER — SCOPOLAMINE 1 MG/3DAYS TD PT72SCOPOLAMINE 1 MG/3DAYS
MEDICATED_PATCH | TRANSDERMAL | Status: DC | PRN
Start: 2015-11-20 — End: 2015-11-20
  Administered 2015-11-20: 1 via TRANSDERMAL

## 2015-11-20 SURGICAL SUPPLY — 18 items
BLADE SURG 15 STRL LF C SS BP (BLADE) ×1 IMPLANT
BLADE SURG 15 STRL SS (BLADE) ×2
BLADE SURG CLIPPER 3M 9600 (MISCELLANEOUS) ×3 IMPLANT
GAUZE PACKING 1/4 X5 YD (GAUZE/BANDAGES/DRESSINGS) ×3 IMPLANT
GOWN SURG XXL (GOWNS) ×3 IMPLANT
GOWN SURGICAL LARGE (GOWNS) ×3 IMPLANT
NEEDLE HYPO 22GX1.5 SAFETY (NEEDLE) ×3 IMPLANT
NEEDLE HYPO 25X1 1.5 SAFETY (NEEDLE) ×3 IMPLANT
PACK VAGINAL MINOR WOMEN LF (CUSTOM PROCEDURE TRAY) ×3 IMPLANT
PAD OB MATERNITY 4.3X12.25 (PERSONAL CARE ITEMS) ×3 IMPLANT
SLEEVE SCD COMPRESS KNEE MED (MISCELLANEOUS) ×3 IMPLANT
SWAB CULTURE LIQ STUART DBL (MISCELLANEOUS) ×3 IMPLANT
SYR CONTROL 10ML LL (SYRINGE) ×6 IMPLANT
TOWEL OR 17X24 6PK STRL BLUE (TOWEL DISPOSABLE) ×3 IMPLANT
TUBE ANAEROBIC SPECIMEN COL (MISCELLANEOUS) ×3 IMPLANT
TUBING NON-CON 1/4 X 20 CONN (TUBING) ×2 IMPLANT
TUBING NON-CON 1/4 X 20' CONN (TUBING) ×1
YANKAUER SUCT BULB TIP NO VENT (SUCTIONS) ×3 IMPLANT

## 2015-11-20 NOTE — H&P (Signed)
60 y.o. presents for incision and drainage of multiple vulvar abscesses.  She was seen by Dr. Philis Pique in the office on 7/27.  She was noted to have a small lesion on the vulva and under the axilla.  Both were small and I&D not indicated.  She reports increased pain over the weekend as well as the development of two additional vulvar abscesses and an area of redness and pain on the left lower abdomen.  One on the left labia drained spontaneously.  She was febrile to 102F today and reports difficulty walking due to the pain.  She was unable to tolerate attempt at I&D in the office by Dr. Stann Mainland and was sent to MAU.  Here, she was poorly tolerant of exam and requested drainage in the operating room.    Of note, pt admitted in March 2017 w anaphylaxis after kenalog/lidocaine injection.  Has received lidocain multiple times in past (most recently 03/2015 at [facility] ob/gyn for outpt hysteroscopy) without allergic reaction.   Past Medical History:  Diagnosis Date  . Anxiety   . Arthritis   . Asthma    when pregnant  . Complication of anesthesia    unknown if Lidocaine was the cause of the anaphylaxis  . Constipation   . Depression   . Heart murmur    as child    Past Surgical History:  Procedure Laterality Date  . [place]  . KNEE ARTHROSCOPY Right    x 2  . TONSILLECTOMY    . WRIST SURGERY      OB History  Gravida Para Term Preterm AB Living  7 2 2   4 2   SAB TAB Ectopic Multiple Live Births  1            # Outcome Date GA Lbr Len/2nd Weight Sex Delivery Anes PTL Lv  7 SAB           6 AB           5 AB           4 AB           3 Term           2 Term           1 Gravida               Social History   Social History  . Marital status: Married    Spouse name: N/A  . Number of children: 2  . 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 Smoking status: Never Smoker  . Smokeless tobacco:  Never Used  . Alcohol use Yes     Comment: very rare  . Drug use: No  . Sexual activity: Yes    Birth control/ protection: Post-menopausal     Comment: G8- has 2 childen, 1sab, 4tab   Other Topics Concern  . Not on file   Social History Narrative  . No narrative on file   Kenalog [triamcinolone] and Lidocaine      Vitals:   11/20/15 1620  BP: 123/82  Pulse: 115  Resp: 16  Temp: 99.1 F (37.3 C)     General:  NAD Abdomen:  Area of erythema of left lower abdomen with a 3 cm area of induration and ? Fluctuance at center Vulva: Left vulva w 4 cm abscess of left labia majora, surrounding erythema and induration--no  active drainage.  Right vulva w a small 1 cm abscess and a larger 2-3 cm abscess at inferior aspect of labia majora w significant surrounding induration--some spontaneous drainage      A/P   60 y.o. with multiple abdominal and vulvar abscesses Vulvar abscesses--require I&D--pt unable to tolerate even exam in MAU.  Will take to OR for anesthesia Discussed risks/benefits/alternatives and pt elects to proceed.  Plan to I&D vulvar abscesses and will further evaluate abdominal abscess for potential I&D as well under anesthesia Given multiple abscesses in varying locations, will check cbc w diff, HIV, A1c.  Will send wound cultures and d/c w course of PO bactrim to cover MRSA.    [place]

## 2015-11-20 NOTE — MAU Note (Addendum)
Has several abscesses (each armpit and labia). Tried to drain in the office but couldn't tolerate the lidocaine. Sent here to see Dr. Carlis Abbott Had fever of 102 last night .This morning 101. Has been taking Ibuprofen and Tylenol.

## 2015-11-20 NOTE — Discharge Instructions (Signed)
Change packing once daily Use peri-bottle to wash vulva after urination and pat dry Please drink enough liquids to keep urine light yellow to clear. Remove nausea patch behind ear tomorrow. [place] [employer]. Take prescription medicine as prescribed.  Incision and Drainage Incision and drainage is a procedure in which a sac-like structure (cystic structure) is opened and drained. The area to be drained usually contains material such as pus, fluid, or blood.  LET YOUR CAREGIVER KNOW ABOUT:   Allergies to medicine.  Medicines taken, including vitamins, herbs, eyedrops, over-the-counter medicines, and creams.  Use of steroids (by mouth or creams).  Previous problems with anesthetics or numbing medicines.  History of bleeding problems or blood clots.  Previous surgery.  Other health problems, including diabetes and kidney problems.  Possibility of pregnancy, if this applies. RISKS AND COMPLICATIONS  Pain.  Bleeding.  Scarring.  Infection. BEFORE THE PROCEDURE  You may need to have an ultrasound or other imaging tests to see how large or deep your cystic structure is. Blood tests may also be used to determine if you have an infection or how severe the infection is. You may need to have a tetanus shot. PROCEDURE  The affected area is cleaned with a cleaning fluid. The cyst area will then be numbed with a medicine (local anesthetic). A small incision will be made in the cystic structure. A syringe or catheter may be used to drain the contents of the cystic structure, or the contents may be squeezed out. The area will then be flushed with a cleansing solution. After cleansing the area, it is often gently packed with a gauze or another wound dressing. Once it is packed, it will be covered with gauze and tape or some other type of wound dressing. AFTER THE PROCEDURE   Often, you will be allowed to go home right after the procedure.  You may be given antibiotic medicine to prevent or heal an  infection.  If the area was packed with gauze or some other wound dressing, you will likely need to come back in 1 to 2 days to get it removed.  The area should heal in about 14 days.   This information is not intended to replace advice given to you by your health care provider. Make sure you discuss any questions you have with your health care provider.   Document Released: 10/02/2000 Document Revised: 10/08/2011 Document Reviewed: 06/03/2011 Elsevier Interactive Patient Education 2016 [employer]   Abscess An abscess is an infected area that contains a collection of pus and debris.It can occur in almost any part of the body. An abscess is also known as a furuncle or boil. CAUSES  An abscess occurs when tissue gets infected. This can occur from blockage of oil or sweat glands, infection of hair follicles, or a minor injury to the skin. As the body tries to fight the infection, pus collects in the area and creates pressure under the skin. This pressure causes pain. People with weakened immune systems have difficulty fighting infections and get certain abscesses more often.  SYMPTOMS Usually an abscess develops on the skin and becomes a painful mass that is red, warm, and tender. If the abscess forms under the skin, you may feel a moveable soft area under the skin. Some abscesses break open (rupture) on their own, but most will continue to get worse without care. The infection can spread deeper into the body and eventually into the bloodstream, causing you to feel ill.  DIAGNOSIS  Your caregiver  will take your medical history and perform a physical exam. A sample of fluid may also be taken from the abscess to determine what is causing your infection. TREATMENT  Your caregiver may prescribe antibiotic medicines to fight the infection. However, taking antibiotics alone usually does not cure an abscess. Your caregiver may need to make a small cut (incision) in the abscess to drain the pus. In  some cases, gauze is packed into the abscess to reduce pain and to continue draining the area. HOME CARE INSTRUCTIONS   Only take over-the-counter or prescription medicines for pain, discomfort, or fever as directed by your caregiver.  If you were prescribed antibiotics, take them as directed. Finish them even if you start to feel better.  If gauze is used, follow your caregiver's directions for changing the gauze.  To avoid spreading the infection:  Keep your draining abscess covered with a bandage.  Wash your hands well.  Do not share personal care items, towels, or whirlpools with others.  Avoid skin contact with others.  Keep your skin and clothes clean around the abscess.  Keep all follow-up appointments as directed by your caregiver. SEEK MEDICAL CARE IF:   You have increased pain, swelling, redness, fluid drainage, or bleeding.  You have muscle aches, chills, or a general ill feeling.  You have a fever. MAKE SURE YOU:   Understand these instructions.  Will watch your condition.  Will get help right away if you are not doing well or get worse.   This information is not intended to replace advice given to you by your health care provider. Make sure you discuss any questions you have with your health care provider.   Document Released: 01/16/2005 Document Revised: 10/08/2011 Document Reviewed: 06/21/2011 Elsevier Interactive Patient Education 2016 [place] Anesthesia, Adult, Care After Refer to this sheet in the next few weeks. These instructions provide you with information on caring for yourself after your procedure. Your health care provider may also give you more specific instructions. Your treatment has been planned according to current medical practices, but problems sometimes occur. Call your health care provider if you have any problems or questions after your procedure. WHAT TO EXPECT AFTER THE PROCEDURE After the procedure, it is typical to  experience: Sleepiness. Nausea and vomiting. HOME CARE INSTRUCTIONS For the first 24 hours after general anesthesia: Have a responsible person with you. Do not drive a car. If you are alone, do not take public transportation. Do not drink alcohol. Do not take medicine that has not been prescribed by your health care provider. Do not sign important papers or make important decisions. You may resume a normal diet and activities as directed by your health care provider. Change bandages (dressings) as directed. If you have questions or problems that seem related to general anesthesia, call the hospital and ask for the anesthetist or anesthesiologist on call. SEEK MEDICAL CARE IF: You have nausea and vomiting that continue the day after anesthesia. You develop a rash. SEEK IMMEDIATE MEDICAL CARE IF:  You have difficulty breathing. You have chest pain. You have any allergic problems.   This information is not intended to replace advice given to you by your health care provider. Make sure you discuss any questions you have with your health care provider.   Document Released: 07/15/2000 Document Revised: 04/29/2014 Document Reviewed: 08/07/2011 Elsevier Interactive Patient Education [employer].

## 2015-11-20 NOTE — Transfer of Care (Signed)
Immediate Anesthesia Transfer of Care Note  Patient: Tenly Trolinger  Procedure(s) Performed: Procedure(s): INCISION AND DRAINAGE ABSCESS Vulva (N/A)  Patient Location: PACU  Anesthesia Type:General  Level of Consciousness: awake, alert , oriented and patient cooperative  Airway & Oxygen Therapy: Patient Spontanous Breathing and Patient connected to nasal cannula oxygen  Post-op Assessment: Report given to RN, Post -op Vital signs reviewed and stable and Patient moving all extremities X 4  Post vital signs: Reviewed and stable  Last Vitals:  Vitals:   11/20/15 1620  BP: 123/82  Pulse: 115  Resp: 16  Temp: 37.3 C    Last Pain:  Vitals:   11/20/15 1627  TempSrc:   PainSc: 5          Complications: No apparent anesthesia complications

## 2015-11-20 NOTE — Brief Op Note (Signed)
11/20/2015  7:13 PM  PATIENT:  Ebony Stanley  60 y.o. female  PRE-OPERATIVE DIAGNOSIS:  multiple vulvar abcesses  POST-OPERATIVE DIAGNOSIS:  multiple vulvar abcesses  PROCEDURE:  Procedure(s): INCISION AND DRAINAGE ABSCESS Vulva (N/A)  SURGEON:  Surgeon(s) and Role:    * Jerelyn Charles, MD - Primary  ANESTHESIA:   MAC  EBL:  Total I/O In: -  Out: 20 [Blood:20]  BLOOD ADMINISTERED:none  DRAINS: none   LOCAL MEDICATIONS USED:  MARCAINE  10cc  SPECIMEN:  Aspirate  DISPOSITION OF SPECIMEN:  micro  COUNTS:  YES  TOURNIQUET:  * No tourniquets in log *  DICTATION: .Note written in EPIC  PLAN OF CARE: Discharge to home after PACU  PATIENT DISPOSITION:  PACU - hemodynamically stable.   Delay start of Pharmacological VTE agent (>24hrs) due to surgical blood loss or risk of bleeding: not applicable

## 2015-11-20 NOTE — Anesthesia Procedure Notes (Signed)
Procedure Name: LMA Insertion Date/Time: 11/20/2015 6:39 PM Performed by: Gilmer Mor R Pre-anesthesia Checklist: Patient identified, Patient being monitored, Emergency Drugs available, Timeout performed and Suction available Patient Re-evaluated:Patient Re-evaluated prior to inductionOxygen Delivery Method: Circle System Utilized Preoxygenation: Pre-oxygenation with 100% oxygen Intubation Type: IV induction Ventilation: Mask ventilation without difficulty LMA: LMA inserted LMA Size: 4.0 Number of attempts: 1 Placement Confirmation: positive ETCO2 and breath sounds checked- equal and bilateral

## 2015-11-20 NOTE — Anesthesia Preprocedure Evaluation (Signed)
Anesthesia Evaluation  Patient identified by MRN, date of birth, ID band Patient awake    Reviewed: Allergy & Precautions, NPO status , Patient's Chart, lab work & pertinent test results  History of Anesthesia Complications (+) PONV and history of anesthetic complications  Airway Mallampati: II  TM Distance: >3 FB Neck ROM: Full    Dental  (+) Teeth Intact, Dental Advisory Given   Pulmonary asthma ,    Pulmonary exam normal breath sounds clear to auscultation       Cardiovascular negative cardio ROS Normal cardiovascular exam Rhythm:Regular Rate:Normal     Neuro/Psych PSYCHIATRIC DISORDERS Anxiety Depression negative neurological ROS     GI/Hepatic negative GI ROS, Neg liver ROS,   Endo/Other  negative endocrine ROS  Renal/GU negative Renal ROS     Musculoskeletal  (+) Arthritis , Osteoarthritis,    Abdominal   Peds  Hematology negative hematology ROS (+)   Anesthesia Other Findings Day of surgery medications reviewed with the patient.  Reproductive/Obstetrics negative OB ROS                             Anesthesia Physical Anesthesia Plan  ASA: II  Anesthesia Plan: General   Post-op Pain Management:    Induction: Intravenous  Airway Management Planned: Oral ETT  Additional Equipment:   Intra-op Plan:   Post-operative Plan: Extubation in OR  Informed Consent: I have reviewed the patients History and Physical, chart, labs and discussed the procedure including the risks, benefits and alternatives for the proposed anesthesia with the patient or authorized representative who has indicated his/her understanding and acceptance.   Dental advisory given  Plan Discussed with: CRNA  Anesthesia Plan Comments: (Risks/benefits of general anesthesia discussed with patient including risk of damage to teeth, lips, gum, and tongue, nausea/vomiting, allergic reactions to medications, and  the possibility of heart attack, stroke and death.  All patient questions answered.  Patient wishes to proceed.)        Anesthesia Quick Evaluation

## 2015-11-21 LAB — HIV ANTIBODY (ROUTINE TESTING W REFLEX): HIV SCREEN 4TH GENERATION: NONREACTIVE

## 2015-11-21 LAB — HEMOGLOBIN A1C
HEMOGLOBIN A1C: 5.5 % (ref 4.8–5.6)
Mean Plasma Glucose: 111 mg/dL

## 2015-11-21 NOTE — Anesthesia Postprocedure Evaluation (Signed)
Anesthesia Post Note  Patient: Ebony Stanley  Procedure(s) Performed: Procedure(s) (LRB): INCISION AND DRAINAGE ABSCESS Vulva (N/A)  Patient location during evaluation: PACU Anesthesia Type: General Level of consciousness: awake and alert Pain management: pain level controlled Vital Signs Assessment: post-procedure vital signs reviewed and stable Respiratory status: spontaneous breathing, nonlabored ventilation, respiratory function stable and patient connected to nasal cannula oxygen Cardiovascular status: blood pressure returned to baseline and stable Postop Assessment: no signs of nausea or vomiting Anesthetic complications: no     Last Vitals:  Vitals:   11/20/15 2015 11/20/15 2045  BP: 114/69 125/72  Pulse: 100 99  Resp: 10 16  Temp: 36.6 C 36.8 C    Last Pain:  Vitals:   11/20/15 1934  TempSrc:   PainSc: 0-No pain   Pain Goal:                 Jericka Kadar J

## 2015-11-22 NOTE — Op Note (Signed)
Pre-Operative Diagnosis: Multiple vulvar abscess  Postoperative Diagnosis: Same  Procedure: Incision and drainage of vulvar abscesses  Surgeon: Jerelyn Charles, MD  Operative Findings: 4 cm abscess of right labia with significant induration and erythema.  Two abscess of left labia.  One < 1 cm, one 2-3 cm that tracks to inferior labia majora, which is indurated.  All spontaneously drained pus with examination.  On left lower abdomen, 3 cm area of induration, no fluctuance, with surrounding cellulitis.  Specimen: aerobic and anaerobic culture of vulvar abscesses  EBL: 20cc    After adequate anesthesia was achieved, the patient placed in the dorsal lithotomy position in [place].  She was prepped and draped in the usual sterile fashion.  The area on the lower left abdomen was examined.  There was noted to be a 3 cm area of induration with surrounding cellulitis.  There was a questionable area of fluctuance in the center.  An needle was passed twice into the area concerning for possible fluctuance, but no fluid or pus was able to be aspirated; therefore, no incision and drainage was performed on this area.  The attention was then turned to the vulva.  With exam, all three abscesses had come to a head and pus was expressed from all.  10 cc 0.25% marcaine was injected in the 3 abscesses.  On the left vulva, the lower abscess tracked to the inferior aspect of the labia majora, which was significantly indurated.  This was opened with an 11 blade scalpel.  A hemostat was used to break up any areas fluid collection, and the abscess was noted to track to the inferior aspect of the left labia. The smaller abscess on the left labia was less than 1 cm in diameter without surrounding erythema or induration.  This was completely expressed, but not opened due to the small size.  On the right labia majora, there was a  4 cm area of fluctuance without surrounding erythema and induration.  This was opened with an 11  blade.  A hemostat was used to break up any fluid collections. Samples were collected for aerobic and anaerobic cultures and sent to microbiology.  Both areas were irrigated.  Hemostasis was obtained with pressure.  Both incisions were packed with 1/4 in packing.  The patient tolerated the procedure well.  Of note, she had no evidence of allergic reaction to marcaine.  Counts were correct x 3.  She was transferred to PACU in stable condition.  [place], [facility]

## 2015-11-23 ENCOUNTER — Emergency Department (HOSPITAL_COMMUNITY): Payer: BC Managed Care – PPO

## 2015-11-23 ENCOUNTER — Inpatient Hospital Stay (HOSPITAL_COMMUNITY)
Admission: EM | Admit: 2015-11-23 | Discharge: 2015-12-05 | DRG: 854 | Disposition: A | Discharge status: Home Health | Payer: BC Managed Care – PPO | Attending: Family Medicine | Admitting: Family Medicine

## 2015-11-23 ENCOUNTER — Encounter (HOSPITAL_COMMUNITY): Payer: Self-pay | Admitting: Obstetrics

## 2015-11-23 DIAGNOSIS — F329 Major depressive disorder, single episode, unspecified: Secondary | ICD-10-CM | POA: Diagnosis present

## 2015-11-23 DIAGNOSIS — A419 Sepsis, unspecified organism: Secondary | ICD-10-CM | POA: Diagnosis not present

## 2015-11-23 DIAGNOSIS — Z884 Allergy status to anesthetic agent status: Secondary | ICD-10-CM

## 2015-11-23 DIAGNOSIS — L0291 Cutaneous abscess, unspecified: Secondary | ICD-10-CM

## 2015-11-23 DIAGNOSIS — L02411 Cutaneous abscess of right axilla: Secondary | ICD-10-CM | POA: Diagnosis not present

## 2015-11-23 DIAGNOSIS — L03111 Cellulitis of right axilla: Secondary | ICD-10-CM | POA: Diagnosis present

## 2015-11-23 DIAGNOSIS — L02211 Cutaneous abscess of abdominal wall: Secondary | ICD-10-CM | POA: Diagnosis present

## 2015-11-23 DIAGNOSIS — L03311 Cellulitis of abdominal wall: Secondary | ICD-10-CM | POA: Diagnosis present

## 2015-11-23 DIAGNOSIS — Z6834 Body mass index (BMI) 34.0-34.9, adult: Secondary | ICD-10-CM

## 2015-11-23 DIAGNOSIS — Z806 Family history of leukemia: Secondary | ICD-10-CM

## 2015-11-23 DIAGNOSIS — R112 Nausea with vomiting, unspecified: Secondary | ICD-10-CM | POA: Diagnosis not present

## 2015-11-23 DIAGNOSIS — Z79899 Other long term (current) drug therapy: Secondary | ICD-10-CM

## 2015-11-23 DIAGNOSIS — L0390 Cellulitis, unspecified: Secondary | ICD-10-CM | POA: Diagnosis present

## 2015-11-23 DIAGNOSIS — D72829 Elevated white blood cell count, unspecified: Secondary | ICD-10-CM | POA: Diagnosis present

## 2015-11-23 DIAGNOSIS — Z8614 Personal history of Methicillin resistant Staphylococcus aureus infection: Secondary | ICD-10-CM

## 2015-11-23 DIAGNOSIS — B9562 Methicillin resistant Staphylococcus aureus infection as the cause of diseases classified elsewhere: Secondary | ICD-10-CM | POA: Diagnosis present

## 2015-11-23 DIAGNOSIS — Z8041 Family history of malignant neoplasm of ovary: Secondary | ICD-10-CM

## 2015-11-23 DIAGNOSIS — N764 Abscess of vulva: Secondary | ICD-10-CM

## 2015-11-23 DIAGNOSIS — F419 Anxiety disorder, unspecified: Secondary | ICD-10-CM | POA: Diagnosis present

## 2015-11-23 DIAGNOSIS — F32A Depression, unspecified: Secondary | ICD-10-CM | POA: Diagnosis present

## 2015-11-23 DIAGNOSIS — E669 Obesity, unspecified: Secondary | ICD-10-CM | POA: Diagnosis present

## 2015-11-23 DIAGNOSIS — Z888 Allergy status to other drugs, medicaments and biological substances status: Secondary | ICD-10-CM

## 2015-11-23 DIAGNOSIS — D649 Anemia, unspecified: Secondary | ICD-10-CM | POA: Diagnosis present

## 2015-11-23 DIAGNOSIS — E876 Hypokalemia: Secondary | ICD-10-CM | POA: Diagnosis present

## 2015-11-23 DIAGNOSIS — Z22322 Carrier or suspected carrier of Methicillin resistant Staphylococcus aureus: Secondary | ICD-10-CM

## 2015-11-23 DIAGNOSIS — T50905A Adverse effect of unspecified drugs, medicaments and biological substances, initial encounter: Secondary | ICD-10-CM | POA: Diagnosis not present

## 2015-11-23 DIAGNOSIS — J45909 Unspecified asthma, uncomplicated: Secondary | ICD-10-CM | POA: Diagnosis present

## 2015-11-23 HISTORY — DX: Abscess of vulva: N76.4

## 2015-11-23 HISTORY — DX: Personal history of colonic polyps: Z86.010

## 2015-11-23 HISTORY — DX: Anaphylactic shock, unspecified, initial encounter: T78.2XXA

## 2015-11-23 LAB — CBC WITH DIFFERENTIAL/PLATELET
BASOS PCT: 0 %
Basophils Absolute: 0 10*3/uL (ref 0.0–0.1)
EOS ABS: 0.1 10*3/uL (ref 0.0–0.7)
EOS PCT: 1 %
HCT: 35.5 % — ABNORMAL LOW (ref 36.0–46.0)
Hemoglobin: 12.4 g/dL (ref 12.0–15.0)
Lymphocytes Relative: 9 %
Lymphs Abs: 2.4 10*3/uL (ref 0.7–4.0)
MCH: 31.1 pg (ref 26.0–34.0)
MCHC: 34.9 g/dL (ref 30.0–36.0)
MCV: 89 fL (ref 78.0–100.0)
MONO ABS: 1.7 10*3/uL — AB (ref 0.1–1.0)
MONOS PCT: 7 %
Neutro Abs: 22.6 10*3/uL — ABNORMAL HIGH (ref 1.7–7.7)
Neutrophils Relative %: 84 %
Platelets: 461 10*3/uL — ABNORMAL HIGH (ref 150–400)
RBC: 3.99 MIL/uL (ref 3.87–5.11)
RDW: 13.5 % (ref 11.5–15.5)
WBC: 26.9 10*3/uL — ABNORMAL HIGH (ref 4.0–10.5)

## 2015-11-23 LAB — BASIC METABOLIC PANEL
Anion gap: 11 (ref 5–15)
BUN: 13 mg/dL (ref 6–20)
CALCIUM: 9 mg/dL (ref 8.9–10.3)
CO2: 24 mmol/L (ref 22–32)
CREATININE: 0.9 mg/dL (ref 0.44–1.00)
Chloride: 100 mmol/L — ABNORMAL LOW (ref 101–111)
GFR calc non Af Amer: >60 mL/min (ref >60)
GLUCOSE: 93 mg/dL (ref 65–99)
Potassium: 3.1 mmol/L — ABNORMAL LOW (ref 3.5–5.1)
Sodium: 135 mmol/L (ref 135–145)

## 2015-11-23 MED ORDER — POTASSIUM CHLORIDE CRYS ER 20 MEQ PO TBCR
40.0000 meq | EXTENDED_RELEASE_TABLET | Freq: Once | ORAL | Status: AC
  Administered 2015-11-23: 40 meq via ORAL
  Filled 2015-11-23: qty 2

## 2015-11-23 MED ORDER — ENOXAPARIN SODIUM 40 MG/0.4ML ~~LOC~~ SOLN
40.0000 mg | SUBCUTANEOUS | Status: DC
  Administered 2015-11-24 – 2015-12-05 (×11): 40 mg via SUBCUTANEOUS
  Filled 2015-11-23 (×11): qty 0.4

## 2015-11-23 MED ORDER — LIP MEDEX EX OINT
1.0000 "application " | TOPICAL_OINTMENT | Freq: Two times a day (BID) | CUTANEOUS | Status: DC
  Administered 2015-11-23 – 2015-12-05 (×21): 1 via TOPICAL
  Filled 2015-11-23 (×5): qty 7

## 2015-11-23 MED ORDER — ENSURE ENLIVE PO LIQD: 237.0000 mL | Freq: Two times a day (BID) | ORAL | Status: DC

## 2015-11-23 MED ORDER — ADULT MULTIVITAMIN W/MINERALS CH: 1.0000 | ORAL_TABLET | Freq: Every day | ORAL | Status: DC

## 2015-11-23 MED ORDER — VANCOMYCIN HCL IN DEXTROSE 1-5 GM/200ML-% IV SOLN
1000.0000 mg | Freq: Two times a day (BID) | INTRAVENOUS | Status: DC
  Administered 2015-11-24 – 2015-11-29 (×12): 1000 mg via INTRAVENOUS
  Filled 2015-11-23 (×13): qty 200

## 2015-11-23 MED ORDER — HYDROMORPHONE HCL 1 MG/ML IJ SOLN
0.5000 mg | INTRAMUSCULAR | Status: DC | PRN
  Administered 2015-11-23: 0.5 mg via INTRAVENOUS
  Administered 2015-11-24 – 2015-11-27 (×9): 1 mg via INTRAVENOUS
  Administered 2015-11-28: 2 mg via INTRAVENOUS
  Administered 2015-11-28 – 2015-11-29 (×3): 1 mg via INTRAVENOUS
  Administered 2015-11-29: 2 mg via INTRAVENOUS
  Administered 2015-11-29: 1 mg via INTRAVENOUS
  Administered 2015-11-30: 2 mg via INTRAVENOUS
  Administered 2015-12-01: 1 mg via INTRAVENOUS
  Administered 2015-12-01 (×2): 2 mg via INTRAVENOUS
  Administered 2015-12-02: 1 mg via INTRAVENOUS
  Administered 2015-12-03: 2 mg via INTRAVENOUS
  Administered 2015-12-03 – 2015-12-05 (×3): 1 mg via INTRAVENOUS
  Filled 2015-11-23: qty 2
  Filled 2015-11-23 (×5): qty 1
  Filled 2015-11-23: qty 2
  Filled 2015-11-23 (×4): qty 1
  Filled 2015-11-23 (×2): qty 2
  Filled 2015-11-23 (×3): qty 1
  Filled 2015-11-23: qty 2
  Filled 2015-11-23 (×3): qty 1
  Filled 2015-11-23: qty 2
  Filled 2015-11-23 (×5): qty 1
  Filled 2015-11-23: qty 2
  Filled 2015-11-23: qty 1

## 2015-11-23 MED ORDER — METOPROLOL TARTRATE 5 MG/5ML IV SOLN
5.0000 mg | Freq: Four times a day (QID) | INTRAVENOUS | Status: DC | PRN
  Filled 2015-11-23: qty 5

## 2015-11-23 MED ORDER — MORPHINE SULFATE (PF) 4 MG/ML IV SOLN
4.0000 mg | Freq: Once | INTRAVENOUS | Status: DC
  Filled 2015-11-23: qty 1

## 2015-11-23 MED ORDER — LACTATED RINGERS IV BOLUS (SEPSIS): 1000.0000 mL | Freq: Three times a day (TID) | INTRAVENOUS | Status: DC | PRN

## 2015-11-23 MED ORDER — VITAMIN C 500 MG PO TABS
500.0000 mg | ORAL_TABLET | Freq: Two times a day (BID) | ORAL | Status: DC
  Administered 2015-11-23 – 2015-12-05 (×24): 500 mg via ORAL
  Filled 2015-11-23 (×25): qty 1

## 2015-11-23 MED ORDER — ALUM & MAG HYDROXIDE-SIMETH 200-200-20 MG/5ML PO SUSP
30.0000 mL | Freq: Four times a day (QID) | ORAL | Status: DC | PRN
  Administered 2015-11-24 – 2015-11-27 (×3): 30 mL via ORAL
  Filled 2015-11-23 (×3): qty 30

## 2015-11-23 MED ORDER — ACETAMINOPHEN 500 MG PO TABS
1000.0000 mg | ORAL_TABLET | Freq: Three times a day (TID) | ORAL | Status: DC
  Administered 2015-11-23 – 2015-11-28 (×13): 1000 mg via ORAL
  Filled 2015-11-23 (×16): qty 2

## 2015-11-23 MED ORDER — PIPERACILLIN-TAZOBACTAM 3.375 G IVPB 30 MIN
3.3750 g | Freq: Once | INTRAVENOUS | Status: AC
  Administered 2015-11-23: 3.375 g via INTRAVENOUS
  Filled 2015-11-23 (×2): qty 50

## 2015-11-23 MED ORDER — POTASSIUM CHLORIDE CRYS ER 20 MEQ PO TBCR: 40.0000 meq | EXTENDED_RELEASE_TABLET | Freq: Two times a day (BID) | ORAL | Status: DC

## 2015-11-23 MED ORDER — PIPERACILLIN-TAZOBACTAM 3.375 G IVPB
3.3750 g | Freq: Three times a day (TID) | INTRAVENOUS | Status: DC
  Administered 2015-11-24 (×2): 3.375 g via INTRAVENOUS
  Filled 2015-11-23 (×4): qty 50

## 2015-11-23 MED ORDER — DIPHENHYDRAMINE HCL 50 MG/ML IJ SOLN
12.5000 mg | Freq: Four times a day (QID) | INTRAMUSCULAR | Status: DC | PRN
  Administered 2015-11-24 – 2015-11-28 (×3): 25 mg via INTRAVENOUS
  Filled 2015-11-23 (×3): qty 1

## 2015-11-23 MED ORDER — METOPROLOL TARTRATE 12.5 MG HALF TABLET: 12.5000 mg | ORAL_TABLET | Freq: Two times a day (BID) | ORAL | Status: DC | PRN

## 2015-11-23 MED ORDER — SODIUM CHLORIDE 0.9 % IV BOLUS (SEPSIS)
1000.0000 mL | Freq: Once | INTRAVENOUS | Status: AC
  Administered 2015-11-23: 1000 mL via INTRAVENOUS

## 2015-11-23 MED ORDER — SENNA 8.6 MG PO TABS
1.0000 | ORAL_TABLET | Freq: Two times a day (BID) | ORAL | Status: DC
  Administered 2015-11-24 – 2015-12-05 (×22): 8.6 mg via ORAL
  Filled 2015-11-23 (×24): qty 1

## 2015-11-23 MED ORDER — CLINDAMYCIN PHOSPHATE 600 MG/50ML IV SOLN
600.0000 mg | Freq: Once | INTRAVENOUS | Status: DC
  Filled 2015-11-23: qty 50

## 2015-11-23 MED ORDER — VANCOMYCIN HCL IN DEXTROSE 1-5 GM/200ML-% IV SOLN
1000.0000 mg | Freq: Once | INTRAVENOUS | Status: AC
  Administered 2015-11-23: 1000 mg via INTRAVENOUS
  Filled 2015-11-23 (×2): qty 200

## 2015-11-23 MED ORDER — ONDANSETRON HCL 4 MG/2ML IJ SOLN
4.0000 mg | Freq: Four times a day (QID) | INTRAMUSCULAR | Status: DC | PRN
  Administered 2015-11-24 – 2015-12-03 (×7): 4 mg via INTRAVENOUS
  Filled 2015-11-23 (×6): qty 2

## 2015-11-23 MED ORDER — BUPROPION HCL ER (XL) 150 MG PO TB24
150.0000 mg | ORAL_TABLET | Freq: Every day | ORAL | Status: DC
  Administered 2015-11-25 – 2015-12-05 (×11): 150 mg via ORAL
  Filled 2015-11-23 (×13): qty 1

## 2015-11-23 MED ORDER — MAGIC MOUTHWASH
15.0000 mL | Freq: Four times a day (QID) | ORAL | Status: DC | PRN
  Filled 2015-11-23: qty 15

## 2015-11-23 MED ORDER — MUPIROCIN 2 % EX OINT
1.0000 "application " | TOPICAL_OINTMENT | Freq: Two times a day (BID) | CUTANEOUS | Status: AC
  Administered 2015-11-23 – 2015-11-28 (×10): 1 via NASAL
  Filled 2015-11-23 (×3): qty 22

## 2015-11-23 MED ORDER — ONDANSETRON HCL 40 MG/20ML IJ SOLN
8.0000 mg | Freq: Four times a day (QID) | INTRAMUSCULAR | Status: DC | PRN
  Filled 2015-11-23: qty 4

## 2015-11-23 MED ORDER — PROGESTERONE MICRONIZED 100 MG PO CAPS: 100.0000 mg | ORAL_CAPSULE | Freq: Every day | ORAL | Status: DC

## 2015-11-23 MED ORDER — OXYCODONE HCL 5 MG PO TABS: 5.0000 mg | ORAL_TABLET | ORAL | Status: DC | PRN

## 2015-11-23 MED ORDER — CHLORHEXIDINE GLUCONATE CLOTH 2 % EX PADS
6.0000 | MEDICATED_PAD | Freq: Every day | CUTANEOUS | Status: DC
  Administered 2015-11-23 – 2015-11-28 (×4): 6 via TOPICAL

## 2015-11-23 MED ORDER — PHENOL 1.4 % MT LIQD: 2.0000 | OROMUCOSAL | Status: DC | PRN

## 2015-11-23 MED ORDER — LACTATED RINGERS IV SOLN
INTRAVENOUS | Status: DC
  Administered 2015-11-24: via INTRAVENOUS

## 2015-11-23 MED ORDER — ESTRADIOL 1 MG PO TABS: 1.0000 mg | ORAL_TABLET | Freq: Every day | ORAL | Status: DC

## 2015-11-23 MED ORDER — MENTHOL 3 MG MT LOZG: 1.0000 | LOZENGE | OROMUCOSAL | Status: DC | PRN

## 2015-11-23 NOTE — ED Provider Notes (Signed)
[facility] DEPT Provider Note   CSN: [id number] Arrival date & time: 11/23/15  1706  First Provider Contact:   First MD Initiated Contact with Patient 11/23/15 1828     By signing my name below, I, Soijett Blue, attest that this documentation has been prepared under the direction and in the presence of Clayton Bibles, PA-C Electronically Signed: Soijett Blue, ED Scribe. 11/23/15. 7:46 PM.    History   Chief Complaint No chief complaint on file.   HPI Ebony Stanley is a 60 y.o. female who presents to the Emergency Department complaining of abscess to right axilla onset 1 week. Pt states that the right axilla abscess was initially pea size and has progressively increased in size. Pt notes that she recently had I&D surgery completed for vulvar abscesses 4 days ago and she was Rx bactrim, oxycodone, and 600 mg ibuprofen. Pt states that she followed up with her GYN today and informed that the area was healing well. Pt was informed by her to come into the ED for further evaluation of her right axilla and lower abdominal skin infection. Pt is having associated symptoms of abscess to lower abdomen and redness to right axilla and lower abdomen, intermittent fever of 102, chills, body aches, constipation x 4 days, and lower abdominal pain. She notes that she has tried sitz baths without medications for the relief of her symptoms. She denies drainage, CP, SOB, urinary symptoms, and any other symptoms.   The history is provided by the patient. No language interpreter was used.    Past Medical History:  Diagnosis Date  . Anxiety   . Arthritis   . Asthma    when pregnant  . Complication of anesthesia    unknown if Lidocaine was the cause of the anaphylaxis  . Constipation   . Depression   . Heart murmur    as child    Patient Active Problem List   Diagnosis Date Noted  . Anaphylactic reaction   . Anaphylactic shock 07/18/2015  . Family history of ovarian cancer 11/30/2012  . Personal  history of colonic adenomas 06/12/2012  . Unspecified constipation 06/12/2012  . Insomnia 05/23/2011  . Depression 05/23/2011    Past Surgical History:  Procedure Laterality Date  . [place]  . INCISION AND DRAINAGE ABSCESS N/A 11/20/2015   Procedure: INCISION AND DRAINAGE ABSCESS Vulva;  Surgeon: Jerelyn Charles, MD;  Location: [facility];  Service: Gynecology;  Laterality: N/A;  . KNEE ARTHROSCOPY Right    x 2  . TONSILLECTOMY    . WRIST SURGERY      OB History    Gravida Para Term Preterm AB Living   7 2 2   4 2    SAB TAB Ectopic Multiple Live Births   1               Home Medications    Prior to Admission medications   Medication Sig Start Date End Date Taking? Authorizing Provider  buPROPion (WELLBUTRIN XL) 150 MG 24 hr tablet TAKE 1 TABLET (150 MG TOTAL) BY MOUTH DAILY. 12/06/13   [place], MD  estradiol (ESTRACE) 1 MG tablet TAKE 1 TABLET (1 MG TOTAL) BY MOUTH DAILY.    Brook E Yisroel Ramming, MD  ibuprofen (ADVIL,MOTRIN) 600 MG tablet Take 1 tablet (600 mg total) by mouth every 6 (six) hours. 11/20/15   Jerelyn Charles, MD  oxyCODONE-acetaminophen (PERCOCET/ROXICET) 5-325 MG tablet Take 1 tablet by mouth every 6 (six) hours as  needed for severe pain. 11/20/15   Jerelyn Charles, MD  potassium chloride (KLOR-CON M15) 15 MEQ tablet Take 2 tablets (30 mEq total) by mouth every hour. Patient taking differently: Take 15 mEq by mouth daily as needed (cramps).  07/21/15   Grace Bushy Minor, NP  progesterone (PROMETRIUM) 100 MG capsule TAKE 1 CAPSULE (100 MG TOTAL) BY MOUTH DAILY. 12/17/13   Brook Oletta Lamas, MD  senna (SENOKOT) 8.6 MG tablet Take 1 tablet by mouth 2 (two) times daily.    Historical Provider, MD  sulfamethoxazole-trimethoprim (BACTRIM DS,SEPTRA DS) 800-160 MG tablet Take 1 tablet by mouth 2 (two) times daily. 11/20/15 11/27/15  Jerelyn Charles, MD    Family History Family History  Problem Relation Age of Onset  . Ovarian cancer Mother 82  .  Hyperlipidemia Mother   . Alcohol abuse Father   . Hyperlipidemia Father   . Leukemia Paternal Grandmother   . Bipolar disorder Daughter   . Colon cancer Neg Hx     Social History Social History  Substance Use Topics  . Smoking status: Never Smoker  . Smokeless tobacco: Never Used  . Alcohol use Yes     Comment: very rare     Allergies   Kenalog [triamcinolone] and Lidocaine   Review of Systems Review of Systems  Constitutional: Positive for chills and fever.  Respiratory: Negative for shortness of breath.   Cardiovascular: Negative for chest pain.  Gastrointestinal: Positive for abdominal pain (lower) and constipation.  Genitourinary: Negative for difficulty urinating, dysuria and hematuria.  Musculoskeletal: Positive for myalgias.  Skin: Positive for color change (redness to right axilla and lower abdomen).       Abscess to right axilla and lower abdomen without drainage.  All other systems reviewed and are negative.    Physical Exam Updated Vital Signs BP 104/62 (BP Location: Left Arm)   Pulse 109   Temp 98.3 F (36.8 C) (Oral)   Resp 20   LMP 04/22/2006   SpO2 96%   Physical Exam  Constitutional: She is oriented to person, place, and time. She appears well-developed and well-nourished. No distress.  HENT:  Head: Normocephalic and atraumatic.  Eyes: EOM are normal.  Neck: Neck supple.  Cardiovascular: Normal rate, regular rhythm and normal heart sounds.  Exam reveals no gallop and no friction rub.   No murmur heard. Pulmonary/Chest: Effort normal and breath sounds normal. No respiratory distress. She has no wheezes. She has no rales.  Abdominal: Soft. She exhibits no distension.  Large area of erythema with warmth. Tenderness overlying lower abdomen with associated induration left > right.   Musculoskeletal: Normal range of motion.  Neurological: She is alert and oriented to person, place, and time.  Skin: Skin is warm and dry. There is erythema.  Large  area in the right axilla with erythema, warmth, tenderness, and fluctuance. No active drainage.   Psychiatric: She has a normal mood and affect. Her behavior is normal.  Nursing note and vitals reviewed.    ED Treatments / Results  DIAGNOSTIC STUDIES: Oxygen Saturation is 96% on RA, nl by my interpretation.    COORDINATION OF CARE: 7:38 PM Discussed treatment plan with pt at bedside which includes vancomycin, consult to general surgery, morphine, labs, US abdomen limited, [place] chest, and US pelvis, and pt agreed to plan.    Labs (all labs ordered are listed, but only abnormal results are displayed) Labs Reviewed  BASIC METABOLIC PANEL - Abnormal; Notable for the following:  Result Value   Potassium 3.1 (*)    Chloride 100 (*)    All other components within normal limits  CBC WITH DIFFERENTIAL/PLATELET - Abnormal; Notable for the following:    WBC 26.9 (*)    HCT 35.5 (*)    Platelets 461 (*)    Neutro Abs 22.6 (*)    Monocytes Absolute 1.7 (*)    All other components within normal limits    EKG  EKG Interpretation None       Radiology No results found.  Procedures Procedures (including critical care time)  Medications Ordered in ED Medications - No data to display   Initial Impression / Assessment and Plan / ED Course  I have reviewed the triage vital signs and the nursing notes.  Pertinent labs & imaging results that were available during my care of the patient were reviewed by me and considered in my medical decision making (see chart for details).  Clinical Course  Comment By Time  Patient also seen and examined by Dr Tamera Punt.  Will US abdomen to r/o abscess, call general surgery for consultation and admit to medicine for abscess and cellulitis with failure of outpatient treatment.   Clayton Bibles, PA-C 08/03 1938  Discussed pt with Dr Johney Maine who will see patient in consult  Clayton Bibles, PA-C 08/03 1958  Pt seen by Dr Johney Maine.  Please see his note for further  evaluation Clayton Bibles, PA-C 08/03 2058    Patient with failed outpatient treatment of right axilla and lower abdominal cellulitis.  Recent surgery by GYN for vulvar abscesses.  Culture grew MRSA.  Pt is immunocompetent.  Vanc, zosyn started in ED.  Seen by Dr Johney Maine in ED, please see his note for further recommendations.  Admitted to [facility], Dr Lorin Mercy accepting.    Final Clinical Impressions(s) / ED Diagnoses   Final diagnoses:  Abscess  Cellulitis and abscess    New Prescriptions New Prescriptions   No medications on file    I personally performed the services described in this documentation, which was scribed in my presence. The recorded information has been reviewed and is accurate.     Clayton Bibles, PA-C 11/23/15 2107    Malvin Johns, MD 11/23/15 [phone]

## 2015-11-23 NOTE — Consult Note (Signed)
[place]  [address] [place], [place] [id number] Phone: [phone] FAX: [phone]     Ebony Stanley  04-15-1956 [id number]  CARE TEAM:  PCP: Robyn Haber, MD  Outpatient Care Team: Patient Care Team: Robyn Haber, MD as PCP - General (Family Medicine)  Inpatient Treatment Team: Treatment Team: Attending Provider: Malvin Johns, MD; Physician Assistant: Clayton Bibles, PA-C; Consulting Physician: Nolon Nations, MD; Registered Nurse: Leanna Battles, RN  This patient is a 60 y.o.female who presents today for surgical evaluation at the request of [place], [place].  Reason for evaluation: Cellulitis, possible abscesses  Pleasant obese female who developed areas of redness and cellulitis.  Perineal fluctuant areas on the vulva that required incision and drainage for days ago.  Methicillin-resistant staph aureus.  Transitioned to oral antibiotics.  Apparently she was getting a little bit of redness on her abdomen and her right armpit.  Not too severe.  We will still not enough to be requiring incision and drainage.  However the redness is worsened over the past three days.  Concern tear.  Came to the emergency room.  Quite uncomfortable.  Right armpit swollen.  Redness much worse in the abdomen as well.  On surgical consultation is requested.  Ultrasound just done.  Preliminary report shows no deep abscess cavity in the right axilla and maybe a small deep one in the abdomen.  Patient is not diabetic.  She does not smoke.  Apparently had a workup was negative for HIV as well.  She is not on steroids.  No history of immunosuppression.  She is a history of colon polyps.  Last colonoscopy three years ago with a solitary tubular adenoma.  Due to follow-up colonoscopy in 2019  Past Medical History:  Diagnosis Date  . Anxiety   . Arthritis   . Asthma    when pregnant  . Complication of anesthesia    unknown if Lidocaine was the cause of  the anaphylaxis  . Constipation   . Depression   . Heart murmur    as child    Past Surgical History:  Procedure Laterality Date  . [place]  . INCISION AND DRAINAGE ABSCESS N/A 11/20/2015   Procedure: INCISION AND DRAINAGE ABSCESS Vulva;  Surgeon: Jerelyn Charles, MD;  Location: [facility];  Service: Gynecology;  Laterality: N/A;  . KNEE ARTHROSCOPY Right    x 2  . TONSILLECTOMY    . WRIST SURGERY      Social History   Social History  . Marital status: Married    Spouse name: N/A  . Number of children: 2  . 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 Smoking status: Never Smoker  . Smokeless tobacco: Never Used  . Alcohol use Yes     Comment: very rare  . Drug use: No  . Sexual activity: Yes    Birth control/ protection: Post-menopausal     Comment: G8- has 2 childen, 1sab, 4tab   Other Topics Concern  . Not on file   Social History Narrative  . No narrative on file    Family History  Problem Relation Age of Onset  . Ovarian cancer Mother 30  . Hyperlipidemia Mother   . Alcohol abuse Father   . Hyperlipidemia Father   . Leukemia Paternal Grandmother   . Bipolar disorder Daughter   . Colon cancer Neg Hx  Current Facility-Administered Medications  Medication Dose Route Frequency Provider Last Rate Last Dose  . morphine 4 MG/ML injection 4 mg  4 mg Intravenous Once [place], PA-C   Stopped at 11/23/15 2003  . piperacillin-tazobactam (ZOSYN) IVPB 3.375 g  3.375 g Intravenous Once Emily West, PA-C      . vancomycin (VANCOCIN) IVPB 1000 mg/200 mL premix  1,000 mg Intravenous Once Emily West, PA-C 200 mL/hr at 11/23/15 2003 1,000 mg at 11/23/15 2003   Current Outpatient Prescriptions  Medication Sig Dispense Refill  . buPROPion (WELLBUTRIN XL) 150 MG 24 hr tablet TAKE 1 TABLET (150 MG TOTAL) BY MOUTH DAILY. 30 tablet 0  . estradiol (ESTRACE) 1 MG tablet TAKE 1  TABLET (1 MG TOTAL) BY MOUTH DAILY. 30 tablet 0  . ibuprofen (ADVIL,MOTRIN) 600 MG tablet Take 1 tablet (600 mg total) by mouth every 6 (six) hours. 40 tablet 0  . oxyCODONE-acetaminophen (PERCOCET/ROXICET) 5-325 MG tablet Take 1 tablet by mouth every 6 (six) hours as needed for severe pain. 30 tablet 0  . potassium chloride (KLOR-CON M15) 15 MEQ tablet Take 2 tablets (30 mEq total) by mouth every hour. (Patient taking differently: Take 15 mEq by mouth daily as needed (cramps). )    . progesterone (PROMETRIUM) 100 MG capsule TAKE 1 CAPSULE (100 MG TOTAL) BY MOUTH DAILY. 30 capsule 0  . senna (SENOKOT) 8.6 MG tablet Take 1 tablet by mouth 2 (two) times daily.    Marland Kitchen sulfamethoxazole-trimethoprim (BACTRIM DS,SEPTRA DS) 800-160 MG tablet Take 1 tablet by mouth 2 (two) times daily. 14 tablet 0     Allergies  Allergen Reactions  . Kenalog [Triamcinolone] Anaphylaxis    Was mixed with lidocaine, unsure which caused the reaction   . Lidocaine Anaphylaxis    Was mixed with the Kenalog, unsure which medication caused the reaction Pt repots she received Lidocaine since that time without trouble.    ROS: Constitutional:  No fevers, chills, sweats.  Weight stable Eyes:  No vision changes, No discharge HENT:  No sore throats, nasal drainage Lymph: No neck swelling, No bruising easily Pulmonary:  No cough, productive sputum CV: No orthopnea, PND  Patient walks 30 minutes for about 1 miles without difficulty.  No exertional chest/neck/shoulder/arm pain. GI: No personal nor family history of GI/colon cancer, inflammatory bowel disease, irritable bowel syndrome, allergy such as Celiac Sprue, dietary/dairy problems, colitis, ulcers nor gastritis.  No recent sick contacts/gastroenteritis.  No travel outside the country.  No changes in diet. Renal: No UTIs, No hematuria Genital:  No drainage, bleeding, masses Musculoskeletal: No severe joint pain.  Good ROM major joints Skin:  No sores or lesions.  No  rashes Heme/Lymph:  No easy bleeding.  No swollen lymph nodes Neuro: No focal weakness/numbness.  No seizures Psych: No suicidal ideation.  No hallucinations  BP 104/62 (BP Location: Left Arm)   Pulse 109   Temp 98.3 F (36.8 C) (Oral)   Resp 20   LMP 04/22/2006   SpO2 96%   Physical Exam: General: Pt awake/alert/oriented x4 in no major acute distress Eyes: PERRL, normal EOM. Sclera nonicteric Neuro: CN II-XII intact w/o focal sensory/motor deficits. Lymph: No head/neck/groin lymphadenopathy Psych:  No delerium/psychosis/paranoia.  Mildly anxious but consolable HENT: Normocephalic, Mucus membranes moist.  No thrush Neck: Supple, No tracheal deviation Chest: No pain.  Good respiratory excursion.  Right axilla with 15x12 cm area of bright red erythema and fullness.  Caring onto lateral breast as well.  No scarring or dimpling.  No  nipple discharge.  No discrete breast mass.  No deep fluctuance though.  No crepitus No evidence of anything coming to a head.  No prior scarring or nodularity.  Not consistent with hidradenitis.   Tender.  Left axilla much less elevated without any inflammation.  No evidence of hidradenitis  CV:  Pulses intact.  Regular rhythm  Abdomen:   Infraumbilical central region of erythema 20 x 8 cm.  One small tiny superficial pustule.  No deeper fluctuance.  Mostly sloughed.  No Polaroid.  No specific area of fluctuance.  Otherwise rest of abdomen is soft, Nondistended.  No evidence of guarding or peritonitis.  No crepitus.  No incarcerated hernias.   Genital deferred given evaluation by gynecologist just a few days ago. Ext:  SCDs BLE.  No significant edema.  No cyanosis  Skin: No petechiae / purpurea.  No major sores  Musculoskeletal: No severe joint pain.  Good ROM major joints   Results:   Labs: No results found for this or any previous visit (from the past 48 hour(s)).  Imaging / Studies: No results found.  Medications / Allergies: per  chart  Antibiotics: Anti-infectives    Start     Dose/Rate Route Frequency Ordered Stop   11/23/15 2000  piperacillin-tazobactam (ZOSYN) IVPB 3.375 g     3.375 g 100 mL/hr over 30 Minutes Intravenous  Once 11/23/15 1957     11/23/15 1945  vancomycin (VANCOCIN) IVPB 1000 mg/200 mL premix     1,000 mg 200 mL/hr over 60 Minutes Intravenous  Once 11/23/15 1936     11/23/15 1930  clindamycin (CLEOCIN) IVPB 600 mg  Status:  Discontinued     600 mg 100 mL/hr over 30 Minutes Intravenous  Once 11/23/15 1920 11/23/15 1936      Assessment  Ebony Stanley  60 y.o. female       Problem List:  Principal Problem:   Cellulitis of right axilla Active Problems:   Abscess of vulva s/p I&D   Cellulitis of abdominal wall   Obesity (BMI 30-39.9)   Worsening cellulitis of axilla and abdomen in patient with history of MRSA.  Plan:  -IV antibiotics.  Would do vancomycin and Zosyn.  MRSA protocol with decolonization and precautions.  Regular reevaluation.  There is no specific area of fluctuance that would benefit from incision and drainage at this time.  However things may become more organized and she may have a more focal erythema that would be more consistent with attempted abscess drainage.  We will make her nothing by mouth after midnight and reevaluate in the morning to see if incision and drainage is warranted.  May benefit from infectious disease consultation given this it cellulitis in a patient and otherwise is not high risk.  She may require more prolonged antibiotics since she worsened on oral therapy.  May benefit from infectious disease consultation.  Will defer to admitting medicine service.  -VTE prophylaxis- SCDs, etc  -mobilize as tolerated to help recovery    [name], M.D., F.A.C.S. Gastrointestinal and Minimally Invasive Surgery [facility] [address], [place], [place] [address] [phone] Main /  Paging   11/23/2015  Note: Portions of this report may have been transcribed using voice recognition software. Every effort was made to ensure accuracy; however, inadvertent computerized transcription errors may be present.   Any transcriptional errors that result from this process are unintentional.

## 2015-11-23 NOTE — ED Notes (Signed)
Pt's wound under her right arm is measured 7 inches by 6 inches. Wound at abdomen is 11.5 inches by 2.5 inches

## 2015-11-23 NOTE — H&P (Signed)
History and Physical    Ebony Stanley [id number] DOB: 1955/07/09 DOA: 11/23/2015  PCP: Robyn Haber, MD Consultants:  [facility]ka - ortho; [facility] - OB/GYN Patient coming from:  Home - lives with daughter and granddaughter, husband just moved to [facility] for work  Chief Complaint: skin infections  HPI: Ebony Stanley is a 60 y.o. female with an uncomplicated medical history presenting with diffuse MRSA infection.  Went to see  Dr. Philis Pique last Thursday for external itch in groin area, thought to be due to shaving.  Noticed a lump on labia Wednesday night prior to already scheduled appoitment.  Was prescribed a cream and recognized abscess but not ready for drainage.  Did hot sitz baths.  Also noticed a lesion under R axilla.  Using hot compresses on that too.  On Saturday, noticed a lesion on her abdomen.  On Monday, miserable with groin lesion she went back to OB/GYN and was taken to [facility] for I&D of labia abscesses (3 of them).  Went today for f/u and everything looked good, packing replaced.  But she showed her R axillary region to the physician and was sent to ER for gen surgery consult.  Very big and hard under R axilla with surrounding redness.  +fever to 102 on Sunday/Monday.  Ongoing Tuesday and Wednesday.   ED Course: Multiple areas of cellulitis and possible abscess; given vancomycin, GS consult, morphine, labs, US abdomen/chest/pelvis  Review of Systems: As per HPI; otherwise 10 point review of systems reviewed and negative.   Ambulatory Status:  Ambulates without difficulty but walks slowly due to labial abscesses; difficulty lying arm down due to abscess  Past Medical History:  Diagnosis Date  . Abscess of vulva 11/23/2015  . Anaphylactic reaction   . Anxiety   . Arthritis   . Asthma    when pregnant  . Complication of anesthesia    unknown if Lidocaine was the cause of the anaphylaxis  . Constipation   . Depression   . Heart murmur    as child  . Personal  history of colonic adenomas 06/12/2012   history in 2011 with 2 adenomas Ferdinand Lango) 06/15/2012 cecal polyp 7 mm adenoma      Past Surgical History:  Procedure Laterality Date  . [place]  . INCISION AND DRAINAGE ABSCESS N/A 11/20/2015   Procedure: INCISION AND DRAINAGE ABSCESS Vulva;  Surgeon: Jerelyn Charles, MD;  Location: [facility];  Service: Gynecology;  Laterality: N/A;  . KNEE ARTHROSCOPY Right    x 2  . TONSILLECTOMY    . WRIST SURGERY      Social History   Social History  . Marital status: Married    Spouse name: N/A  . Number of children: 2  . 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 Smoking status: Never Smoker  . Smokeless tobacco: Never Used  . Alcohol use Yes     Comment: very rare  . Drug use: No  . Sexual activity: Yes    Birth control/ protection: Post-menopausal     Comment: G8- has 2 childen, 1sab, 4tab   Other Topics Concern  . Not on file   Social History Narrative  . No narrative on file    Allergies  Allergen Reactions  . Kenalog [Triamcinolone] Anaphylaxis    Was mixed with lidocaine, unsure which caused the reaction   . Lidocaine Anaphylaxis    Was mixed with the Kenalog,  unsure which medication caused the reaction Pt repots she received Lidocaine since that time without trouble.    Family History  Problem Relation Age of Onset  . Ovarian cancer Mother 76  . Hyperlipidemia Mother   . Alcohol abuse Father   . Hyperlipidemia Father   . Leukemia Paternal Grandmother   . Bipolar disorder Daughter   . Colon cancer Neg Hx     Prior to Admission medications   Medication Sig Start Date End Date Taking? Authorizing Provider  buPROPion (WELLBUTRIN XL) 150 MG 24 hr tablet TAKE 1 TABLET (150 MG TOTAL) BY MOUTH DAILY. 12/06/13  Yes Maynard, MD  celecoxib (CELEBREX) 200 MG capsule Take 200 mg by mouth every evening.  10/22/15  Yes Historical  Provider, MD  estradiol (ESTRACE) 1 MG tablet TAKE 1 TABLET (1 MG TOTAL) BY MOUTH DAILY.   Yes Brook Oletta Lamas, MD  ibuprofen (ADVIL,MOTRIN) 600 MG tablet Take 1 tablet (600 mg total) by mouth every 6 (six) hours. 11/20/15  Yes Jerelyn Charles, MD  oxyCODONE-acetaminophen (PERCOCET/ROXICET) 5-325 MG tablet Take 1 tablet by mouth every 6 (six) hours as needed for severe pain. 11/20/15  Yes Jerelyn Charles, MD  progesterone (PROMETRIUM) 100 MG capsule TAKE 1 CAPSULE (100 MG TOTAL) BY MOUTH DAILY. 12/17/13  Yes Brook E Yisroel Ramming, MD  senna (SENOKOT) 8.6 MG tablet Take 1 tablet by mouth 2 (two) times daily.   Yes Historical Provider, MD  sulfamethoxazole-trimethoprim (BACTRIM DS,SEPTRA DS) 800-160 MG tablet Take 1 tablet by mouth 2 (two) times daily. 11/20/15 11/27/15 Yes Jerelyn Charles, MD  potassium chloride (KLOR-CON M15) 15 MEQ tablet Take 2 tablets (30 mEq total) by mouth every hour. Patient taking differently: Take 15 mEq by mouth daily as needed (cramps).  07/21/15   Grace Bushy Minor, NP    Physical Exam: Vitals:   11/23/15 1709 11/23/15 1800  BP: 112/60 104/62  Pulse: 115 109  Resp: 20 20  Temp: 98.8 F (37.1 C) 98.3 F (36.8 C)  TempSrc: Oral Oral  SpO2: 96% 96%     General: Appears calm but uncomfortable and is NAD; hard to find a position that does not exacerbate pain Eyes:  PERRL, EOMI, normal lids, iris ENT:  grossly normal hearing, lips & tongue, mmm Neck:  no LAD, masses or thyromegaly Cardiovascular:  RRR, no m/r/g. No LE edema.  Respiratory:  CTA bilaterally, no w/r/r. Normal respiratory effort. Abdomen:  soft, ntnd, NABS; large area of firmness/ erythema extending diffusely across lower pannus with fairly distinct borders, no organized fluctuance, +warmth and TTP Skin: also with area of firmness and erythema/warmth extending along entire R chest from just below axilla and onto the right breast.  Does not appear to involve the axilla itself and there is no obvious  hidradenitis.  Again, no organized fluctuance. Musculoskeletal:  grossly normal tone BUE/BLE, good ROM, no bony abnormality Psychiatric:  grossly normal mood and affect, speech fluent and appropriate, AOx3 Neurologic:  CN 2-12 grossly intact, moves all extremities in coordinated fashion, sensation intact  Labs on Admission: I have personally reviewed following labs and imaging studies  CBC:  Recent Labs Lab 11/20/15 1713 11/23/15 1950  WBC 17.8* 26.9*  NEUTROABS 14.0* 22.6*  HGB 14.0 12.4  HCT 38.9 35.5*  MCV 87.8 89.0  PLT 422* 123456*   Basic Metabolic Panel:  Recent Labs Lab 11/23/15 1950  NA 135  K 3.1*  CL 100*  CO2 24  GLUCOSE 93  BUN 13  CREATININE 0.90  CALCIUM 9.0   GFR: Estimated Creatinine Clearance: 62.9 mL/min (by C-G formula based on SCr of 0.9 mg/dL). Liver Function Tests: No results for input(s): AST, ALT, ALKPHOS, BILITOT, PROT, ALBUMIN in the last 168 hours. No results for input(s): LIPASE, AMYLASE in the last 168 hours. No results for input(s): AMMONIA in the last 168 hours. Coagulation Profile: No results for input(s): INR, PROTIME in the last 168 hours. Cardiac Enzymes: No results for input(s): CKTOTAL, CKMB, CKMBINDEX, TROPONINI in the last 168 hours. BNP (last 3 results) No results for input(s): PROBNP in the last 8760 hours. HbA1C: No results for input(s): HGBA1C in the last 72 hours. CBG: No results for input(s): GLUCAP in the last 168 hours. Lipid Profile: No results for input(s): CHOL, HDL, LDLCALC, TRIG, CHOLHDL, LDLDIRECT in the last 72 hours. Thyroid Function Tests: No results for input(s): TSH, T4TOTAL, FREET4, T3FREE, THYROIDAB in the last 72 hours. Anemia Panel: No results for input(s): VITAMINB12, FOLATE, FERRITIN, TIBC, IRON, RETICCTPCT in the last 72 hours. Urine analysis:    Component Value Date/Time   COLORURINE YELLOW 09/16/2014 0714   APPEARANCEUR CLOUDY (A) 09/16/2014 0714   LABSPEC 1.024 09/16/2014 0714   PHURINE 6.0  09/16/2014 0714   GLUCOSEU NEGATIVE 09/16/2014 0714   HGBUR LARGE (A) 09/16/2014 0714   BILIRUBINUR NEGATIVE 09/16/2014 0714   BILIRUBINUR n 11/30/2012 1256   KETONESUR 40 (A) 09/16/2014 0714   PROTEINUR NEGATIVE 09/16/2014 0714   UROBILINOGEN 0.2 09/16/2014 0714   NITRITE NEGATIVE 09/16/2014 0714   LEUKOCYTESUR NEGATIVE 09/16/2014 0714    Creatinine Clearance: Estimated Creatinine Clearance: 62.9 mL/min (by C-G formula based on SCr of 0.9 mg/dL).  Sepsis Labs: @LABRCNTIP (procalcitonin:4,lacticidven:4) ) Recent Results (from the past 240 hour(s))  Aerobic/Anaerobic Culture (surgical/deep wound)     Status: None (Preliminary result)   Collection Time: 11/20/15  6:53 PM  Result Value Ref Range Status   Specimen Description ABSCESS  Final   Special Requests VULVA LESIONS  Final   Gram Stain   Final    ABUNDANT WBC PRESENT, PREDOMINANTLY PMN RARE GRAM POSITIVE COCCI IN PAIRS Performed at [facility]    Culture   Final    FEW METHICILLIN RESISTANT STAPHYLOCOCCUS AUREUS NO ANAEROBES ISOLATED; CULTURE IN PROGRESS FOR 5 DAYS    Report Status PENDING  Incomplete   Organism ID, Bacteria METHICILLIN RESISTANT STAPHYLOCOCCUS AUREUS  Final      Susceptibility   Methicillin resistant staphylococcus aureus - MIC*    CIPROFLOXACIN >=8 RESISTANT Resistant     ERYTHROMYCIN >=8 RESISTANT Resistant     GENTAMICIN <=0.5 SENSITIVE Sensitive     OXACILLIN >=4 RESISTANT Resistant     TETRACYCLINE <=1 SENSITIVE Sensitive     VANCOMYCIN <=0.5 SENSITIVE Sensitive     TRIMETH/SULFA <=10 SENSITIVE Sensitive     CLINDAMYCIN <=0.25 SENSITIVE Sensitive     RIFAMPIN <=0.5 SENSITIVE Sensitive     Inducible Clindamycin NEGATIVE Sensitive     * FEW METHICILLIN RESISTANT STAPHYLOCOCCUS AUREUS     Radiological Exams on Admission: [place] Chest  Result Date: 11/23/2015 CLINICAL DATA:  Recent abscess drainage from the vulva on 7 05/22/2015. Recent right axillary lump felt for approximately 1 week ago.  This has increased in size. Area is red and hot. EXAM: CHEST ULTRASOUND COMPARISON:  None. FINDINGS: In the inferior aspect of the right axilla, there is a 12 x 6 x 16 mm complex appearing mass or collection. The surrounding fat appears echogenic consistent with inflammation. More superiorly there is an  irregular fluid collection neck contacts the dermis. It measures 2.8 x 1.7 x 2.6 cm. IMPRESSION: 1. There is inflammation in the right axilla. In the superior axilla there is a irregular complex collection measuring 2.8 x 1.7 x 2.6 cm and may connect to the scan. This could reflect an infected inclusion cyst. 2. More inferiorly in the right axilla is in more discrete complex collection versus a possible mass or inflamed lymph node. It measures 12 x 6 x 16 mm. 3. There is increased echogenicity throughout the axillary fat consistent with diffuse inflammation. Electronically Signed   By: Lajean Manes M.D.   On: 11/23/2015 21:30   US Pelvis Limited  Result Date: 11/23/2015 CLINICAL DATA:  Redness and induration of the right lower quadrant anterior abdominal wall. EXAM: US PELVIS LIMITED TECHNIQUE: Ultrasound examination of the pelvic soft tissues was performed in the area of clinical concern. COMPARISON:  None. FINDINGS: Limited sonographic evaluation of the right lower quadrant of the lower abdominal wall demonstrates a subcutaneous irregular hypoechoic mass versus complex fluid collection measuring 3.3 x 2.6 by 3.5. Communication with the skin surface is noted on several of the static images. Mobile debris also noted within this collection. IMPRESSION: Right paramedial lower anterior abdominal wall subcutaneous phlegmon collection with apparent communication to the skin, 3.5 cm in greatest dimension. Electronically Signed   By: Fidela Salisbury M.D.   On: 11/23/2015 21:28    EKG: Not done  Assessment/Plan Principal Problem:   Cellulitis of right axilla Active Problems:   Depression   Abscess of vulva  s/p I&D   Cellulitis of abdominal wall   Obesity (BMI 30-39.9)   Hypokalemia   Leukocytosis   MRSA (methicillin resistant staph aureus) culture positive   Cellulitis -Patient with multiple areas of cellulitis with possible developing abscess.   -Groin/labial abscess already drained and MRSA positive -Presume that this is a diffuse MRSA infection -Will check blood cultures to r/o MRSA bacteremia -Patient may require Echo/TEE -May benefit from ID consultation in AM -For now, will place in Observation status on Med Surg -MRSA prevention/treatment -Zosyn and Vanc, pharmacy to dose -Leukocytosis thought to be related to active MRSA infection which is likely systemic in nature -Recent HIV test negative -Gen Surg is on board, will consider I&D again in AM (NPO in case she needs to go to the OR)  Hypokalemia -Replete -Follow  Depression -Continue Wellbutrin   DVT prophylaxis: Lovenox; ambulation as tolerated but she is in a reasonable amount of pain which may limit her ability to move Code Status: Full - confirmed with patient Family Communication: None present; if needed, contact her daughter, Ebony Stanley, [phone] Disposition Plan: Home once clinically improved Consults called: Gen Surg Admission status: Observation to Med Surg   Karmen Bongo MD [facility]  If 7PM-7AM, please contact night-coverage [email or web address] Password [email or web address]  11/23/2015, 10:12 PM

## 2015-11-23 NOTE — Progress Notes (Signed)
Pharmacy Antibiotic Follow-up Note  Ebony Stanley is a 60 y.o. year-old female admitted on 11/23/2015.  The patient is currently on day 1 of Vancomycin & Zosyn for R axillary abscess.  Assessment/Plan: Vancomycin 1gm x1, then 1gm  IV every 12 hours.  Goal trough 10-15 mcg/mL. Zosyn 3.375g IV q8h (4 hour infusion).  Temp (24hrs), Avg:98.6 F (37 C), Min:98.3 F (36.8 C), Max:98.8 F (37.1 C)   Recent Labs Lab 11/20/15 1713 11/23/15 1950  WBC 17.8* 26.9*    Recent Labs Lab 11/23/15 1950  CREATININE 0.90   Estimated Creatinine Clearance: 62.9 mL/min (by C-G formula based on SCr of 0.9 mg/dL).    Allergies  Allergen Reactions  . Kenalog [Triamcinolone] Anaphylaxis    Was mixed with lidocaine, unsure which caused the reaction   . Lidocaine Anaphylaxis    Was mixed with the Kenalog, unsure which medication caused the reaction Pt repots she received Lidocaine since that time without trouble.   Antimicrobials this admission: 8/3 Vancomycin  >>  8/3 Zosyn >>   Levels/dose changes this admission:  Microbiology results: None ordered  Thank you for allowing pharmacy to be a part of this patient's care.  Minda Ditto PharmD 11/23/2015 9:12 PM

## 2015-11-23 NOTE — ED Notes (Signed)
First blood cultures drawn, pt. Receiving antibiotics at time of lab draw.

## 2015-11-23 NOTE — ED Triage Notes (Signed)
Patient presents for abscess to right axilla and lower abdomen x1 week. Reports subjective fever (highest 102). No drainage, no N/V.

## 2015-11-24 DIAGNOSIS — R112 Nausea with vomiting, unspecified: Secondary | ICD-10-CM

## 2015-11-24 DIAGNOSIS — Z806 Family history of leukemia: Secondary | ICD-10-CM | POA: Diagnosis not present

## 2015-11-24 DIAGNOSIS — L03111 Cellulitis of right axilla: Secondary | ICD-10-CM | POA: Diagnosis present

## 2015-11-24 DIAGNOSIS — J45909 Unspecified asthma, uncomplicated: Secondary | ICD-10-CM | POA: Diagnosis present

## 2015-11-24 DIAGNOSIS — N764 Abscess of vulva: Secondary | ICD-10-CM

## 2015-11-24 DIAGNOSIS — Z6834 Body mass index (BMI) 34.0-34.9, adult: Secondary | ICD-10-CM | POA: Diagnosis not present

## 2015-11-24 DIAGNOSIS — Z888 Allergy status to other drugs, medicaments and biological substances status: Secondary | ICD-10-CM | POA: Diagnosis not present

## 2015-11-24 DIAGNOSIS — Z8614 Personal history of Methicillin resistant Staphylococcus aureus infection: Secondary | ICD-10-CM | POA: Diagnosis not present

## 2015-11-24 DIAGNOSIS — E876 Hypokalemia: Secondary | ICD-10-CM | POA: Diagnosis present

## 2015-11-24 DIAGNOSIS — L02211 Cutaneous abscess of abdominal wall: Secondary | ICD-10-CM

## 2015-11-24 DIAGNOSIS — L02411 Cutaneous abscess of right axilla: Secondary | ICD-10-CM | POA: Diagnosis present

## 2015-11-24 DIAGNOSIS — Z22322 Carrier or suspected carrier of Methicillin resistant Staphylococcus aureus: Secondary | ICD-10-CM | POA: Diagnosis not present

## 2015-11-24 DIAGNOSIS — Z8041 Family history of malignant neoplasm of ovary: Secondary | ICD-10-CM | POA: Diagnosis not present

## 2015-11-24 DIAGNOSIS — L03311 Cellulitis of abdominal wall: Secondary | ICD-10-CM | POA: Diagnosis present

## 2015-11-24 DIAGNOSIS — Z79899 Other long term (current) drug therapy: Secondary | ICD-10-CM | POA: Diagnosis not present

## 2015-11-24 DIAGNOSIS — T50905A Adverse effect of unspecified drugs, medicaments and biological substances, initial encounter: Secondary | ICD-10-CM | POA: Diagnosis not present

## 2015-11-24 DIAGNOSIS — D649 Anemia, unspecified: Secondary | ICD-10-CM | POA: Diagnosis present

## 2015-11-24 DIAGNOSIS — B9562 Methicillin resistant Staphylococcus aureus infection as the cause of diseases classified elsewhere: Secondary | ICD-10-CM | POA: Diagnosis present

## 2015-11-24 DIAGNOSIS — L0291 Cutaneous abscess, unspecified: Secondary | ICD-10-CM | POA: Diagnosis not present

## 2015-11-24 DIAGNOSIS — Z884 Allergy status to anesthetic agent status: Secondary | ICD-10-CM | POA: Diagnosis not present

## 2015-11-24 DIAGNOSIS — L0390 Cellulitis, unspecified: Secondary | ICD-10-CM | POA: Diagnosis present

## 2015-11-24 DIAGNOSIS — A419 Sepsis, unspecified organism: Secondary | ICD-10-CM | POA: Diagnosis present

## 2015-11-24 DIAGNOSIS — F329 Major depressive disorder, single episode, unspecified: Secondary | ICD-10-CM | POA: Diagnosis present

## 2015-11-24 DIAGNOSIS — F419 Anxiety disorder, unspecified: Secondary | ICD-10-CM | POA: Diagnosis present

## 2015-11-24 DIAGNOSIS — E669 Obesity, unspecified: Secondary | ICD-10-CM | POA: Diagnosis present

## 2015-11-24 LAB — CBC
HCT: 33.7 % — ABNORMAL LOW (ref 36.0–46.0)
Hemoglobin: 12 g/dL (ref 12.0–15.0)
MCH: 31.3 pg (ref 26.0–34.0)
MCHC: 35.6 g/dL (ref 30.0–36.0)
MCV: 88 fL (ref 78.0–100.0)
PLATELETS: 498 10*3/uL — AB (ref 150–400)
RBC: 3.83 MIL/uL — AB (ref 3.87–5.11)
RDW: 13.5 % (ref 11.5–15.5)
WBC: 25.5 10*3/uL — ABNORMAL HIGH (ref 4.0–10.5)

## 2015-11-24 LAB — BASIC METABOLIC PANEL
Anion gap: 12 (ref 5–15)
BUN: 11 mg/dL (ref 6–20)
CHLORIDE: 98 mmol/L — AB (ref 101–111)
CO2: 22 mmol/L (ref 22–32)
CREATININE: 0.9 mg/dL (ref 0.44–1.00)
Calcium: 8.5 mg/dL — ABNORMAL LOW (ref 8.9–10.3)
GFR calc non Af Amer: >60 mL/min (ref >60)
GLUCOSE: 111 mg/dL — AB (ref 65–99)
Potassium: 3 mmol/L — ABNORMAL LOW (ref 3.5–5.1)
Sodium: 132 mmol/L — ABNORMAL LOW (ref 135–145)

## 2015-11-24 MED ORDER — CELECOXIB 200 MG PO CAPS
200.0000 mg | ORAL_CAPSULE | Freq: Every day | ORAL | Status: DC
  Administered 2015-11-24 – 2015-12-04 (×11): 200 mg via ORAL
  Filled 2015-11-24 (×11): qty 1

## 2015-11-24 MED ORDER — ONDANSETRON 4 MG PO TBDP
8.0000 mg | ORAL_TABLET | Freq: Three times a day (TID) | ORAL | Status: DC | PRN
  Administered 2015-11-24 – 2015-12-05 (×2): 8 mg via ORAL
  Filled 2015-11-24 (×2): qty 2

## 2015-11-24 MED ORDER — ADULT MULTIVITAMIN W/MINERALS CH
1.0000 | ORAL_TABLET | Freq: Every day | ORAL | Status: DC
  Administered 2015-11-25 – 2015-12-05 (×10): 1 via ORAL
  Filled 2015-11-24 (×11): qty 1

## 2015-11-24 MED ORDER — CLINDAMYCIN PHOSPHATE 600 MG/50ML IV SOLN
600.0000 mg | Freq: Three times a day (TID) | INTRAVENOUS | Status: AC
  Administered 2015-11-24 – 2015-11-25 (×3): 600 mg via INTRAVENOUS
  Filled 2015-11-24 (×3): qty 50

## 2015-11-24 MED ORDER — MAGNESIUM SULFATE 2 GM/50ML IV SOLN
2.0000 g | Freq: Once | INTRAVENOUS | Status: AC
  Administered 2015-11-24: 2 g via INTRAVENOUS
  Filled 2015-11-24: qty 50

## 2015-11-24 MED ORDER — PRO-STAT SUGAR FREE PO LIQD
30.0000 mL | Freq: Every day | ORAL | Status: DC
  Administered 2015-11-27 – 2015-11-30 (×3): 30 mL via ORAL
  Filled 2015-11-24 (×12): qty 30

## 2015-11-24 MED ORDER — ESTRADIOL 1 MG PO TABS
1.0000 mg | ORAL_TABLET | Freq: Every day | ORAL | Status: DC
  Administered 2015-11-24 – 2015-12-04 (×11): 1 mg via ORAL
  Filled 2015-11-24 (×12): qty 1

## 2015-11-24 MED ORDER — POTASSIUM CHLORIDE CRYS ER 20 MEQ PO TBCR
40.0000 meq | EXTENDED_RELEASE_TABLET | Freq: Four times a day (QID) | ORAL | Status: AC
  Administered 2015-11-24: 40 meq via ORAL
  Filled 2015-11-24 (×2): qty 2

## 2015-11-24 MED ORDER — ENSURE ENLIVE PO LIQD
237.0000 mL | ORAL | Status: DC
  Administered 2015-11-29 – 2015-12-01 (×2): 237 mL via ORAL

## 2015-11-24 MED ORDER — PROGESTERONE MICRONIZED 100 MG PO CAPS
100.0000 mg | ORAL_CAPSULE | Freq: Every day | ORAL | Status: DC
  Administered 2015-11-24 – 2015-12-04 (×11): 100 mg via ORAL
  Filled 2015-11-24 (×13): qty 1

## 2015-11-24 MED ORDER — CELECOXIB 200 MG PO CAPS: 200.0000 mg | ORAL_CAPSULE | Freq: Every evening | ORAL | Status: DC

## 2015-11-24 NOTE — Consult Note (Addendum)
[facility]  Total days of antibiotics 2        Day 2 piptazo        Day 2 vanco              Reason for Consult: cellulitis    Referring Physician: elmahi  Principal Problem:   Cellulitis of right axilla Active Problems:   Depression   Abscess of vulva s/p I&D   Cellulitis of abdominal wall   Obesity (BMI 30-39.9)   Hypokalemia   Leukocytosis   MRSA (methicillin resistant staph aureus) culture positive   Cellulitis    HPI: Chelsei Mcchesney is a 60 y.o. female with history of obesity, depression, asthma, had recently started to have abscess formation under each armpit (thought to be due to shaving) and labial area. She first saw Dr Philis Pique on 7/27 but lesions were small at that time and did not warrant I x D. The areas continued to get worse both in degree of pain and redness/firmness.  Attempt was made at her OB office for I x D of vulvar absess but did not tolerate and thus sent to [facility] for  I x D of vulvar abscess on 7/31. At that time had fever of 101-102F, wbc of 17K. In the OR, they were able to drain 3 abscess ( 1 4cm on right labia, and 2 abscesses on left labia). Interestingly, she had episode of anaphylaxis with triamcinolone/lidocaine in March, but tolerated marcaine when used during I x D without anaphylaxis. OR cultures grew MRSA ( S clinda, S sulfa, S tetra, S vanco, R oxacillin) she was discharged on bactrim and sitz bath. She returned for follow up, but noticed that her axillary and lower abdominal wall abscess continued to progress with associated fevers of 101. Thus, she was referred to the ED for evaluation for I x D, IV Abtx on 8/3. In the ED, she was seen by general surgery, at that time Dr. Johney Maine did not feel that she would warrant I x D as of yet. Recommend to continue with IV abtx. She was started on vancomycin and piptazo. Her labs were impressive for WBC of 26.9K with 84N. Since starting abtx, erythema to right axilla has improved, more  focal roughly 80% of previous marking. Left axilla very minimally impact, appears that it had started to drain. Erythema to lower abdomen, not significantly changed. Recommendations to keep following response to abtx, no I x D at the present, consider MRSA decolonization. She does not smoke nor is a diabetic  She reports the right axilla and lower abdomen being exquisitely tender. She states that vulvar lesions are non tender.  She denies having recurrent skin infection. She recalls that it high school, she had vulvalar lesions requiring to be expressed in medical setting.  Imaging showed of right axilla showed : superior axilla there is a irregular complex collection measuring 2.8 x 1.7 x 2.6 cm and may connect to the scan. This could reflect an infected inclusion cyst. 2. More inferiorly in the right axilla is in more discrete complex collection versus a possible mass or inflamed lymph node. It measures 12 x 6 x 16 mm  Lower abd wall:  lower abdominal wall demonstrates a subcutaneous irregular hypoechoic mass versus complex fluid collection measuring 3.3 x 2.6 by 3.5.  Past Medical History:  Diagnosis Date  . Abscess of vulva 11/23/2015  . Anaphylactic reaction   . Anxiety   . Arthritis   . Asthma  when pregnant  . Complication of anesthesia    unknown if Lidocaine was the cause of the anaphylaxis  . Constipation   . Depression   . Heart murmur    as child  . Personal history of colonic adenomas 06/12/2012   history in 2011 with 2 adenomas Ferdinand Lango) 06/15/2012 cecal polyp 7 mm adenoma      Allergies:  Allergies  Allergen Reactions  . Kenalog [Triamcinolone] Anaphylaxis    Was mixed with lidocaine, unsure which caused the reaction   . Lidocaine Anaphylaxis    Was mixed with the Kenalog, unsure which medication caused the reaction Pt repots she received Lidocaine since that time without trouble.    MEDICATIONS: . acetaminophen  1,000 mg Oral TID  . buPROPion  150 mg Oral Daily    . celecoxib  200 mg Oral QHS  . Chlorhexidine Gluconate Cloth  6 each Topical Q0600  . enoxaparin (LOVENOX) injection  40 mg Subcutaneous Q24H  . estradiol  1 mg Oral QHS  . feeding supplement (ENSURE ENLIVE)  237 mL Oral BID BM  . lip balm  1 application Topical BID  . magnesium sulfate 1 - 4 g bolus IVPB  2 g Intravenous Once  . mupirocin ointment  1 application Nasal BID  . piperacillin-tazobactam (ZOSYN)  IV  3.375 g Intravenous Q8H  . potassium chloride  40 mEq Oral Q6H  . progesterone  100 mg Oral QHS  . senna  1 tablet Oral BID  . vancomycin  1,000 mg Intravenous Q12H  . vitamin C  500 mg Oral BID    Social History  Substance Use Topics  . Smoking status: Never Smoker  . Smokeless tobacco: Never Used  . Alcohol use Yes     Comment: very rare    Family History  Problem Relation Age of Onset  . Ovarian cancer Mother 74  . Hyperlipidemia Mother   . Alcohol abuse Father   . Hyperlipidemia Father   . Leukemia Paternal Grandmother   . Bipolar disorder Daughter   . Colon cancer Neg Hx      Review of Systems  Constitutional: positivefor fever, chills, but negative diaphoresis, activity change, appetite change, fatigue and unexpected weight change.  HENT: Negative for congestion, sore throat, rhinorrhea, sneezing, trouble swallowing and sinus pressure.  Eyes: Negative for photophobia and visual disturbance.  Respiratory: Negative for cough, chest tightness, shortness of breath, wheezing and stridor.  Cardiovascular: Negative for chest pain, palpitations and leg swelling.  Gastrointestinal: postive for nausea, vomiting, abdominal pain, diarrhea, constipation, blood in stool, abdominal distention and anal bleeding.  Genitourinary: Negative for dysuria, hematuria, flank pain and difficulty urinating.  Musculoskeletal: Negative for myalgias, back pain, joint swelling, arthralgias and gait problem.  Skin: painful rash/absess underneath right arms, lower abdomen, pubic region.  Negative for color change, pallor, rash and wound.  Neurological: Negative for dizziness, tremors, weakness and light-headedness.  Hematological: Negative for adenopathy. Does not bruise/bleed easily.  Psychiatric/Behavioral: Negative for behavioral problems, confusion, sleep disturbance, dysphoric mood, decreased concentration and agitation.    OBJECTIVE: Temp:  [98.3 F (36.8 C)-99.6 F (37.6 C)] 99.6 F (37.6 C) (08/04 0900) Pulse Rate:  [89-115] 89 (08/04 0518) Resp:  [20] 20 (08/04 0518) BP: (93-112)/(49-72) 93/49 (08/04 0518) SpO2:  [96 %-100 %] 96 % (08/04 0518) Weight:  [179 lb (81.2 kg)] 179 lb (81.2 kg) (08/03 2245) Physical Exam  Constitutional:  oriented to person, place, and time. appears well-developed and well-nourished. No distress.  HENT: [place]/AT, PERRLA, no  scleral icterus Mouth/Throat: Oropharynx is clear and moist. No oropharyngeal exudate.  Cardiovascular: Normal rate, regular rhythm and normal heart sounds. Exam reveals no gallop and no friction rub.  No murmur heard.  Pulmonary/Chest: Effort normal and breath sounds normal. No respiratory distress.  has no wheezes.  Neck = supple, no nuchal rigidity Abdominal: Soft. Bowel sounds are normal.  exhibits no distension. Large erythematous and indurated area above mons pubis. No significant regression from outline Lymphadenopathy: no cervical adenopathy.  Axilla = significant induration in right axilla with surrounding erythema roughly 18cm wide. Area appears somewhat improved since erythema is within the outline. Left axilla has small pinpoint opening with surround erythema 2cm long, not draining, not tender Neurological: alert and oriented to person, place, and time.  Skin: Skin is warm and dry. No rash noted. No erythema.  Psychiatric: a normal mood and affect.  behavior is normal.     LABS: Results for orders placed or performed during the hospital encounter of 11/23/15 (from the past 48 hour(s))  Basic metabolic  panel     Status: Abnormal   Collection Time: 11/23/15  7:50 PM  Result Value Ref Range   Sodium 135 135 - 145 mmol/L   Potassium 3.1 (L) 3.5 - 5.1 mmol/L   Chloride 100 (L) 101 - 111 mmol/L   CO2 24 22 - 32 mmol/L   Glucose, Bld 93 65 - 99 mg/dL   BUN 13 6 - 20 mg/dL   Creatinine, Ser 0.90 0.44 - 1.00 mg/dL   Calcium 9.0 8.9 - 10.3 mg/dL   GFR calc non Af Amer >60 >60 mL/min   GFR calc Af Amer >60 >60 mL/min    Comment: (NOTE) The eGFR has been calculated using the CKD EPI equation. This calculation has not been validated in all clinical situations. eGFR's persistently <60 mL/min signify possible Chronic Kidney Disease.    Anion gap 11 5 - 15  CBC with Differential     Status: Abnormal   Collection Time: 11/23/15  7:50 PM  Result Value Ref Range   WBC 26.9 (H) 4.0 - 10.5 K/uL   RBC 3.99 3.87 - 5.11 MIL/uL   Hemoglobin 12.4 12.0 - 15.0 g/dL   HCT 35.5 (L) 36.0 - 46.0 %   MCV 89.0 78.0 - 100.0 fL   MCH 31.1 26.0 - 34.0 pg   MCHC 34.9 30.0 - 36.0 g/dL   RDW 13.5 11.5 - 15.5 %   Platelets 461 (H) 150 - 400 K/uL   Neutrophils Relative % 84 %   Neutro Abs 22.6 (H) 1.7 - 7.7 K/uL   Lymphocytes Relative 9 %   Lymphs Abs 2.4 0.7 - 4.0 K/uL   Monocytes Relative 7 %   Monocytes Absolute 1.7 (H) 0.1 - 1.0 K/uL   Eosinophils Relative 1 %   Eosinophils Absolute 0.1 0.0 - 0.7 K/uL   Basophils Relative 0 %   Basophils Absolute 0.0 0.0 - 0.1 K/uL   WBC Morphology VACUOLATED NEUTROPHILS   Basic metabolic panel     Status: Abnormal   Collection Time: 11/24/15  2:19 AM  Result Value Ref Range   Sodium 132 (L) 135 - 145 mmol/L   Potassium 3.0 (L) 3.5 - 5.1 mmol/L   Chloride 98 (L) 101 - 111 mmol/L   CO2 22 22 - 32 mmol/L   Glucose, Bld 111 (H) 65 - 99 mg/dL   BUN 11 6 - 20 mg/dL   Creatinine, Ser 0.90 0.44 - 1.00 mg/dL   Calcium  8.5 (L) 8.9 - 10.3 mg/dL   GFR calc non Af Amer >60 >60 mL/min   GFR calc Af Amer >60 >60 mL/min    Comment: (NOTE) The eGFR has been calculated using  the CKD EPI equation. This calculation has not been validated in all clinical situations. eGFR's persistently <60 mL/min signify possible Chronic Kidney Disease.    Anion gap 12 5 - 15  CBC     Status: Abnormal   Collection Time: 11/24/15  2:19 AM  Result Value Ref Range   WBC 25.5 (H) 4.0 - 10.5 K/uL   RBC 3.83 (L) 3.87 - 5.11 MIL/uL   Hemoglobin 12.0 12.0 - 15.0 g/dL   HCT 33.7 (L) 36.0 - 46.0 %   MCV 88.0 78.0 - 100.0 fL   MCH 31.3 26.0 - 34.0 pg   MCHC 35.6 30.0 - 36.0 g/dL   RDW 13.5 11.5 - 15.5 %   Platelets 498 (H) 150 - 400 K/uL    MICRO: 8/4 blood cx ngtd 7/31 wound cx MRSA (S tetra S sulfa S vanco S clinda) IMAGING: [place] Chest  Result Date: 11/23/2015 CLINICAL DATA:  Recent abscess drainage from the vulva on 7 05/22/2015. Recent right axillary lump felt for approximately 1 week ago. This has increased in size. Area is red and hot. EXAM: CHEST ULTRASOUND COMPARISON:  None. FINDINGS: In the inferior aspect of the right axilla, there is a 12 x 6 x 16 mm complex appearing mass or collection. The surrounding fat appears echogenic consistent with inflammation. More superiorly there is an irregular fluid collection neck contacts the dermis. It measures 2.8 x 1.7 x 2.6 cm. IMPRESSION: 1. There is inflammation in the right axilla. In the superior axilla there is a irregular complex collection measuring 2.8 x 1.7 x 2.6 cm and may connect to the scan. This could reflect an infected inclusion cyst. 2. More inferiorly in the right axilla is in more discrete complex collection versus a possible mass or inflamed lymph node. It measures 12 x 6 x 16 mm. 3. There is increased echogenicity throughout the axillary fat consistent with diffuse inflammation. Electronically Signed   By: Lajean Manes M.D.   On: 11/23/2015 21:30   US Pelvis Limited  Result Date: 11/23/2015 CLINICAL DATA:  Redness and induration of the right lower quadrant anterior abdominal wall. EXAM: US PELVIS LIMITED TECHNIQUE:  Ultrasound examination of the pelvic soft tissues was performed in the area of clinical concern. COMPARISON:  None. FINDINGS: Limited sonographic evaluation of the right lower quadrant of the lower abdominal wall demonstrates a subcutaneous irregular hypoechoic mass versus complex fluid collection measuring 3.3 x 2.6 by 3.5. Communication with the skin surface is noted on several of the static images. Mobile debris also noted within this collection. IMPRESSION: Right paramedial lower anterior abdominal wall subcutaneous phlegmon collection with apparent communication to the skin, 3.5 cm in greatest dimension. Electronically Signed   By: Fidela Salisbury M.D.   On: 11/23/2015 21:28   Assessment/Plan:  60yo F with multiple skin abscess c/w MRSA skin infection involving axilla, lower abdominal wall and vulvar lesions. Vulvar lesions recently I x D'd on 7/31 CX+ MRSA. She was started on bactrim that did little to improve axillary and abdominal wall lesions, possibly due to size of area of involvement. She was admitted for IV abtx and eval for I x D. Skin appears to be responding to IV abtx, not organized to need I x D. Clinical appearance not suggestive of hydradinitis. Axillary lesions can  be explained by shaving history though unclear how she may have had abdominal wall lesions occur at the same time.  - will add 1 day of IV clindamycin to vancomycin to see if that can help in accelerating response to treatment - agree with primary team that she would be a good candidate for MRSA decolonization, which would include 10d of mupirocin intranasal BID, and daily chlorhexadine wash/wipes - continue to check CBC to see that leukocytosis is improving - if she continues to have good response to iv therapy -> can switch back to bactrim or doxycycline and treat for addn 10 days. Both are sensitive. - defer to surgery if she needs I x D. 2 small areas in axilla look like they maybe coming to a head though still quite  indurated. - continue to follow blood cx to see if she may need, further work up for MRSA infection  Nausea with vomiting = possibly side effect with pain meds. Will add zofran odt prn  - health maintenance = will check hep c and hiv ab.  Dr Megan Salon available for question -will return to see her on Monday

## 2015-11-24 NOTE — Progress Notes (Signed)
PROGRESS NOTE  Ebony Stanley  [id number] DOB: 1955-06-28 DOA: 11/23/2015 PCP: Robyn Haber, MD Outpatient Specialists:  Subjective: Feels okay much better than yesterday.  Brief Narrative:  60 year old with past medical history of obesity, MRSA cellulitis came in with recurrent valvular abscess, right axillary abscess and suprapubic, ID to evaluate.  Assessment & Plan:   Principal Problem:   Cellulitis of right axilla Active Problems:   Depression   Abscess of vulva s/p I&D   Cellulitis of abdominal wall   Obesity (BMI 30-39.9)   Hypokalemia   Leukocytosis   MRSA (methicillin resistant staph aureus) culture positive   Cellulitis -Patient with multiple areas of cellulitis (under right arm and suprapubic area) with possible developing abscess.   -Groin/labial abscess already drained and MRSA positive -Presume that this is a diffuse MRSA infection (HIV negative) -Blood cultures obtained, cannot rule out transient bacteremia as cause of the multiple cellulitis. -Failed outpatient oral antibiotics, started on Zosyn and vancomycin. -ID to evaluate ? Hydradenitis suppurativa. Discussed with Dr. Johney Maine, does not have drainable abscess for now.  Abscess of the vulva -Has 3 different abscesses in her vulva, this was seated and drained by OB/GYN. -This is a grew MRSA.  Hypokalemia -Replete with oral supplements -Has history of hypomagnesemia, replete with IV supplements check magnesium in a.m.  Depression -Continue Wellbutrin   DVT prophylaxis: Subcutaneous heparin Code Status: Full Code Family Communication:  Disposition Plan:  Diet: Diet regular Room service appropriate? Yes; Fluid consistency: Thin Diet NPO time specified Except for: Sips with Meds, Ice Chips  Consultants:   Gen. surgery.  Infectious disease.  Procedures:   None  Antimicrobials:   Zosyn and vancomycin  Objective: Vitals:   11/23/15 2237 11/23/15 2245 11/24/15 0518 11/24/15 0900    BP: 110/72 105/62 (!) 93/49   Pulse: 107 (!) 103 89   Resp:  20 20   Temp:  99 F (37.2 C) 98.8 F (37.1 C) 99.6 F (37.6 C)  TempSrc:  Oral Oral   SpO2: 100% 99% 96%   Weight:  81.2 kg (179 lb)    Height:  5' (1.524 m)      Intake/Output Summary (Last 24 hours) at 11/24/15 1324 Last data filed at 11/24/15 0626  Gross per 24 hour  Intake           606.25 ml  Output              575 ml  Net            31.25 ml   Filed Weights   11/23/15 2245  Weight: 81.2 kg (179 lb)    Examination: General exam: Appears calm and comfortable  Respiratory system: Clear to auscultation. Respiratory effort normal. Cardiovascular system: S1 & S2 heard, RRR. No JVD, murmurs, rubs, gallops or clicks. No pedal edema. Gastrointestinal system: Abdomen is nondistended, soft and nontender. No organomegaly or masses felt. Normal bowel sounds heard. Central nervous system: Alert and oriented. No focal neurological deficits. Extremities: Symmetric 5 x 5 power. Skin: No rashes, lesions or ulcers Psychiatry: Judgement and insight appear normal. Mood & affect appropriate.   Data Reviewed: I have personally reviewed following labs and imaging studies  CBC:  Recent Labs Lab 11/20/15 1713 11/23/15 1950 11/24/15 0219  WBC 17.8* 26.9* 25.5*  NEUTROABS 14.0* 22.6*  --   HGB 14.0 12.4 12.0  HCT 38.9 35.5* 33.7*  MCV 87.8 89.0 88.0  PLT 422* 461* [id number]*   Basic Metabolic Panel:  Recent Labs Lab 11/23/15  1950 11/24/15 0219  NA 135 132*  K 3.1* 3.0*  CL 100* 98*  CO2 24 22  GLUCOSE 93 111*  BUN 13 11  CREATININE 0.90 0.90  CALCIUM 9.0 8.5*   GFR: Estimated Creatinine Clearance: 63.5 mL/min (by C-G formula based on SCr of 0.9 mg/dL). Liver Function Tests: No results for input(s): AST, ALT, ALKPHOS, BILITOT, PROT, ALBUMIN in the last 168 hours. No results for input(s): LIPASE, AMYLASE in the last 168 hours. No results for input(s): AMMONIA in the last 168 hours. Coagulation Profile: No  results for input(s): INR, PROTIME in the last 168 hours. Cardiac Enzymes: No results for input(s): CKTOTAL, CKMB, CKMBINDEX, TROPONINI in the last 168 hours. BNP (last 3 results) No results for input(s): PROBNP in the last 8760 hours. HbA1C: No results for input(s): HGBA1C in the last 72 hours. CBG: No results for input(s): GLUCAP in the last 168 hours. Lipid Profile: No results for input(s): CHOL, HDL, LDLCALC, TRIG, CHOLHDL, LDLDIRECT in the last 72 hours. Thyroid Function Tests: No results for input(s): TSH, T4TOTAL, FREET4, T3FREE, THYROIDAB in the last 72 hours. Anemia Panel: No results for input(s): VITAMINB12, FOLATE, FERRITIN, TIBC, IRON, RETICCTPCT in the last 72 hours. Urine analysis:    Component Value Date/Time   COLORURINE YELLOW 09/16/2014 0714   APPEARANCEUR CLOUDY (A) 09/16/2014 0714   LABSPEC 1.024 09/16/2014 0714   PHURINE 6.0 09/16/2014 0714   GLUCOSEU NEGATIVE 09/16/2014 0714   HGBUR LARGE (A) 09/16/2014 0714   BILIRUBINUR NEGATIVE 09/16/2014 0714   BILIRUBINUR n 11/30/2012 1256   KETONESUR 40 (A) 09/16/2014 0714   PROTEINUR NEGATIVE 09/16/2014 0714   UROBILINOGEN 0.2 09/16/2014 0714   NITRITE NEGATIVE 09/16/2014 0714   LEUKOCYTESUR NEGATIVE 09/16/2014 0714   Sepsis Labs: @LABRCNTIP (procalcitonin:4,lacticidven:4)  ) Recent Results (from the past 240 hour(s))  Aerobic/Anaerobic Culture (surgical/deep wound)     Status: None (Preliminary result)   Collection Time: 11/20/15  6:53 PM  Result Value Ref Range Status   Specimen Description ABSCESS  Final   Special Requests VULVA LESIONS  Final   Gram Stain   Final    ABUNDANT WBC PRESENT, PREDOMINANTLY PMN RARE GRAM POSITIVE COCCI IN PAIRS Performed at [facility]    Culture   Final    FEW METHICILLIN RESISTANT STAPHYLOCOCCUS AUREUS NO ANAEROBES ISOLATED; CULTURE IN PROGRESS FOR 5 DAYS    Report Status PENDING  Incomplete   Organism ID, Bacteria METHICILLIN RESISTANT STAPHYLOCOCCUS AUREUS   Final      Susceptibility   Methicillin resistant staphylococcus aureus - MIC*    CIPROFLOXACIN >=8 RESISTANT Resistant     ERYTHROMYCIN >=8 RESISTANT Resistant     GENTAMICIN <=0.5 SENSITIVE Sensitive     OXACILLIN >=4 RESISTANT Resistant     TETRACYCLINE <=1 SENSITIVE Sensitive     VANCOMYCIN <=0.5 SENSITIVE Sensitive     TRIMETH/SULFA <=10 SENSITIVE Sensitive     CLINDAMYCIN <=0.25 SENSITIVE Sensitive     RIFAMPIN <=0.5 SENSITIVE Sensitive     Inducible Clindamycin NEGATIVE Sensitive     * FEW METHICILLIN RESISTANT STAPHYLOCOCCUS AUREUS     Invalid input(s): PROCALCITONIN, LACTICACIDVEN   Radiology Studies: [place] Chest  Result Date: 11/23/2015 CLINICAL DATA:  Recent abscess drainage from the vulva on 7 05/22/2015. Recent right axillary lump felt for approximately 1 week ago. This has increased in size. Area is red and hot. EXAM: CHEST ULTRASOUND COMPARISON:  None. FINDINGS: In the inferior aspect of the right axilla, there is a 12 x 6 x 16 mm complex  appearing mass or collection. The surrounding fat appears echogenic consistent with inflammation. More superiorly there is an irregular fluid collection neck contacts the dermis. It measures 2.8 x 1.7 x 2.6 cm. IMPRESSION: 1. There is inflammation in the right axilla. In the superior axilla there is a irregular complex collection measuring 2.8 x 1.7 x 2.6 cm and may connect to the scan. This could reflect an infected inclusion cyst. 2. More inferiorly in the right axilla is in more discrete complex collection versus a possible mass or inflamed lymph node. It measures 12 x 6 x 16 mm. 3. There is increased echogenicity throughout the axillary fat consistent with diffuse inflammation. Electronically Signed   By: Lajean Manes M.D.   On: 11/23/2015 21:30   US Pelvis Limited  Result Date: 11/23/2015 CLINICAL DATA:  Redness and induration of the right lower quadrant anterior abdominal wall. EXAM: US PELVIS LIMITED TECHNIQUE: Ultrasound examination of  the pelvic soft tissues was performed in the area of clinical concern. COMPARISON:  None. FINDINGS: Limited sonographic evaluation of the right lower quadrant of the lower abdominal wall demonstrates a subcutaneous irregular hypoechoic mass versus complex fluid collection measuring 3.3 x 2.6 by 3.5. Communication with the skin surface is noted on several of the static images. Mobile debris also noted within this collection. IMPRESSION: Right paramedial lower anterior abdominal wall subcutaneous phlegmon collection with apparent communication to the skin, 3.5 cm in greatest dimension. Electronically Signed   By: Fidela Salisbury M.D.   On: 11/23/2015 21:28        Scheduled Meds: . acetaminophen  1,000 mg Oral TID  . buPROPion  150 mg Oral Daily  . celecoxib  200 mg Oral QHS  . Chlorhexidine Gluconate Cloth  6 each Topical Q0600  . enoxaparin (LOVENOX) injection  40 mg Subcutaneous Q24H  . estradiol  1 mg Oral QHS  . feeding supplement (ENSURE ENLIVE)  237 mL Oral BID BM  . lip balm  1 application Topical BID  . mupirocin ointment  1 application Nasal BID  . piperacillin-tazobactam (ZOSYN)  IV  3.375 g Intravenous Q8H  . progesterone  100 mg Oral QHS  . senna  1 tablet Oral BID  . vancomycin  1,000 mg Intravenous Q12H  . vitamin C  500 mg Oral BID   Continuous Infusions: . lactated ringers 75 mL/hr at 11/24/15 0011     LOS: 0 days    Time spent: 35 minutes    Tyashia Morrisette A, MD [facility] Pager [phone]  If 7PM-7AM, please contact night-coverage [email or web address] Password [email or web address] 11/24/2015, 1:24 PM

## 2015-11-24 NOTE — Progress Notes (Signed)
[facility]  [address] [place], [place] [address] Phone: [phone] FAX: [phone]   Ebony Stanley [id number] 09/05/1955  CARE TEAM:  PCP: Robyn Haber, MD  Outpatient Care Team: Patient Care Team: Robyn Haber, MD as PCP - General (Family Medicine) Bobbye Charleston, MD as Consulting Physician (Obstetrics and Gynecology)  Inpatient Treatment Team: Treatment Team: Attending Provider: Verlee Monte, MD; Consulting Physician: Nolon Nations, MD; Rounding Team: Ian Bushman, MD; Registered Nurse: Martyn Malay, RN; Technician: Virgia Land, NT  Problem List:   Principal Problem:   Cellulitis of right axilla Active Problems:   Depression   Abscess of vulva s/p I&D   Cellulitis of abdominal wall   Obesity (BMI 30-39.9)   Hypokalemia   Leukocytosis   MRSA (methicillin resistant staph aureus) culture positive           Assessment  Cellulitis of right axilla and lower abdomen.  Some improvement on IV antibiotics  Plan:  Continue IV antibiotics.  We will follow peripherally.  Suspect erythema will localize and become more focal where incision and drainage may be of benefit.  Hold off for now and see if can resolve nonoperatively.  MRSA decolonization.  May benefit from ID consultation given the fact that she has significant MRSA infection despite being otherwise seemingly healthy.  She progressed despite being on oral antibiotics.  Therefore may need IV antibiotics.  Discussed with internal medicine.  Defer care of vulvar abscess to operating gynecologist.  Less pain and erythema down there.  IV antibiotics should help that resolve as well.  -VTE prophylaxis- SCDs, etc  -mobilize as tolerated to help recovery  [name], M.D., F.A.C.S. Gastrointestinal and Minimally Invasive Surgery [facility] [address], [place], [place] [address] [phone] Main /  Paging   11/24/2015  Subjective:  Less pain.  Some itching question erythema from vancomycin dose.  Objective:  Vital signs:  Vitals:   11/23/15 1800 11/23/15 2237 11/23/15 2245 11/24/15 0518  BP: 104/62 110/72 105/62 (!) 93/49  Pulse: 109 107 (!) 103 89  Resp: _0 Temp: 98.3 F (36.8 C)  99 F (37.2 C) 98.8 F (37.1 C)  TempSrc: Oral  Oral Oral  SpO2: 96% 100% 99% 96%  Weight:   81.2 kg (179 lb)   Height:   5' (1.524 m)     Last BM Date: 11/19/15  Intake/Output   Yesterday:  08/03 0701 - 08/04 0700 In: 606.3 [P.O.:120; I.V.:236.3; IV Piggyback:250] Out: 575 [Urine:575] This shift:  No intake/output data recorded.  Bowel function:  Flatus: YES  BM:  No  Drain: (No drain)   Physical Exam:  General: Pt awake/alert/oriented x4 in No acute distress Eyes: PERRL, normal EOM.  Sclera clear.  No icterus Neuro: CN II-XII intact w/o focal sensory/motor deficits. Lymph: No head/neck/groin lymphadenopathy Psych:  No delerium/psychosis/paranoia HENT: Normocephalic, Mucus membranes moist.  No thrush Neck: Supple, No tracheal deviation Chest: No chest wall pain w good excursion CV:  Pulses intact.  Regular rhythm MS: Normal AROM mjr joints.  No obvious deformity Abdomen: Soft.  Nondistended.  .  No evidence of peritonitis.  No incarcerated hernias. Gen:  No active bleeding/discharge.  No edema. Ext:  SCDs BLE.  No mjr edema.  No cyanosis   Skin: Still with significant erythema and right axilla and lateral breast.  However shrinking down.  80% of last night.  A little more focal in the central axilla.  Erythema in infraumbilical region 85% of what it was last night.  No focal erythema or fluctuance No petechiae / purpura  Results:   Labs: Results for orders placed or performed during the hospital encounter of 11/23/15 (from the past 48 hour(s))  Basic metabolic panel     Status: Abnormal   Collection Time: 11/23/15  7:50 PM  Result Value Ref Range    Sodium 135 135 - 145 mmol/L   Potassium 3.1 (L) 3.5 - 5.1 mmol/L   Chloride 100 (L) 101 - 111 mmol/L   CO2 24 22 - 32 mmol/L   Glucose, Bld 93 65 - 99 mg/dL   BUN 13 6 - 20 mg/dL   Creatinine, Ser 0.90 0.44 - 1.00 mg/dL   Calcium 9.0 8.9 - 10.3 mg/dL   GFR calc non Af Amer >60 >60 mL/min   GFR calc Af Amer >60 >60 mL/min    Comment: (NOTE) The eGFR has been calculated using the CKD EPI equation. This calculation has not been validated in all clinical situations. eGFR's persistently <60 mL/min signify possible Chronic Kidney Disease.    Anion gap 11 5 - 15  CBC with Differential     Status: Abnormal   Collection Time: 11/23/15  7:50 PM  Result Value Ref Range   WBC 26.9 (H) 4.0 - 10.5 K/uL   RBC 3.99 3.87 - 5.11 MIL/uL   Hemoglobin 12.4 12.0 - 15.0 g/dL   HCT 35.5 (L) 36.0 - 46.0 %   MCV 89.0 78.0 - 100.0 fL   MCH 31.1 26.0 - 34.0 pg   MCHC 34.9 30.0 - 36.0 g/dL   RDW 13.5 11.5 - 15.5 %   Platelets 461 (H) 150 - 400 K/uL   Neutrophils Relative % 84 %   Neutro Abs 22.6 (H) 1.7 - 7.7 K/uL   Lymphocytes Relative 9 %   Lymphs Abs 2.4 0.7 - 4.0 K/uL   Monocytes Relative 7 %   Monocytes Absolute 1.7 (H) 0.1 - 1.0 K/uL   Eosinophils Relative 1 %   Eosinophils Absolute 0.1 0.0 - 0.7 K/uL   Basophils Relative 0 %   Basophils Absolute 0.0 0.0 - 0.1 K/uL   WBC Morphology VACUOLATED NEUTROPHILS   Basic metabolic panel     Status: Abnormal   Collection Time: 11/24/15  2:19 AM  Result Value Ref Range   Sodium 132 (L) 135 - 145 mmol/L   Potassium 3.0 (L) 3.5 - 5.1 mmol/L   Chloride 98 (L) 101 - 111 mmol/L   CO2 22 22 - 32 mmol/L   Glucose, Bld 111 (H) 65 - 99 mg/dL   BUN 11 6 - 20 mg/dL   Creatinine, Ser 0.90 0.44 - 1.00 mg/dL   Calcium 8.5 (L) 8.9 - 10.3 mg/dL   GFR calc non Af Amer >60 >60 mL/min   GFR calc Af Amer >60 >60 mL/min    Comment: (NOTE) The eGFR has been calculated using the CKD EPI equation. This calculation has not been validated in all clinical  situations. eGFR's persistently <60 mL/min signify possible Chronic Kidney Disease.    Anion gap 12 5 - 15  CBC     Status: Abnormal   Collection Time: 11/24/15  2:19 AM  Result Value Ref Range   WBC 25.5 (H) 4.0 - 10.5 K/uL   RBC 3.83 (L) 3.87 - 5.11 MIL/uL   Hemoglobin 12.0 12.0 - 15.0 g/dL   HCT 33.7 (L) 36.0 - 46.0 %   MCV 88.0 78.0 - 100.0  fL   MCH 31.3 26.0 - 34.0 pg   MCHC 35.6 30.0 - 36.0 g/dL   RDW 13.5 11.5 - 15.5 %   Platelets 498 (H) 150 - 400 K/uL    Imaging / Studies: [place] Chest  Result Date: 11/23/2015 CLINICAL DATA:  Recent abscess drainage from the vulva on 7 05/22/2015. Recent right axillary lump felt for approximately 1 week ago. This has increased in size. Area is red and hot. EXAM: CHEST ULTRASOUND COMPARISON:  None. FINDINGS: In the inferior aspect of the right axilla, there is a 12 x 6 x 16 mm complex appearing mass or collection. The surrounding fat appears echogenic consistent with inflammation. More superiorly there is an irregular fluid collection neck contacts the dermis. It measures 2.8 x 1.7 x 2.6 cm. IMPRESSION: 1. There is inflammation in the right axilla. In the superior axilla there is a irregular complex collection measuring 2.8 x 1.7 x 2.6 cm and may connect to the scan. This could reflect an infected inclusion cyst. 2. More inferiorly in the right axilla is in more discrete complex collection versus a possible mass or inflamed lymph node. It measures 12 x 6 x 16 mm. 3. There is increased echogenicity throughout the axillary fat consistent with diffuse inflammation. Electronically Signed   By: Lajean Manes M.D.   On: 11/23/2015 21:30   US Pelvis Limited  Result Date: 11/23/2015 CLINICAL DATA:  Redness and induration of the right lower quadrant anterior abdominal wall. EXAM: US PELVIS LIMITED TECHNIQUE: Ultrasound examination of the pelvic soft tissues was performed in the area of clinical concern. COMPARISON:  None. FINDINGS: Limited sonographic evaluation  of the right lower quadrant of the lower abdominal wall demonstrates a subcutaneous irregular hypoechoic mass versus complex fluid collection measuring 3.3 x 2.6 by 3.5. Communication with the skin surface is noted on several of the static images. Mobile debris also noted within this collection. IMPRESSION: Right paramedial lower anterior abdominal wall subcutaneous phlegmon collection with apparent communication to the skin, 3.5 cm in greatest dimension. Electronically Signed   By: Fidela Salisbury M.D.   On: 11/23/2015 21:28    Medications / Allergies: per chart  Antibiotics: Anti-infectives    Start     Dose/Rate Route Frequency Ordered Stop   11/24/15 0600  piperacillin-tazobactam (ZOSYN) IVPB 3.375 g     3.375 g 12.5 mL/hr over 240 Minutes Intravenous Every 8 hours 11/23/15 2118     11/24/15 0400  vancomycin (VANCOCIN) IVPB 1000 mg/200 mL premix     1,000 mg 200 mL/hr over 60 Minutes Intravenous Every 12 hours 11/23/15 2117     11/23/15 2000  piperacillin-tazobactam (ZOSYN) IVPB 3.375 g     3.375 g 100 mL/hr over 30 Minutes Intravenous  Once 11/23/15 1957 11/23/15 2152   11/23/15 1945  vancomycin (VANCOCIN) IVPB 1000 mg/200 mL premix     1,000 mg 200 mL/hr over 60 Minutes Intravenous  Once 11/23/15 1936 11/23/15 2123   11/23/15 1930  clindamycin (CLEOCIN) IVPB 600 mg  Status:  Discontinued     600 mg 100 mL/hr over 30 Minutes Intravenous  Once 11/23/15 1920 11/23/15 1936        Note: Portions of this report may have been transcribed using voice recognition software. Every effort was made to ensure accuracy; however, inadvertent computerized transcription errors may be present.   Any transcriptional errors that result from this process are unintentional.     [name], M.D., F.A.C.S. Gastrointestinal and Minimally Invasive Surgery [facility] [address] [place], [place] [address] [phone] Main / Paging   11/24/2015

## 2015-11-24 NOTE — Progress Notes (Signed)
Notified Dr. Marcellina Millin that pt started complaining of itching in her hands/arms and feet around 04:45 about an hour after receiving 2nd dose of vancomycin 1gm IV. Also notified of Potassium level of 3.0. Awaiting call back/ new orders.

## 2015-11-24 NOTE — Progress Notes (Signed)
Initial Nutrition Assessment  DOCUMENTATION CODES:   Obesity unspecified  INTERVENTION:  Provide Ensure Enlive once daily, provides 350 kcal and 20 grams of protein Provide pro-stat once daily, provides 15 grams of protein and 100 kcal Provide Multivitamin with minerals daily   NUTRITION DIAGNOSIS:   Predicted suboptimal nutrient intake related to poor appetite as evidenced by per patient/family report, meal completion < 25%.   GOAL:   Patient will meet greater than or equal to 90% of their needs   MONITOR:   PO intake, Skin, I & O's, Labs  REASON FOR ASSESSMENT:   Malnutrition Screening Tool    ASSESSMENT:   60 year old with past medical history of obesity, MRSA cellulitis came in with recurrent valvular abscess, right axillary abscess and suprapubic, ID to evaluate.  Pt reports that she has had a decreased appetite for the past few days due to pain/discomfort. She denies any weight loss, stating that she usually weighs between 173 and 182 lbs. She states that she did not eat any breakfast and ate less than 25% of lunch. Pt states that she usually only eats 2 meals per day. RD stressed the importance of adequate healthful PO intake. Pt may benefit from supplements until pain resolves.   Labs: low sodium, low potassium, low chloride, low calcium  Diet Order:  Diet regular Room service appropriate? Yes; Fluid consistency: Thin Diet NPO time specified Except for: Sips with Meds, Ice Chips  Skin:  Wound (see comment) (cellulitis of abdomen, incision on peritoneum)  Last BM:  7/30  Height:   Ht Readings from Last 1 Encounters:  11/23/15 5' (1.524 m)    Weight:   Wt Readings from Last 1 Encounters:  11/23/15 179 lb (81.2 kg)    Ideal Body Weight:  45.5 kg  BMI:  Body mass index is 34.96 kg/m.  Estimated Nutritional Needs:   Kcal:  1600-1800  Protein:  90-100 grams  Fluid:  1.8 L/day  EDUCATION NEEDS:   No education needs identified at this  time  [place], LDN Inpatient Clinical Dietitian Pager: [phone] After Hours Pager: [phone]

## 2015-11-25 DIAGNOSIS — A419 Sepsis, unspecified organism: Secondary | ICD-10-CM

## 2015-11-25 LAB — LACTIC ACID, PLASMA
LACTIC ACID, VENOUS: 1 mmol/L (ref 0.5–1.9)
LACTIC ACID, VENOUS: 2.4 mmol/L — AB (ref 0.5–1.9)

## 2015-11-25 LAB — BASIC METABOLIC PANEL
ANION GAP: 10 (ref 5–15)
BUN: 12 mg/dL (ref 6–20)
CHLORIDE: 100 mmol/L — AB (ref 101–111)
CO2: 26 mmol/L (ref 22–32)
CREATININE: 1.14 mg/dL — AB (ref 0.44–1.00)
Calcium: 8 mg/dL — ABNORMAL LOW (ref 8.9–10.3)
GFR calc non Af Amer: 52 mL/min — ABNORMAL LOW (ref >60)
GFR, EST AFRICAN AMERICAN: 60 mL/min — AB (ref >60)
Glucose, Bld: 108 mg/dL — ABNORMAL HIGH (ref 65–99)
Potassium: 2.9 mmol/L — ABNORMAL LOW (ref 3.5–5.1)
SODIUM: 136 mmol/L (ref 135–145)

## 2015-11-25 LAB — CBC
HCT: 32.4 % — ABNORMAL LOW (ref 36.0–46.0)
HEMOGLOBIN: 11.4 g/dL — AB (ref 12.0–15.0)
MCH: 31.4 pg (ref 26.0–34.0)
MCHC: 35.2 g/dL (ref 30.0–36.0)
MCV: 89.3 fL (ref 78.0–100.0)
PLATELETS: 487 10*3/uL — AB (ref 150–400)
RBC: 3.63 MIL/uL — AB (ref 3.87–5.11)
RDW: 13.9 % (ref 11.5–15.5)
WBC: 17.6 10*3/uL — AB (ref 4.0–10.5)

## 2015-11-25 LAB — HIV ANTIBODY (ROUTINE TESTING W REFLEX): HIV Screen 4th Generation wRfx: NONREACTIVE

## 2015-11-25 LAB — MAGNESIUM: MAGNESIUM: 2.2 mg/dL (ref 1.7–2.4)

## 2015-11-25 LAB — PROCALCITONIN: Procalcitonin: 0.27 ng/mL

## 2015-11-25 MED ORDER — POTASSIUM CHLORIDE CRYS ER 20 MEQ PO TBCR
40.0000 meq | EXTENDED_RELEASE_TABLET | Freq: Four times a day (QID) | ORAL | Status: DC
  Filled 2015-11-25: qty 2

## 2015-11-25 MED ORDER — POTASSIUM CHLORIDE 2 MEQ/ML IV SOLN
INTRAVENOUS | Status: DC
  Administered 2015-11-25 – 2015-11-26 (×3): via INTRAVENOUS
  Filled 2015-11-25 (×4): qty 1000

## 2015-11-25 MED ORDER — POTASSIUM CHLORIDE CRYS ER 10 MEQ PO TBCR
40.0000 meq | EXTENDED_RELEASE_TABLET | Freq: Four times a day (QID) | ORAL | Status: AC
  Administered 2015-11-25 (×2): 40 meq via ORAL
  Filled 2015-11-25 (×2): qty 4

## 2015-11-25 NOTE — Progress Notes (Signed)
Patient ID: Ebony Stanley, female   DOB: 06-23-1955, 60 y.o.   MRN: [id number]  [facility]  Subjective: Patient in bed, comfortable.  Objective: Vital signs in last 24 hours: Temp:  [98.6 F (37 C)-102.1 F (38.9 C)] 98.6 F (37 C) (08/05 0603) Pulse Rate:  [60-99] 60 (08/05 0603) Resp:  [20] 20 (08/05 0603) BP: (99-115)/(59-63) 115/59 (08/05 0603) SpO2:  [99 %-100 %] 100 % (08/05 0603) Last BM Date: 11/24/15  Intake/Output from previous day: 08/04 0701 - 08/05 0700 In: 360 [P.O.:360] Out: 645 [Urine:645] Intake/Output this shift: No intake/output data recorded.  Physical Exam: HEENT - sclerae clear, mucous membranes moist Neck - soft Chest - clear bilaterally; right axilla with cellulitis, induration, marked tenderness; not fluctuent, no drainage Cor - RRR Abdomen - LLQ with cellulitis, erythema, induration, small ulceration; not fluctuent Ext - no edema, non-tender Neuro - alert & oriented, no focal deficits  Lab Results:   Recent Labs  11/24/15 0219 11/25/15 0452  WBC 25.5* 17.6*  HGB 12.0 11.4*  HCT 33.7* 32.4*  PLT 498* 487*   BMET  Recent Labs  11/24/15 0219 11/25/15 0452  NA 132* 136  K 3.0* 2.9*  CL 98* 100*  CO2 22 26  GLUCOSE 111* 108*  BUN 11 12  CREATININE 0.90 1.14*  CALCIUM 8.5* 8.0*   PT/INR No results for input(s): LABPROT, INR in the last 72 hours. Comprehensive Metabolic Panel:    Component Value Date/Time   NA 136 11/25/2015 0452   NA 132 (L) 11/24/2015 0219   K 2.9 (L) 11/25/2015 0452   K 3.0 (L) 11/24/2015 0219   CL 100 (L) 11/25/2015 0452   CL 98 (L) 11/24/2015 0219   CO2 26 11/25/2015 0452   CO2 22 11/24/2015 0219   BUN 12 11/25/2015 0452   BUN 11 11/24/2015 0219   CREATININE 1.14 (H) 11/25/2015 0452   CREATININE 0.90 11/24/2015 0219   CREATININE 0.70 11/30/2012 1355   CREATININE 0.56 05/23/2011 1534   GLUCOSE 108 (H) 11/25/2015 0452   GLUCOSE 111 (H) 11/24/2015 0219   CALCIUM 8.0 (L) 11/25/2015 0452   CALCIUM 8.5 (L) 11/24/2015 0219   AST 25 09/16/2014 0515   AST 29 11/30/2012 1355   ALT 30 09/16/2014 0515   ALT 32 11/30/2012 1355   ALKPHOS 51 09/16/2014 0515   ALKPHOS 61 11/30/2012 1355   BILITOT 0.2 (L) 09/16/2014 0515   BILITOT 0.4 11/30/2012 1355   PROT 7.5 09/16/2014 0515   PROT 6.6 11/30/2012 1355   ALBUMIN 4.4 09/16/2014 0515   ALBUMIN 4.3 11/30/2012 1355    Studies/Results: [place] Chest  Result Date: 11/23/2015 CLINICAL DATA:  Recent abscess drainage from the vulva on 7 05/22/2015. Recent right axillary lump felt for approximately 1 week ago. This has increased in size. Area is red and hot. EXAM: CHEST ULTRASOUND COMPARISON:  None. FINDINGS: In the inferior aspect of the right axilla, there is a 12 x 6 x 16 mm complex appearing mass or collection. The surrounding fat appears echogenic consistent with inflammation. More superiorly there is an irregular fluid collection neck contacts the dermis. It measures 2.8 x 1.7 x 2.6 cm. IMPRESSION: 1. There is inflammation in the right axilla. In the superior axilla there is a irregular complex collection measuring 2.8 x 1.7 x 2.6 cm and may connect to the scan. This could reflect an infected inclusion cyst. 2. More inferiorly in the right axilla is in more discrete complex collection versus a possible  mass or inflamed lymph node. It measures 12 x 6 x 16 mm. 3. There is increased echogenicity throughout the axillary fat consistent with diffuse inflammation. Electronically Signed   By: Lajean Manes M.D.   On: 11/23/2015 21:30   US Pelvis Limited  Result Date: 11/23/2015 CLINICAL DATA:  Redness and induration of the right lower quadrant anterior abdominal wall. EXAM: US PELVIS LIMITED TECHNIQUE: Ultrasound examination of the pelvic soft tissues was performed in the area of clinical concern. COMPARISON:  None. FINDINGS: Limited sonographic evaluation of the right lower quadrant of the lower abdominal wall demonstrates  a subcutaneous irregular hypoechoic mass versus complex fluid collection measuring 3.3 x 2.6 by 3.5. Communication with the skin surface is noted on several of the static images. Mobile debris also noted within this collection. IMPRESSION: Right paramedial lower anterior abdominal wall subcutaneous phlegmon collection with apparent communication to the skin, 3.5 cm in greatest dimension. Electronically Signed   By: Fidela Salisbury M.D.   On: 11/23/2015 21:28    Assessment & Plans: Right axillary cellulitis / left lower abdominal wall cellulitis  No clear abscesses requiring drainage at this time  Improvement on IV abx - Vanco and Clinda  WBC improved this AM  Allow diet today - ordered clear liquids to start  Will re-evaluate in AM 8/6 - may yet require operative debridement if fluctuence develops or patient fails to progress  Earnstine Regal, MD, [facility] Office: [place] 11/25/2015

## 2015-11-25 NOTE — Progress Notes (Signed)
PROGRESS NOTE  Ebony Stanley  [id number] DOB: November 26, 1955 DOA: 11/23/2015 PCP: Robyn Haber, MD Outpatient Specialists:  Subjective: Fever of 102.1 last night, but this morning feels much better with WBCs improved.  Brief Narrative:  60 year old with past medical history of obesity, MRSA cellulitis came in with recurrent valvular abscess, right axillary abscess and suprapubic, ID to evaluate.  Assessment & Plan:   Principal Problem:   Cellulitis of right axilla Active Problems:   Depression   Abscess of vulva s/p I&D   Cellulitis of abdominal wall   Obesity (BMI 30-39.9)   Hypokalemia   Leukocytosis   MRSA (methicillin resistant staph aureus) culture positive   Cellulitis   Sepsis -Present on admission, Temperature of 102.1, WBCs of 25.5 on admission and presence of infection. -Lactic acid was not checked, will check it. -This is secondary to multiple areas of cellulitis, follow blood cultures. Continue broad-spectrum antibiotics.  Cellulitis -Patient with multiple areas of cellulitis (under right arm and suprapubic area) with possible developing abscess.   -Groin/labial abscess already drained and MRSA positive -Presume that this is a diffuse MRSA infection (HIV negative) -Blood cultures obtained, cannot rule out transient bacteremia as cause of the multiple cellulitis. -Failed outpatient oral antibiotics, initially was on Zosyn and vancomycin, switched to vancomycin and Clinda -ID to evaluate ? Hydradenitis suppurativa. Discussed with Dr. Johney Maine, does not have drainable abscess for now.  Abscess of the vulva -Has 3 different abscesses in her vulva, this was seated and drained by OB/GYN. -This is a grew MRSA.  Hypokalemia -Severe hypokalemia with potassium of 2.9, was not able to take all potassium supplements yesterday due to nausea. -Magnesium is normal. Replete with oral and IV supplements.  Depression -Continue Wellbutrin   DVT prophylaxis: Subcutaneous  heparin Code Status: Full Code Family Communication:  Disposition Plan:  Diet: Diet clear liquid Room service appropriate? Yes; Fluid consistency: Thin  Consultants:   Gen. surgery.  Infectious disease.  Procedures:   None  Antimicrobials:   Zosyn and clindamycin  Objective: Vitals:   11/24/15 2112 11/24/15 2200 11/24/15 2245 11/25/15 0603  BP: 110/63   (!) 115/59  Pulse: 63   60  Resp: 20   20  Temp: (!) 102.1 F (38.9 C) (!) 101.9 F (38.8 C) 100.3 F (37.9 C) 98.6 F (37 C)  TempSrc: Oral Oral Oral Oral  SpO2: 99%   100%  Weight:      Height:        Intake/Output Summary (Last 24 hours) at 11/25/15 1124 Last data filed at 11/25/15 0857  Gross per 24 hour  Intake              360 ml  Output              120 ml  Net              240 ml   Filed Weights   11/23/15 2245  Weight: 81.2 kg (179 lb)    Examination: General exam: Appears calm and comfortable  Respiratory system: Clear to auscultation. Respiratory effort normal. Cardiovascular system: S1 & S2 heard, RRR. No JVD, murmurs, rubs, gallops or clicks. No pedal edema. Gastrointestinal system: Abdomen is nondistended, soft and nontender. No organomegaly or masses felt. Normal bowel sounds heard. Central nervous system: Alert and oriented. No focal neurological deficits. Extremities: Symmetric 5 x 5 power. Skin: No rashes, lesions or ulcers Psychiatry: Judgement and insight appear normal. Mood & affect appropriate.   Data Reviewed: I have personally  reviewed following labs and imaging studies  CBC:  Recent Labs Lab 11/20/15 1713 11/23/15 1950 11/24/15 0219 11/25/15 0452  WBC 17.8* 26.9* 25.5* 17.6*  NEUTROABS 14.0* 22.6*  --   --   HGB 14.0 12.4 12.0 11.4*  HCT 38.9 35.5* 33.7* 32.4*  MCV 87.8 89.0 88.0 89.3  PLT 422* 461* 498* 0000000*   Basic Metabolic Panel:  Recent Labs Lab 11/23/15 1950 11/24/15 0219 11/25/15 0452  NA 135 132* 136  K 3.1* 3.0* 2.9*  CL 100* 98* 100*  CO2 24 22 26     GLUCOSE 93 111* 108*  BUN 13 11 12   CREATININE 0.90 0.90 1.14*  CALCIUM 9.0 8.5* 8.0*  MG  --   --  2.2   GFR: Estimated Creatinine Clearance: 50.2 mL/min (by C-G formula based on SCr of 1.14 mg/dL). Liver Function Tests: No results for input(s): AST, ALT, ALKPHOS, BILITOT, PROT, ALBUMIN in the last 168 hours. No results for input(s): LIPASE, AMYLASE in the last 168 hours. No results for input(s): AMMONIA in the last 168 hours. Coagulation Profile: No results for input(s): INR, PROTIME in the last 168 hours. Cardiac Enzymes: No results for input(s): CKTOTAL, CKMB, CKMBINDEX, TROPONINI in the last 168 hours. BNP (last 3 results) No results for input(s): PROBNP in the last 8760 hours. HbA1C: No results for input(s): HGBA1C in the last 72 hours. CBG: No results for input(s): GLUCAP in the last 168 hours. Lipid Profile: No results for input(s): CHOL, HDL, LDLCALC, TRIG, CHOLHDL, LDLDIRECT in the last 72 hours. Thyroid Function Tests: No results for input(s): TSH, T4TOTAL, FREET4, T3FREE, THYROIDAB in the last 72 hours. Anemia Panel: No results for input(s): VITAMINB12, FOLATE, FERRITIN, TIBC, IRON, RETICCTPCT in the last 72 hours. Urine analysis:    Component Value Date/Time   COLORURINE YELLOW 09/16/2014 0714   APPEARANCEUR CLOUDY (A) 09/16/2014 0714   LABSPEC 1.024 09/16/2014 0714   PHURINE 6.0 09/16/2014 0714   GLUCOSEU NEGATIVE 09/16/2014 0714   HGBUR LARGE (A) 09/16/2014 0714   BILIRUBINUR NEGATIVE 09/16/2014 0714   BILIRUBINUR n 11/30/2012 1256   KETONESUR 40 (A) 09/16/2014 0714   PROTEINUR NEGATIVE 09/16/2014 0714   UROBILINOGEN 0.2 09/16/2014 0714   NITRITE NEGATIVE 09/16/2014 0714   LEUKOCYTESUR NEGATIVE 09/16/2014 0714   Sepsis Labs: @LABRCNTIP (procalcitonin:4,lacticidven:4)  ) Recent Results (from the past 240 hour(s))  Aerobic/Anaerobic Culture (surgical/deep wound)     Status: None (Preliminary result)   Collection Time: 11/20/15  6:53 PM  Result Value  Ref Range Status   Specimen Description ABSCESS  Final   Special Requests VULVA LESIONS  Final   Gram Stain   Final    ABUNDANT WBC PRESENT, PREDOMINANTLY PMN RARE GRAM POSITIVE COCCI IN PAIRS Performed at [facility]    Culture   Final    FEW METHICILLIN RESISTANT STAPHYLOCOCCUS AUREUS NO ANAEROBES ISOLATED; CULTURE IN PROGRESS FOR 5 DAYS    Report Status PENDING  Incomplete   Organism ID, Bacteria METHICILLIN RESISTANT STAPHYLOCOCCUS AUREUS  Final      Susceptibility   Methicillin resistant staphylococcus aureus - MIC*    CIPROFLOXACIN >=8 RESISTANT Resistant     ERYTHROMYCIN >=8 RESISTANT Resistant     GENTAMICIN <=0.5 SENSITIVE Sensitive     OXACILLIN >=4 RESISTANT Resistant     TETRACYCLINE <=1 SENSITIVE Sensitive     VANCOMYCIN <=0.5 SENSITIVE Sensitive     TRIMETH/SULFA <=10 SENSITIVE Sensitive     CLINDAMYCIN <=0.25 SENSITIVE Sensitive     RIFAMPIN <=0.5 SENSITIVE Sensitive  Inducible Clindamycin NEGATIVE Sensitive     * FEW METHICILLIN RESISTANT STAPHYLOCOCCUS AUREUS  Culture, blood (routine x 2)     Status: None (Preliminary result)   Collection Time: 11/23/15 10:33 PM  Result Value Ref Range Status   Specimen Description BLOOD LEFT HAND  Final   Special Requests BOTTLES DRAWN AEROBIC AND ANAEROBIC 3CC EACH  Final   Culture   Final    NO GROWTH < 24 HOURS Performed at [facility]    Report Status PENDING  Incomplete  Culture, blood (routine x 2)     Status: None (Preliminary result)   Collection Time: 11/24/15  2:00 AM  Result Value Ref Range Status   Specimen Description BLOOD LEFT ARM  Final   Special Requests   Final    BOTTLES DRAWN AEROBIC AND ANAEROBIC 9 CC BOTH BOTTLES   Culture   Final    NO GROWTH < 12 HOURS Performed at [facility]    Report Status PENDING  Incomplete     Invalid input(s): PROCALCITONIN, LACTICACIDVEN   Radiology Studies: [place] Chest  Result Date: 11/23/2015 CLINICAL DATA:  Recent abscess drainage from  the vulva on 7 05/22/2015. Recent right axillary lump felt for approximately 1 week ago. This has increased in size. Area is red and hot. EXAM: CHEST ULTRASOUND COMPARISON:  None. FINDINGS: In the inferior aspect of the right axilla, there is a 12 x 6 x 16 mm complex appearing mass or collection. The surrounding fat appears echogenic consistent with inflammation. More superiorly there is an irregular fluid collection neck contacts the dermis. It measures 2.8 x 1.7 x 2.6 cm. IMPRESSION: 1. There is inflammation in the right axilla. In the superior axilla there is a irregular complex collection measuring 2.8 x 1.7 x 2.6 cm and may connect to the scan. This could reflect an infected inclusion cyst. 2. More inferiorly in the right axilla is in more discrete complex collection versus a possible mass or inflamed lymph node. It measures 12 x 6 x 16 mm. 3. There is increased echogenicity throughout the axillary fat consistent with diffuse inflammation. Electronically Signed   By: Lajean Manes M.D.   On: 11/23/2015 21:30   US Pelvis Limited  Result Date: 11/23/2015 CLINICAL DATA:  Redness and induration of the right lower quadrant anterior abdominal wall. EXAM: US PELVIS LIMITED TECHNIQUE: Ultrasound examination of the pelvic soft tissues was performed in the area of clinical concern. COMPARISON:  None. FINDINGS: Limited sonographic evaluation of the right lower quadrant of the lower abdominal wall demonstrates a subcutaneous irregular hypoechoic mass versus complex fluid collection measuring 3.3 x 2.6 by 3.5. Communication with the skin surface is noted on several of the static images. Mobile debris also noted within this collection. IMPRESSION: Right paramedial lower anterior abdominal wall subcutaneous phlegmon collection with apparent communication to the skin, 3.5 cm in greatest dimension. Electronically Signed   By: Fidela Salisbury M.D.   On: 11/23/2015 21:28        Scheduled Meds: . acetaminophen   1,000 mg Oral TID  . buPROPion  150 mg Oral Daily  . celecoxib  200 mg Oral QHS  . Chlorhexidine Gluconate Cloth  6 each Topical Q0600  . enoxaparin (LOVENOX) injection  40 mg Subcutaneous Q24H  . estradiol  1 mg Oral QHS  . feeding supplement (ENSURE ENLIVE)  237 mL Oral Q24H  . feeding supplement (PRO-STAT SUGAR FREE 64)  30 mL Oral Daily  . lip balm  1 application Topical  BID  . multivitamin with minerals  1 tablet Oral Daily  . mupirocin ointment  1 application Nasal BID  . potassium chloride  40 mEq Oral Q6H  . progesterone  100 mg Oral QHS  . senna  1 tablet Oral BID  . vancomycin  1,000 mg Intravenous Q12H  . vitamin C  500 mg Oral BID   Continuous Infusions: . sodium chloride 0.9 % 1,000 mL with potassium chloride 40 mEq infusion 125 mL/hr at 11/25/15 0830     LOS: 1 day    Time spent: 35 minutes    Shaquana Buel A, MD [facility] Pager [phone]  If 7PM-7AM, please contact night-coverage [email or web address] Password [email or web address] 11/25/2015, 11:24 AM

## 2015-11-25 NOTE — Progress Notes (Signed)
Notified physician lab called with critical Lactic Acid of 2.4 for patient.  No orders received.

## 2015-11-26 DIAGNOSIS — A419 Sepsis, unspecified organism: Principal | ICD-10-CM

## 2015-11-26 LAB — CBC
HEMATOCRIT: 32.3 % — AB (ref 36.0–46.0)
HEMOGLOBIN: 10.9 g/dL — AB (ref 12.0–15.0)
MCH: 30.9 pg (ref 26.0–34.0)
MCHC: 33.7 g/dL (ref 30.0–36.0)
MCV: 91.5 fL (ref 78.0–100.0)
Platelets: 475 10*3/uL — ABNORMAL HIGH (ref 150–400)
RBC: 3.53 MIL/uL — ABNORMAL LOW (ref 3.87–5.11)
RDW: 14.6 % (ref 11.5–15.5)
WBC: 17.5 10*3/uL — AB (ref 4.0–10.5)

## 2015-11-26 LAB — BASIC METABOLIC PANEL
ANION GAP: 5 (ref 5–15)
BUN: 10 mg/dL (ref 6–20)
CHLORIDE: 109 mmol/L (ref 101–111)
CO2: 26 mmol/L (ref 22–32)
Calcium: 7.6 mg/dL — ABNORMAL LOW (ref 8.9–10.3)
Creatinine, Ser: 1.11 mg/dL — ABNORMAL HIGH (ref 0.44–1.00)
GFR calc non Af Amer: 53 mL/min — ABNORMAL LOW (ref >60)
Glucose, Bld: 109 mg/dL — ABNORMAL HIGH (ref 65–99)
POTASSIUM: 5.3 mmol/L — AB (ref 3.5–5.1)
Sodium: 140 mmol/L (ref 135–145)

## 2015-11-26 LAB — AEROBIC/ANAEROBIC CULTURE (SURGICAL/DEEP WOUND)

## 2015-11-26 LAB — AEROBIC/ANAEROBIC CULTURE W GRAM STAIN (SURGICAL/DEEP WOUND)

## 2015-11-26 LAB — HEPATITIS C ANTIBODY: HCV AB: 0.1 {s_co_ratio} (ref 0.0–0.9)

## 2015-11-26 LAB — VANCOMYCIN, TROUGH: VANCOMYCIN TR: 18 ug/mL (ref 15–20)

## 2015-11-26 MED ORDER — SODIUM CHLORIDE 0.9 % IV SOLN
INTRAVENOUS | Status: DC
  Administered 2015-11-26 – 2015-11-27 (×3): via INTRAVENOUS

## 2015-11-26 NOTE — Progress Notes (Addendum)
Pharmacy Antibiotic Follow-up Note  Ebony Stanley is a 60 y.o. year-old female admitted on 11/23/2015.  The patient is currently on day 3 of Vancomycin for MRSA cellulitis.   11/26/2015:  WBC elevated, but trending down  Currently afebrile, but has spiked fevers last night (Tm 101.2F)  Renal function stable, estimated CrCl ~31ml/min  Concern for developing abscess with possible I&D tomorrow  Will check Vanc trough with next dose  Assessment/Plan: Continue Vancomycin 1gm IV q12h  Vancomycin trough at 3pm, today Monitor renal function and cx data   Temp (24hrs), Avg:99.8 F (37.7 C), Min:98.7 F (37.1 C), Max:101.1 F (38.4 C)   Recent Labs Lab 11/20/15 1713 11/23/15 1950 11/24/15 0219 11/25/15 0452 11/26/15 0455  WBC 17.8* 26.9* 25.5* 17.6* 17.5*     Recent Labs Lab 11/23/15 1950 11/24/15 0219 11/25/15 0452 11/26/15 0455  CREATININE 0.90 0.90 1.14* 1.11*   Estimated Creatinine Clearance: 51.5 mL/min (by C-G formula based on SCr of 1.11 mg/dL).    Allergies  Allergen Reactions  . Kenalog [Triamcinolone] Anaphylaxis    Was mixed with lidocaine, unsure which caused the reaction   . Lidocaine Anaphylaxis    Was mixed with the Kenalog, unsure which medication caused the reaction Pt repots she received Lidocaine since that time without trouble.   Antimicrobials this admission: 8/3 Vancomycin  >>  8/3 Zosyn >> 8/4 8/4 Clinda IV >> 8/5 (3 doses/ID)  Levels/dose changes this admission: 8/6 @ 3pm VT= 18 on Vanc 1gm IV q12h (prior to 7th dose)  Microbiology results: 7/31 vulva lesions: MRSA 7/31 HIV: NR 8/4 BCx: NGTD  Thank you for allowing pharmacy to be a part of this patient's care.  Netta Cedars, PharmD, BCPS Pager: [phone] 11/26/2015 1:06 PM   Addendum: [name] trough therapeutic at 38mcg/ml.  Will continue current dose.   Netta Cedars, PharmD, BCPS Pager: [phone] 11/26/2015@4 :54 PM

## 2015-11-26 NOTE — Progress Notes (Signed)
Patient ID: Ebony Stanley, female   DOB: 1955/08/31, 60 y.o.   MRN: [id number]  [facility]  Subjective: Patient in bed, comfortable, less pain than yesterday.  No drainage.  Objective: Vital signs in last 24 hours: Temp:  [98.7 F (37.1 C)-101.1 F (38.4 C)] 99.6 F (37.6 C) (08/06 0525) Pulse Rate:  [97-101] 100 (08/06 0525) Resp:  [16-17] 16 (08/06 0525) BP: (106-113)/(62-69) 112/63 (08/06 0525) SpO2:  [97 %-100 %] 97 % (08/06 0525) Last BM Date: 11/25/15  Intake/Output from previous day: 08/05 0701 - 08/06 0700 In: 1260 [P.O.:1260] Out: -  Intake/Output this shift: No intake/output data recorded.  Physical Exam: HEENT - sclerae clear, mucous membranes moist Chest - right axilla with slight improved erythema, induration; no fluctuence but softer centrally, less tender Abdomen - LLQ erythema and induration in pannus minimally improved, some softening centrally with small ulceration, no drainage Ext - no edema, non-tender Neuro - alert & oriented, no focal deficits  Lab Results:   Recent Labs  11/25/15 0452 11/26/15 0455  WBC 17.6* 17.5*  HGB 11.4* 10.9*  HCT 32.4* 32.3*  PLT 487* 475*   BMET  Recent Labs  11/25/15 0452 11/26/15 0455  NA 136 140  K 2.9* 5.3*  CL 100* 109  CO2 26 26  GLUCOSE 108* 109*  BUN 12 10  CREATININE 1.14* 1.11*  CALCIUM 8.0* 7.6*   PT/INR No results for input(s): LABPROT, INR in the last 72 hours. Comprehensive Metabolic Panel:    Component Value Date/Time   NA 140 11/26/2015 0455   NA 136 11/25/2015 0452   K 5.3 (H) 11/26/2015 0455   K 2.9 (L) 11/25/2015 0452   CL 109 11/26/2015 0455   CL 100 (L) 11/25/2015 0452   CO2 26 11/26/2015 0455   CO2 26 11/25/2015 0452   BUN 10 11/26/2015 0455   BUN 12 11/25/2015 0452   CREATININE 1.11 (H) 11/26/2015 0455   CREATININE 1.14 (H) 11/25/2015 0452   CREATININE 0.70 11/30/2012 1355   CREATININE 0.56 05/23/2011 1534   GLUCOSE 109 (H)  11/26/2015 0455   GLUCOSE 108 (H) 11/25/2015 0452   CALCIUM 7.6 (L) 11/26/2015 0455   CALCIUM 8.0 (L) 11/25/2015 0452   AST 25 09/16/2014 0515   AST 29 11/30/2012 1355   ALT 30 09/16/2014 0515   ALT 32 11/30/2012 1355   ALKPHOS 51 09/16/2014 0515   ALKPHOS 61 11/30/2012 1355   BILITOT 0.2 (L) 09/16/2014 0515   BILITOT 0.4 11/30/2012 1355   PROT 7.5 09/16/2014 0515   PROT 6.6 11/30/2012 1355   ALBUMIN 4.4 09/16/2014 0515   ALBUMIN 4.3 11/30/2012 1355    Studies/Results: No results found.  Assessment & Plans: 1. Right axillary cellulitis  No clear cut abscess on exam - may yet "wall off" small abscess as induration and cellulitis improve  No operative intervention today - will make NPO after midnight and reassess in AM 8/7 2. Abdominal wall cellulitis  As above  Will follow.  Allow to eat today.  Continue abx's.  Earnstine Regal, MD, [facility] Office: [place] 11/26/2015

## 2015-11-26 NOTE — Progress Notes (Signed)
PROGRESS NOTE  Ebony Stanley  [id number] DOB: [birth year] DOA: 11/23/2015 PCP: Robyn Haber, MD Outpatient Specialists:  Subjective: Fever of 101.1 last night, WBCs still elevated at 17.5. Overall feels better. Nothing by mouth past midnight to reassess for possible drainage.  Brief Narrative:  60 year old with past medical history of obesity, MRSA cellulitis came in with recurrent valvular abscess, right axillary abscess and suprapubic, ID to evaluate.  Assessment & Plan:   Principal Problem:   Cellulitis of right axilla Active Problems:   Depression   Abscess of vulva s/p I&D   Cellulitis of abdominal wall   Obesity (BMI 30-39.9)   Hypokalemia   Leukocytosis   MRSA (methicillin resistant staph aureus) culture positive   Cellulitis   Sepsis ([place])   Sepsis -Present on admission, Temperature of 102.1, WBCs of 25.5 on admission and presence of infection. -Initial lactic acid was 1.0, second check was 2.4, procalcitonin 0.27. -This is secondary to multiple areas of cellulitis, follow blood cultures. Continue broad-spectrum antibiotics. -Continue to spike fevers, but it does not have obvious drainable abscess.  Cellulitis -Patient with multiple areas of cellulitis (under right arm and suprapubic area) with possible developing abscess.   -Groin/labial abscess already drained and MRSA positive -Presume that this is a diffuse MRSA infection (HIV negative) -Blood cultures obtained, cannot rule out transient bacteremia as cause of the multiple cellulitis. -Failed outpatient oral antibiotics, initially was on Zosyn and vancomycin, switched to vancomycin and Clinda -Continue current antibiotics, no changes.  Abscess of the vulva -Has 3 different abscesses in her vulva, this was seated and drained by OB/GYN. -This is a grew MRSA.  Hypokalemia -Severe hypokalemia with potassium of 2.9, this is repleted with oral supplements.  Depression -Continue Wellbutrin   DVT  prophylaxis: Subcutaneous heparin Code Status: Full Code Family Communication:  Disposition Plan:  Diet: Diet regular Room service appropriate? Yes; Fluid consistency: Thin Diet NPO time specified  Consultants:   Gen. surgery.  Infectious disease.  Procedures:   None  Antimicrobials:   Zosyn and clindamycin  Objective: Vitals:   11/25/15 1518 11/25/15 1849 11/25/15 2215 11/26/15 0525  BP:   106/69 112/63  Pulse:   (!) 101 100  Resp:   16 16  Temp: 100.1 F (37.8 C) 98.7 F (37.1 C) 99.3 F (37.4 C) 99.6 F (37.6 C)  TempSrc: Oral Oral Oral Oral  SpO2:   100% 97%  Weight:      Height:        Intake/Output Summary (Last 24 hours) at 11/26/15 1134 Last data filed at 11/26/15 0820  Gross per 24 hour  Intake              900 ml  Output                0 ml  Net              900 ml   Filed Weights   11/23/15 2245  Weight: 81.2 kg (179 lb)    Examination: General exam: Appears calm and comfortable  Respiratory system: Clear to auscultation. Respiratory effort normal. Cardiovascular system: S1 & S2 heard, RRR. No JVD, murmurs, rubs, gallops or clicks. No pedal edema. Gastrointestinal system: Abdomen is nondistended, soft and nontender. No organomegaly or masses felt. Normal bowel sounds heard. Central nervous system: Alert and oriented. No focal neurological deficits. Extremities: Symmetric 5 x 5 power. Skin: No rashes, lesions or ulcers Psychiatry: Judgement and insight appear normal. Mood & affect appropriate.   Data  Reviewed: I have personally reviewed following labs and imaging studies  CBC:  Recent Labs Lab 11/20/15 1713 11/23/15 1950 11/24/15 0219 11/25/15 0452 11/26/15 0455  WBC 17.8* 26.9* 25.5* 17.6* 17.5*  NEUTROABS 14.0* 22.6*  --   --   --   HGB 14.0 12.4 12.0 11.4* 10.9*  HCT 38.9 35.5* 33.7* 32.4* 32.3*  MCV 87.8 89.0 88.0 89.3 91.5  PLT 422* 461* 498* 487* [id number]*   Basic Metabolic Panel:  Recent Labs Lab 11/23/15 1950  11/24/15 0219 11/25/15 0452 11/26/15 0455  NA 135 132* 136 140  K 3.1* 3.0* 2.9* 5.3*  CL 100* 98* 100* 109  CO2 24 22 26 26   GLUCOSE 93 111* 108* 109*  BUN 13 11 12 10   CREATININE 0.90 0.90 1.14* 1.11*  CALCIUM 9.0 8.5* 8.0* 7.6*  MG  --   --  2.2  --    GFR: Estimated Creatinine Clearance: 51.5 mL/min (by C-G formula based on SCr of 1.11 mg/dL). Liver Function Tests: No results for input(s): AST, ALT, ALKPHOS, BILITOT, PROT, ALBUMIN in the last 168 hours. No results for input(s): LIPASE, AMYLASE in the last 168 hours. No results for input(s): AMMONIA in the last 168 hours. Coagulation Profile: No results for input(s): INR, PROTIME in the last 168 hours. Cardiac Enzymes: No results for input(s): CKTOTAL, CKMB, CKMBINDEX, TROPONINI in the last 168 hours. BNP (last 3 results) No results for input(s): PROBNP in the last 8760 hours. HbA1C: No results for input(s): HGBA1C in the last 72 hours. CBG: No results for input(s): GLUCAP in the last 168 hours. Lipid Profile: No results for input(s): CHOL, HDL, LDLCALC, TRIG, CHOLHDL, LDLDIRECT in the last 72 hours. Thyroid Function Tests: No results for input(s): TSH, T4TOTAL, FREET4, T3FREE, THYROIDAB in the last 72 hours. Anemia Panel: No results for input(s): VITAMINB12, FOLATE, FERRITIN, TIBC, IRON, RETICCTPCT in the last 72 hours. Urine analysis:    Component Value Date/Time   COLORURINE YELLOW 09/16/2014 0714   APPEARANCEUR CLOUDY (A) 09/16/2014 0714   LABSPEC 1.024 09/16/2014 0714   PHURINE 6.0 09/16/2014 0714   GLUCOSEU NEGATIVE 09/16/2014 0714   HGBUR LARGE (A) 09/16/2014 0714   BILIRUBINUR NEGATIVE 09/16/2014 0714   BILIRUBINUR n 11/30/2012 1256   KETONESUR 40 (A) 09/16/2014 0714   PROTEINUR NEGATIVE 09/16/2014 0714   UROBILINOGEN 0.2 09/16/2014 0714   NITRITE NEGATIVE 09/16/2014 0714   LEUKOCYTESUR NEGATIVE 09/16/2014 0714   Sepsis Labs: @LABRCNTIP (procalcitonin:4,lacticidven:4)  ) Recent Results (from the past  240 hour(s))  Aerobic/Anaerobic Culture (surgical/deep wound)     Status: None (Preliminary result)   Collection Time: 11/20/15  6:53 PM  Result Value Ref Range Status   Specimen Description ABSCESS  Final   Special Requests VULVA LESIONS  Final   Gram Stain   Final    ABUNDANT WBC PRESENT, PREDOMINANTLY PMN RARE GRAM POSITIVE COCCI IN PAIRS Performed at [facility]    Culture   Final    Sarah Ann NO ANAEROBES ISOLATED; CULTURE IN PROGRESS FOR 5 DAYS    Report Status PENDING  Incomplete   Organism ID, Bacteria METHICILLIN RESISTANT STAPHYLOCOCCUS AUREUS  Final      Susceptibility   Methicillin resistant staphylococcus aureus - MIC*    CIPROFLOXACIN >=8 RESISTANT Resistant     ERYTHROMYCIN >=8 RESISTANT Resistant     GENTAMICIN <=0.5 SENSITIVE Sensitive     OXACILLIN >=4 RESISTANT Resistant     TETRACYCLINE <=1 SENSITIVE Sensitive     VANCOMYCIN <=0.5 SENSITIVE Sensitive  TRIMETH/SULFA <=10 SENSITIVE Sensitive     CLINDAMYCIN <=0.25 SENSITIVE Sensitive     RIFAMPIN <=0.5 SENSITIVE Sensitive     Inducible Clindamycin NEGATIVE Sensitive     * FEW METHICILLIN RESISTANT STAPHYLOCOCCUS AUREUS  Culture, blood (routine x 2)     Status: None (Preliminary result)   Collection Time: 11/23/15 10:33 PM  Result Value Ref Range Status   Specimen Description BLOOD LEFT HAND  Final   Special Requests BOTTLES DRAWN AEROBIC AND ANAEROBIC 3CC EACH  Final   Culture   Final    NO GROWTH 1 DAY Performed at [facility]    Report Status PENDING  Incomplete  Culture, blood (routine x 2)     Status: None (Preliminary result)   Collection Time: 11/24/15  2:00 AM  Result Value Ref Range Status   Specimen Description BLOOD LEFT ARM  Final   Special Requests   Final    BOTTLES DRAWN AEROBIC AND ANAEROBIC 9 CC BOTH BOTTLES   Culture   Final    NO GROWTH 1 DAY Performed at [facility]    Report Status PENDING  Incomplete     Invalid  input(s): PROCALCITONIN, LACTICACIDVEN   Radiology Studies: No results found.      Scheduled Meds: . acetaminophen  1,000 mg Oral TID  . buPROPion  150 mg Oral Daily  . celecoxib  200 mg Oral QHS  . Chlorhexidine Gluconate Cloth  6 each Topical Q0600  . enoxaparin (LOVENOX) injection  40 mg Subcutaneous Q24H  . estradiol  1 mg Oral QHS  . feeding supplement (ENSURE ENLIVE)  237 mL Oral Q24H  . feeding supplement (PRO-STAT SUGAR FREE 64)  30 mL Oral Daily  . lip balm  1 application Topical BID  . multivitamin with minerals  1 tablet Oral Daily  . mupirocin ointment  1 application Nasal BID  . progesterone  100 mg Oral QHS  . senna  1 tablet Oral BID  . vancomycin  1,000 mg Intravenous Q12H  . vitamin C  500 mg Oral BID   Continuous Infusions: . sodium chloride 100 mL/hr at 11/26/15 0746     LOS: 2 days    Time spent: 35 minutes    Kortnee Bas A, MD [facility] Pager [phone]  If 7PM-7AM, please contact night-coverage [email or web address] Password [email or web address] 11/26/2015, 11:34 AM

## 2015-11-27 ENCOUNTER — Encounter (HOSPITAL_COMMUNITY): Payer: Self-pay | Admitting: Registered Nurse

## 2015-11-27 ENCOUNTER — Inpatient Hospital Stay (HOSPITAL_COMMUNITY): Payer: BC Managed Care – PPO | Admitting: Registered Nurse

## 2015-11-27 ENCOUNTER — Encounter (HOSPITAL_COMMUNITY)
Admission: EM | Disposition: A | Discharge status: Home Health | Payer: Self-pay | Source: Home / Self Care | Attending: Family Medicine

## 2015-11-27 HISTORY — PX: IRRIGATION AND DEBRIDEMENT ABSCESS: SHX5252

## 2015-11-27 LAB — BASIC METABOLIC PANEL
Anion gap: 6 (ref 5–15)
BUN: 9 mg/dL (ref 6–20)
CHLORIDE: 103 mmol/L (ref 101–111)
CO2: 25 mmol/L (ref 22–32)
Calcium: 7.1 mg/dL — ABNORMAL LOW (ref 8.9–10.3)
Creatinine, Ser: 1.1 mg/dL — ABNORMAL HIGH (ref 0.44–1.00)
GFR calc non Af Amer: 54 mL/min — ABNORMAL LOW (ref >60)
Glucose, Bld: 106 mg/dL — ABNORMAL HIGH (ref 65–99)
POTASSIUM: 3.6 mmol/L (ref 3.5–5.1)
SODIUM: 134 mmol/L — AB (ref 135–145)

## 2015-11-27 LAB — SURGICAL PCR SCREEN
MRSA, PCR: INVALID — AB
STAPHYLOCOCCUS AUREUS: INVALID — AB

## 2015-11-27 SURGERY — IRRIGATION AND DEBRIDEMENT ABSCESS
Anesthesia: General | Site: Abdomen | Laterality: Right

## 2015-11-27 MED ORDER — 0.9 % SODIUM CHLORIDE (POUR BTL) OPTIME
TOPICAL | Status: DC | PRN
  Administered 2015-11-27: 1000 mL

## 2015-11-27 MED ORDER — HYDROMORPHONE HCL 1 MG/ML IJ SOLN
INTRAMUSCULAR | Status: AC
  Administered 2015-11-27: 0.5 mg via INTRAVENOUS
  Filled 2015-11-27: qty 2

## 2015-11-27 MED ORDER — PROPOFOL 10 MG/ML IV BOLUS
INTRAVENOUS | Status: AC
  Filled 2015-11-27: qty 20

## 2015-11-27 MED ORDER — SODIUM CHLORIDE 0.9 % IV SOLN
INTRAVENOUS | Status: DC
  Administered 2015-11-27 – 2015-11-29 (×5): via INTRAVENOUS
  Filled 2015-11-27 (×7): qty 1000

## 2015-11-27 MED ORDER — MIDAZOLAM HCL 5 MG/5ML IJ SOLN
INTRAMUSCULAR | Status: DC | PRN
  Administered 2015-11-27: 2 mg via INTRAVENOUS

## 2015-11-27 MED ORDER — FENTANYL CITRATE (PF) 250 MCG/5ML IJ SOLN
INTRAMUSCULAR | Status: AC
  Filled 2015-11-27: qty 5

## 2015-11-27 MED ORDER — PROPOFOL 10 MG/ML IV BOLUS
INTRAVENOUS | Status: DC | PRN
  Administered 2015-11-27: 160 mg via INTRAVENOUS

## 2015-11-27 MED ORDER — ROCURONIUM BROMIDE 100 MG/10ML IV SOLN
INTRAVENOUS | Status: AC
  Filled 2015-11-27: qty 1

## 2015-11-27 MED ORDER — LACTATED RINGERS IV SOLN
INTRAVENOUS | Status: DC
  Administered 2015-11-27: 15:00:00 via INTRAVENOUS

## 2015-11-27 MED ORDER — HYDROMORPHONE HCL 1 MG/ML IJ SOLN
0.2500 mg | INTRAMUSCULAR | Status: DC | PRN
  Administered 2015-11-27 (×4): 0.5 mg via INTRAVENOUS

## 2015-11-27 MED ORDER — DEXAMETHASONE SODIUM PHOSPHATE 10 MG/ML IJ SOLN
INTRAMUSCULAR | Status: AC
  Filled 2015-11-27: qty 1

## 2015-11-27 MED ORDER — HYDROCODONE-ACETAMINOPHEN 5-325 MG PO TABS
1.0000 | ORAL_TABLET | ORAL | Status: DC | PRN
Start: 2015-11-27 — End: 2015-12-05
  Administered 2015-11-28: 1 via ORAL
  Administered 2015-11-28 – 2015-11-29 (×4): 2 via ORAL
  Administered 2015-11-30 – 2015-12-02 (×4): 1 via ORAL
  Administered 2015-12-02 – 2015-12-05 (×6): 2 via ORAL
  Filled 2015-11-27 (×2): qty 1
  Filled 2015-11-27 (×3): qty 2
  Filled 2015-11-27: qty 1
  Filled 2015-11-27 (×3): qty 2
  Filled 2015-11-27: qty 1
  Filled 2015-11-27: qty 2
  Filled 2015-11-27: qty 1
  Filled 2015-11-27 (×4): qty 2

## 2015-11-27 MED ORDER — SCOPOLAMINE 1 MG/3DAYS TD PT72
MEDICATED_PATCH | TRANSDERMAL | Status: AC
  Filled 2015-11-27: qty 1

## 2015-11-27 MED ORDER — LACTATED RINGERS IV SOLN
INTRAVENOUS | Status: DC | PRN
  Administered 2015-11-27: 13:00:00 via INTRAVENOUS

## 2015-11-27 MED ORDER — PROMETHAZINE HCL 25 MG/ML IJ SOLN: 6.2500 mg | INTRAMUSCULAR | Status: DC | PRN

## 2015-11-27 MED ORDER — SCOPOLAMINE 1 MG/3DAYS TD PT72
1.0000 | MEDICATED_PATCH | TRANSDERMAL | Status: DC
  Administered 2015-11-27: 1.5 mg via TRANSDERMAL

## 2015-11-27 MED ORDER — ONDANSETRON HCL 4 MG/2ML IJ SOLN
INTRAMUSCULAR | Status: AC
  Filled 2015-11-27: qty 2

## 2015-11-27 MED ORDER — FENTANYL CITRATE (PF) 100 MCG/2ML IJ SOLN
INTRAMUSCULAR | Status: DC | PRN
  Administered 2015-11-27 (×5): 50 ug via INTRAVENOUS

## 2015-11-27 MED ORDER — MIDAZOLAM HCL 2 MG/2ML IJ SOLN
INTRAMUSCULAR | Status: AC
  Filled 2015-11-27: qty 2

## 2015-11-27 MED ORDER — MEPERIDINE HCL 50 MG/ML IJ SOLN: 6.2500 mg | INTRAMUSCULAR | Status: DC | PRN

## 2015-11-27 SURGICAL SUPPLY — 35 items
BENZOIN TINCTURE PRP APPL 2/3 (GAUZE/BANDAGES/DRESSINGS) IMPLANT
BLADE HEX COATED 2.75 (ELECTRODE) ×3 IMPLANT
BLADE SURG 15 STRL LF DISP TIS (BLADE) IMPLANT
BLADE SURG 15 STRL SS (BLADE)
BLADE SURG SZ10 CARB STEEL (BLADE) ×6 IMPLANT
BNDG GAUZE ELAST 4 BULKY (GAUZE/BANDAGES/DRESSINGS) ×3 IMPLANT
CLOSURE WOUND 1/2 X4 (GAUZE/BANDAGES/DRESSINGS)
COVER SURGICAL LIGHT HANDLE (MISCELLANEOUS) ×3 IMPLANT
DECANTER SPIKE VIAL GLASS SM (MISCELLANEOUS) IMPLANT
DRAIN PENROSE 18X1/2 LTX STRL (DRAIN) IMPLANT
DRAPE LAPAROSCOPIC ABDOMINAL (DRAPES) ×3 IMPLANT
DRAPE LAPAROTOMY TRNSV 102X78 (DRAPE) IMPLANT
DRSG PAD ABDOMINAL 8X10 ST (GAUZE/BANDAGES/DRESSINGS) ×12 IMPLANT
ELECT PENCIL ROCKER SW 15FT (MISCELLANEOUS) ×3 IMPLANT
ELECT REM PT RETURN 9FT ADLT (ELECTROSURGICAL) ×3
ELECTRODE REM PT RTRN 9FT ADLT (ELECTROSURGICAL) ×1 IMPLANT
GAUZE SPONGE 4X4 12PLY STRL (GAUZE/BANDAGES/DRESSINGS) ×6 IMPLANT
GLOVE BIOGEL PI IND STRL 7.0 (GLOVE) ×1 IMPLANT
GLOVE BIOGEL PI INDICATOR 7.0 (GLOVE) ×2
GLOVE SURG SIGNA 7.5 PF LTX (GLOVE) ×3 IMPLANT
GOWN SPEC L4 XLG W/TWL (GOWN DISPOSABLE) ×3 IMPLANT
GOWN STRL REUS W/ TWL XL LVL3 (GOWN DISPOSABLE) ×3 IMPLANT
GOWN STRL REUS W/TWL LRG LVL3 (GOWN DISPOSABLE) ×3 IMPLANT
GOWN STRL REUS W/TWL XL LVL3 (GOWN DISPOSABLE) ×6
KIT BASIN OR (CUSTOM PROCEDURE TRAY) ×3 IMPLANT
NEEDLE HYPO 25X1 1.5 SAFETY (NEEDLE) IMPLANT
NS IRRIG 1000ML POUR BTL (IV SOLUTION) ×3 IMPLANT
PACK BASIC VI WITH GOWN DISP (CUSTOM PROCEDURE TRAY) IMPLANT
PACK GENERAL/GYN (CUSTOM PROCEDURE TRAY) ×3 IMPLANT
SPONGE LAP 4X18 X RAY DECT (DISPOSABLE) IMPLANT
STRIP CLOSURE SKIN 1/2X4 (GAUZE/BANDAGES/DRESSINGS) IMPLANT
SYR BULB IRRIGATION 50ML (SYRINGE) IMPLANT
SYR CONTROL 10ML LL (SYRINGE) IMPLANT
TOWEL OR 17X26 10 PK STRL BLUE (TOWEL DISPOSABLE) ×3 IMPLANT
YANKAUER SUCT BULB TIP 10FT TU (MISCELLANEOUS) ×3 IMPLANT

## 2015-11-27 NOTE — Op Note (Signed)
11/23/2015 - 11/27/2015  2:32 PM  PATIENT:  Ebony Stanley, 60 y.o., female, MRN: [id number]  PREOP DIAGNOSIS:  RIGHT AXILLA AND ABDOMINAL Wall ABCESS  POSTOP DIAGNOSIS:   RIGHT AXILLA AND ABDOMINAL Wall ABCESS  PROCEDURE:   Procedure(s): IRRIGATION AND DEBRIDEMENT RIGHT AXILLARY ABSCESS AND ABDOMINAL WALL  SURGEON:   Alphonsa Overall, M.D.  ASSISTANT:   None  ANESTHESIA:   general  Anesthesiologist: [name], MD CRNA: Talbot Grumbling, CRNA; Glory Buff, CRNA  General  EBL:  100  ml  BLOOD ADMINISTERED: none  DRAINS: Wounds backed with curlex.  LOCAL MEDICATIONS USED:   None  SPECIMEN:   Culture  COUNTS CORRECT:  YES  INDICATIONS FOR PROCEDURE:  Ebony Stanley is a 60 y.o. (DOB: 30-Jan-1956) white female whose primary care physician is LAUENSTEIN,KURT, MD.  She was admitted with a right axillary and abdominal wall inflammation.  She comes for I&D of right axillary and abdominal wall abscess.   The indications and risks of the surgery were explained to the patient.  The risks include, but are not limited to, infection, bleeding, and nerve injury.  PROCEDURE:  She was taken to the operating room #6 and underwent general anesthesia.   A time out was held and the surgical checklist run.    She was in a supine position.  Her right axilla and abdominal wall were prepped with betadine.   I made a 10 cm incision in the right axilla and a 9 cm incision in the lower abdominal wall and drained approximately 100 cc of pus from each incision.  I obtained cultures from the abdominal wound.  I then irrigated the wounds with saline.  And I packed each wound with saline damp Curlex gauze.   Each wound was then sterilely dressed.   She was transferred to the PACU in good condition.  She is already in the hospital and will be kept at least 24 hours.  Alphonsa Overall, MD, [facility] Pager: [phone] Office phone:  [phone]

## 2015-11-27 NOTE — Progress Notes (Signed)
Subjective: Sites are very tender both under the right axilla and the abdominal wall. On my exam I think both the abdominal wall and the axilla or fluctuant. Ongoing erythema with significant tenderness pain.  Abdominal wall is much the same. There is actually a small pinhole site beginning to appear along the abdominal wall.  Objective: Vital signs in last 24 hours: Temp:  [99 F (37.2 C)-99.1 F (37.3 C)] 99 F (37.2 C) (08/07 0543) Pulse Rate:  [86-87] 87 (08/07 0543) Resp:  [20] 20 (08/07 0543) BP: (89-92)/(51-52) 89/52 (08/07 0543) SpO2:  [94 %-100 %] 100 % (08/07 0543) Last BM Date: 11/26/15 (per pt) 720 PO Voided x 10 BM x 1 Afebrile, VSS   Creatinine is up slightly WBC is still elevated, but improved from admit Intake/Output from previous day: 08/06 0701 - 08/07 0700 In: 4243.3 [P.O.:720; I.V.:2323.3; IV Piggyback:1200] Out: -  Intake/Output this shift: No intake/output data recorded.  General appearance: alert, cooperative, no distress and Anxious sites are very painful. Skin: Skin color, texture, turgor normal. No rashes or lesions or Right axilla is painful and it feels fluctuant to me at this time. Marked erythema and tenderness around the site.  Lab Results:   Recent Labs  11/25/15 0452 11/26/15 0455  WBC 17.6* 17.5*  HGB 11.4* 10.9*  HCT 32.4* 32.3*  PLT 487* 475*    BMET  Recent Labs  11/26/15 0455 11/27/15 0449  NA 140 134*  K 5.3* 3.6  CL 109 103  CO2 26 25  GLUCOSE 109* 106*  BUN 10 9  CREATININE 1.11* 1.10*  CALCIUM 7.6* 7.1*   PT/INR No results for input(s): LABPROT, INR in the last 72 hours.  No results for input(s): AST, ALT, ALKPHOS, BILITOT, PROT, ALBUMIN in the last 168 hours.   Lipase     Component Value Date/Time   LIPASE 22 09/16/2014 0515     Studies/Results: No results found.  Medications: . acetaminophen  1,000 mg Oral TID  . buPROPion  150 mg Oral Daily  . celecoxib  200 mg Oral QHS  . Chlorhexidine  Gluconate Cloth  6 each Topical Q0600  . enoxaparin (LOVENOX) injection  40 mg Subcutaneous Q24H  . estradiol  1 mg Oral QHS  . feeding supplement (ENSURE ENLIVE)  237 mL Oral Q24H  . feeding supplement (PRO-STAT SUGAR FREE 64)  30 mL Oral Daily  . lip balm  1 application Topical BID  . multivitamin with minerals  1 tablet Oral Daily  . mupirocin ointment  1 application Nasal BID  . progesterone  100 mg Oral QHS  . senna  1 tablet Oral BID  . vancomycin  1,000 mg Intravenous Q12H  . vitamin C  500 mg Oral BID   . sodium chloride 100 mL/hr at 11/26/15 1730    Body mass index is 34.96 kg/m.  DEPRESSION  Assessment/Plan Right axillary cellulitis/Abdominal wall cellulitis  MRSA POSITIVE Sepsis - failed OP oral antibiotics (BLOOD CULTURES PENDING) S/p I&D  vulvula & axilla -  11/16/15 Dr. Philis Pique Fen:  NPO currrently ID:  Day 5 IV antibiotics - currently just on vancomycin DVT:  Lovenox   Plan: She is nothing by mouth. Sites are extremely tender. I think they may be amenable to incision and drainage. Will discuss with Dr. Lucia Gaskins. Keep her nothing by mouth for now. Going to OR this afternoon for I&D of sites.  Dr. Lucia Gaskins has seen her and has explained the procedure.        LOS:  3 days   JENNINGS,WILLARD 11/27/2015 [phone]  Agree with above. Both right axilla and abdominal areas seem fluctuant - will plan I&D.  Discussed with patient. Her husband lives full time in [place] because of his job. Her daughter, 36, lives at home.  Alphonsa Overall, MD, [facility] Pager: [phone] Office phone:  [phone]

## 2015-11-27 NOTE — Anesthesia Procedure Notes (Signed)
Procedure Name: LMA Insertion Date/Time: 11/27/2015 1:54 PM Performed by: Talbot Grumbling Pre-anesthesia Checklist: Patient identified, Emergency Drugs available, Patient being monitored and Suction available Patient Re-evaluated:Patient Re-evaluated prior to inductionOxygen Delivery Method: Circle system utilized Preoxygenation: Pre-oxygenation with 100% oxygen Intubation Type: IV induction Ventilation: Mask ventilation without difficulty LMA Size: 4.0 Number of attempts: 1 Placement Confirmation: positive ETCO2 and breath sounds checked- equal and bilateral Tube secured with: Tape Dental Injury: Teeth and Oropharynx as per pre-operative assessment

## 2015-11-27 NOTE — Anesthesia Postprocedure Evaluation (Signed)
Anesthesia Post Note  Patient: Ebony Stanley  Procedure(s) Performed: Procedure(s) (LRB): IRRIGATION AND DEBRIDEMENT RIGHT AXILLARY ABSCESS AND ABDOMINAL WALL (Right)  Patient location during evaluation: PACU Anesthesia Type: General Level of consciousness: awake and alert Pain management: pain level controlled Vital Signs Assessment: post-procedure vital signs reviewed and stable Respiratory status: spontaneous breathing, nonlabored ventilation, respiratory function stable and patient connected to nasal cannula oxygen Cardiovascular status: blood pressure returned to baseline and stable Postop Assessment: no signs of nausea or vomiting Anesthetic complications: no    Last Vitals:  Vitals:   11/27/15 1515 11/27/15 1530  BP: 120/80 127/71  Pulse: 100 95  Resp: (!) 22 16  Temp:  36.9 C    Last Pain:  Vitals:   11/27/15 1530  TempSrc:   PainSc: 2                  [name]

## 2015-11-27 NOTE — Anesthesia Preprocedure Evaluation (Deleted)
Anesthesia Evaluation  Patient identified by MRN, date of birth, ID band Patient awake    Reviewed: Allergy & Precautions, NPO status , Patient's Chart, lab work & pertinent test results  Airway        Dental   Pulmonary asthma ,           Cardiovascular negative cardio ROS       Neuro/Psych PSYCHIATRIC DISORDERS Anxiety Depression negative neurological ROS     GI/Hepatic negative GI ROS, Neg liver ROS,   Endo/Other  negative endocrine ROS  Renal/GU negative Renal ROS  negative genitourinary   Musculoskeletal  (+) Arthritis ,   Abdominal   Peds negative pediatric ROS (+)  Hematology negative hematology ROS (+)   Anesthesia Other Findings   Reproductive/Obstetrics negative OB ROS                             Lab Results  Component Value Date   WBC 17.5 (H) 11/26/2015   HGB 10.9 (L) 11/26/2015   HCT 32.3 (L) 11/26/2015   MCV 91.5 11/26/2015   PLT 475 (H) 11/26/2015   Lab Results  Component Value Date   CREATININE 1.10 (H) 11/27/2015   BUN 9 11/27/2015   NA 134 (L) 11/27/2015   K 3.6 11/27/2015   CL 103 11/27/2015   CO2 25 11/27/2015   No results found for: INR, PROTIME  06/2015 EKG: atrial flutter.   Anesthesia Physical Anesthesia Plan  ASA: III  Anesthesia Plan: General   Post-op Pain Management:    Induction: Intravenous  Airway Management Planned: LMA  Additional Equipment:   Intra-op Plan:   Post-operative Plan: Extubation in OR  Informed Consent: I have reviewed the patients History and Physical, chart, labs and discussed the procedure including the risks, benefits and alternatives for the proposed anesthesia with the patient or authorized representative who has indicated his/her understanding and acceptance.   Dental advisory given  Plan Discussed with: CRNA  Anesthesia Plan Comments:         Anesthesia Quick Evaluation

## 2015-11-27 NOTE — Transfer of Care (Signed)
Immediate Anesthesia Transfer of Care Note  Patient: Ebony Stanley  Procedure(s) Performed: Procedure(s): IRRIGATION AND DEBRIDEMENT RIGHT AXILLARY ABSCESS AND ABDOMINAL WALL (Right)  Patient Location: PACU  Anesthesia Type:MAC  Level of Consciousness: awake, alert  and oriented  Airway & Oxygen Therapy: Patient Spontanous Breathing and Patient connected to face mask oxygen  Post-op Assessment: Report given to RN and Post -op Vital signs reviewed and stable  Post vital signs: Reviewed and stable  Last Vitals:  Vitals:   11/27/15 0543 11/27/15 1258  BP: (!) 89/52 138/77  Pulse: 87 (!) 103  Resp: 20 18  Temp: 37.2 C 37.4 C    Last Pain:  Vitals:   11/27/15 1258  TempSrc: Oral  PainSc:       Patients Stated Pain Goal: 2 ([id number])  Complications: No apparent anesthesia complications

## 2015-11-27 NOTE — Anesthesia Preprocedure Evaluation (Addendum)
Anesthesia Evaluation  Patient identified by MRN, date of birth, ID band Patient awake    Reviewed: Allergy & Precautions, NPO status , Patient's Chart, lab work & pertinent test results  History of Anesthesia Complications (+) PONV and history of anesthetic complications  Airway Mallampati: II  TM Distance: >3 FB Neck ROM: Full    Dental  (+) Teeth Intact, Dental Advisory Given   Pulmonary asthma ,    Pulmonary exam normal breath sounds clear to auscultation       Cardiovascular negative cardio ROS Normal cardiovascular exam Rhythm:Regular Rate:Normal     Neuro/Psych PSYCHIATRIC DISORDERS Anxiety Depression negative neurological ROS     GI/Hepatic negative GI ROS, Neg liver ROS,   Endo/Other  negative endocrine ROS  Renal/GU negative Renal ROS  negative genitourinary   Musculoskeletal  (+) Arthritis , Osteoarthritis,    Abdominal   Peds negative pediatric ROS (+)  Hematology negative hematology ROS (+)   Anesthesia Other Findings Day of surgery medications reviewed with the patient.  Reproductive/Obstetrics negative OB ROS                            Anesthesia Physical Anesthesia Plan  ASA: III  Anesthesia Plan: General   Post-op Pain Management:    Induction: Intravenous  Airway Management Planned: LMA  Additional Equipment:   Intra-op Plan:   Post-operative Plan: Extubation in OR  Informed Consent: I have reviewed the patients History and Physical, chart, labs and discussed the procedure including the risks, benefits and alternatives for the proposed anesthesia with the patient or authorized representative who has indicated his/her understanding and acceptance.   Dental advisory given  Plan Discussed with: CRNA  Anesthesia Plan Comments:         Anesthesia Quick Evaluation

## 2015-11-27 NOTE — Progress Notes (Signed)
[facility] for Infectious Disease    Date of Admission:  11/23/2015   Total days of antibiotics 5        Day 5 vanco   ID: Ebony Stanley is a 60 y.o. female with multiple MRSA absess Principal Problem:   Cellulitis of right axilla Active Problems:   Depression   Abscess of vulva s/p I&D   Cellulitis of abdominal wall   Obesity (BMI 30-39.9)   Hypokalemia   Leukocytosis   MRSA (methicillin resistant staph aureus) culture positive   Cellulitis   Sepsis ([place])    Subjective: Afebrile, went to the OR for debridement of axilla and abdomen wall lesion. 168mL purulence I x D from each site. Patient feeling slightly anxious since being back from the OR. Denies need for addn pain meds at this time.  RN reports that both abdominal and right axilla erythema had improved  Medications:  . [MAR Hold] acetaminophen  1,000 mg Oral TID  . [MAR Hold] buPROPion  150 mg Oral Daily  . [MAR Hold] celecoxib  200 mg Oral QHS  . [MAR Hold] Chlorhexidine Gluconate Cloth  6 each Topical Q0600  . [MAR Hold] enoxaparin (LOVENOX) injection  40 mg Subcutaneous Q24H  . [MAR Hold] estradiol  1 mg Oral QHS  . [MAR Hold] feeding supplement (ENSURE ENLIVE)  237 mL Oral Q24H  . [MAR Hold] feeding supplement (PRO-STAT SUGAR FREE 64)  30 mL Oral Daily  . [MAR Hold] lip balm  1 application Topical BID  . [MAR Hold] multivitamin with minerals  1 tablet Oral Daily  . [MAR Hold] mupirocin ointment  1 application Nasal BID  . [MAR Hold] progesterone  100 mg Oral QHS  . scopolamine  1 patch Transdermal Q72H  . [MAR Hold] senna  1 tablet Oral BID  . [MAR Hold] vancomycin  1,000 mg Intravenous Q12H  . [MAR Hold] vitamin C  500 mg Oral BID    Objective: Vital signs in last 24 hours: Temp:  [98.7 F (37.1 C)-99.3 F (37.4 C)] 98.7 F (37.1 C) (08/07 1440) Pulse Rate:  [87-103] 100 (08/07 1515) Resp:  [14-22] 22 (08/07 1515) BP: (89-145)/(52-84) 120/80 (08/07 1515) SpO2:  [98 %-100 %] 98 % (08/07  1515) Physical Exam  Constitutional:  oriented to person, place, and time. appears well-developed and well-nourished. No distress.  HENT: [place]/AT, PERRLA, no scleral icterus Mouth/Throat: Oropharynx is clear and moist. No oropharyngeal exudate.  Cardiovascular: Normal rate, regular rhythm and normal heart sounds. Exam reveals no gallop and no friction rub.  No murmur heard.  Pulmonary/Chest: Effort normal and breath sounds normal. No respiratory distress.  has no wheezes.  Axilla = right axilla bandaged from I x D Neck = supple, no nuchal rigidity Abdominal: Soft. Bowel sounds are normal.  exhibits no distension. Mild tenderness near lower abdomen. Bandaged at lower aspect of abd from IX D Lymphadenopathy: no cervical adenopathy. No axillary adenopathy Neurological: alert and oriented to person, place, and time.  Skin: Skin is warm and dry. No rash noted. No erythema.  Psychiatric: a normal mood and affect.  behavior is normal.    Lab Results  Recent Labs  11/25/15 0452 11/26/15 0455 11/27/15 0449  WBC 17.6* 17.5*  --   HGB 11.4* 10.9*  --   HCT 32.4* 32.3*  --   NA 136 140 134*  K 2.9* 5.3* 3.6  CL 100* 109 103  CO2 26 26 25   BUN 12 10 9   CREATININE 1.14* 1.11* 1.10*  Microbiology: 8/7 OR wound cx 8/4 blood cx NGTD Studies/Results: No results found.   Assessment/Plan:  - has source control now that she has undergone I x D. Continue on IV vancomycin for now. Will follow up on OR cultures.  - if cx still c/w MRSA, can switch to oral doxycycline 100mg  BID once ready for discharge - for discharge, would recommend to continue with decolonization with mupirocin and chlorhexedine body wash x 10d  Therapeutic drug monitoring = Currently therapeutic   Baxter Flattery [facility] Cell: [phone] Pager: [phone]  11/27/2015, 3:34 PM

## 2015-11-27 NOTE — Progress Notes (Signed)
PROGRESS NOTE  Ebony Stanley  [id number] DOB: March 26, 1956 DOA: 11/23/2015 PCP: Robyn Haber, MD Outpatient Specialists:  Subjective: Fever curve is going down, continue current antibiotics. Still very tender, general surgery to evaluate for possible I&D today.  Brief Narrative:  60 year old with past medical history of obesity, MRSA cellulitis came in with recurrent valvular abscess, right axillary abscess and suprapubic, ID to evaluate.  Assessment & Plan:   Principal Problem:   Cellulitis of right axilla Active Problems:   Depression   Abscess of vulva s/p I&D   Cellulitis of abdominal wall   Obesity (BMI 30-39.9)   Hypokalemia   Leukocytosis   MRSA (methicillin resistant staph aureus) culture positive   Cellulitis   Sepsis ([place])   Sepsis -Present on admission, Temperature of 102.1, WBCs of 25.5 on admission and presence of infection. -Initial lactic acid was 1.0, second check was 2.4, procalcitonin 0.27. -This is secondary to multiple areas of cellulitis, follow blood cultures. Continue broad-spectrum antibiotics. -Continue to spike fevers, but it does not have obvious drainable abscess.  Cellulitis -Patient with multiple areas of cellulitis (under right arm and suprapubic area) with possible developing abscess.   -Groin/labial abscess already drained and MRSA positive -Presume that this is a diffuse MRSA infection (HIV negative) -Blood cultures obtained, cannot rule out transient bacteremia as cause of the multiple cellulitis. -Failed outpatient oral antibiotics, initially was on Zosyn and vancomycin, switched to vancomycin. -Follow clinically, might undergo I&D today or tomorrow.  Abscess of the vulva -Has 3 different abscesses in her vulva, this was seated and drained by OB/GYN. -Grew MRSA.  Hypokalemia -Severe hypokalemia with potassium of 2.9, this is repleted with oral supplements.  Depression -Continue Wellbutrin   DVT prophylaxis: Subcutaneous  heparin Code Status: Full Code Family Communication:  Disposition Plan:  Diet: Diet NPO time specified  Consultants:   Gen. surgery.  Infectious disease.  Procedures:   None  Antimicrobials:   Zosyn and clindamycin  Objective: Vitals:   11/25/15 2215 11/26/15 0525 11/26/15 1406 11/27/15 0543  BP: 106/69 112/63 (!) 92/51 (!) 89/52  Pulse: (!) 101 100 86 87  Resp: 16 16 20 20   Temp: 99.3 F (37.4 C) 99.6 F (37.6 C) 99.1 F (37.3 C) 99 F (37.2 C)  TempSrc: Oral Oral Oral Oral  SpO2: 100% 97% 94% 100%  Weight:      Height:        Intake/Output Summary (Last 24 hours) at 11/27/15 1155 Last data filed at 11/27/15 0700  Gross per 24 hour  Intake          4243.33 ml  Output                0 ml  Net          4243.33 ml   Filed Weights   11/23/15 2245  Weight: 81.2 kg (179 lb)    Examination: General exam: Appears calm and comfortable  Respiratory system: Clear to auscultation. Respiratory effort normal. Cardiovascular system: S1 & S2 heard, RRR. No JVD, murmurs, rubs, gallops or clicks. No pedal edema. Gastrointestinal system: Abdomen is nondistended, soft and nontender. No organomegaly or masses felt. Normal bowel sounds heard. Central nervous system: Alert and oriented. No focal neurological deficits. Extremities: Symmetric 5 x 5 power. Skin: No rashes, lesions or ulcers Psychiatry: Judgement and insight appear normal. Mood & affect appropriate.   Data Reviewed: I have personally reviewed following labs and imaging studies  CBC:  Recent Labs Lab 11/20/15 1713 11/23/15 1950 11/24/15  [id number] 11/25/15 0452 11/26/15 0455  WBC 17.8* 26.9* 25.5* 17.6* 17.5*  NEUTROABS 14.0* 22.6*  --   --   --   HGB 14.0 12.4 12.0 11.4* 10.9*  HCT 38.9 35.5* 33.7* 32.4* 32.3*  MCV 87.8 89.0 88.0 89.3 91.5  PLT 422* 461* 498* 487* [id number]*   Basic Metabolic Panel:  Recent Labs Lab 11/23/15 1950 11/24/15 0219 11/25/15 0452 11/26/15 0455 11/27/15 0449  NA 135 132* 136  140 134*  K 3.1* 3.0* 2.9* 5.3* 3.6  CL 100* 98* 100* 109 103  CO2 24 22 26 26 25   GLUCOSE 93 111* 108* 109* 106*  BUN 13 11 12 10 9   CREATININE 0.90 0.90 1.14* 1.11* 1.10*  CALCIUM 9.0 8.5* 8.0* 7.6* 7.1*  MG  --   --  2.2  --   --    GFR: Estimated Creatinine Clearance: 52 mL/min (by C-G formula based on SCr of 1.1 mg/dL). Liver Function Tests: No results for input(s): AST, ALT, ALKPHOS, BILITOT, PROT, ALBUMIN in the last 168 hours. No results for input(s): LIPASE, AMYLASE in the last 168 hours. No results for input(s): AMMONIA in the last 168 hours. Coagulation Profile: No results for input(s): INR, PROTIME in the last 168 hours. Cardiac Enzymes: No results for input(s): CKTOTAL, CKMB, CKMBINDEX, TROPONINI in the last 168 hours. BNP (last 3 results) No results for input(s): PROBNP in the last 8760 hours. HbA1C: No results for input(s): HGBA1C in the last 72 hours. CBG: No results for input(s): GLUCAP in the last 168 hours. Lipid Profile: No results for input(s): CHOL, HDL, LDLCALC, TRIG, CHOLHDL, LDLDIRECT in the last 72 hours. Thyroid Function Tests: No results for input(s): TSH, T4TOTAL, FREET4, T3FREE, THYROIDAB in the last 72 hours. Anemia Panel: No results for input(s): VITAMINB12, FOLATE, FERRITIN, TIBC, IRON, RETICCTPCT in the last 72 hours. Urine analysis:    Component Value Date/Time   COLORURINE YELLOW 09/16/2014 0714   APPEARANCEUR CLOUDY (A) 09/16/2014 0714   LABSPEC 1.024 09/16/2014 0714   PHURINE 6.0 09/16/2014 0714   GLUCOSEU NEGATIVE 09/16/2014 0714   HGBUR LARGE (A) 09/16/2014 0714   BILIRUBINUR NEGATIVE 09/16/2014 0714   BILIRUBINUR n 11/30/2012 1256   KETONESUR 40 (A) 09/16/2014 0714   PROTEINUR NEGATIVE 09/16/2014 0714   UROBILINOGEN 0.2 09/16/2014 0714   NITRITE NEGATIVE 09/16/2014 0714   LEUKOCYTESUR NEGATIVE 09/16/2014 0714   Sepsis Labs: @LABRCNTIP (procalcitonin:4,lacticidven:4)  ) Recent Results (from the past 240 hour(s))    Aerobic/Anaerobic Culture (surgical/deep wound)     Status: None   Collection Time: 11/20/15  6:53 PM  Result Value Ref Range Status   Specimen Description ABSCESS  Final   Special Requests VULVA LESIONS  Final   Gram Stain   Final    ABUNDANT WBC PRESENT, PREDOMINANTLY PMN RARE GRAM POSITIVE COCCI IN PAIRS    Culture   Final    FEW METHICILLIN RESISTANT STAPHYLOCOCCUS AUREUS NO ANAEROBES ISOLATED Performed at [facility]    Report Status 11/26/2015 FINAL  Final   Organism ID, Bacteria METHICILLIN RESISTANT STAPHYLOCOCCUS AUREUS  Final      Susceptibility   Methicillin resistant staphylococcus aureus - MIC*    CIPROFLOXACIN >=8 RESISTANT Resistant     ERYTHROMYCIN >=8 RESISTANT Resistant     GENTAMICIN <=0.5 SENSITIVE Sensitive     OXACILLIN >=4 RESISTANT Resistant     TETRACYCLINE <=1 SENSITIVE Sensitive     VANCOMYCIN <=0.5 SENSITIVE Sensitive     TRIMETH/SULFA <=10 SENSITIVE Sensitive     CLINDAMYCIN <=0.25 SENSITIVE  Sensitive     RIFAMPIN <=0.5 SENSITIVE Sensitive     Inducible Clindamycin NEGATIVE Sensitive     * FEW METHICILLIN RESISTANT STAPHYLOCOCCUS AUREUS  Culture, blood (routine x 2)     Status: None (Preliminary result)   Collection Time: 11/23/15 10:33 PM  Result Value Ref Range Status   Specimen Description BLOOD LEFT HAND  Final   Special Requests BOTTLES DRAWN AEROBIC AND ANAEROBIC 3CC EACH  Final   Culture   Final    NO GROWTH 2 DAYS Performed at [facility]    Report Status PENDING  Incomplete  Culture, blood (routine x 2)     Status: None (Preliminary result)   Collection Time: 11/24/15  2:00 AM  Result Value Ref Range Status   Specimen Description BLOOD LEFT ARM  Final   Special Requests   Final    BOTTLES DRAWN AEROBIC AND ANAEROBIC 9 CC BOTH BOTTLES   Culture   Final    NO GROWTH 2 DAYS Performed at [facility]    Report Status PENDING  Incomplete     Invalid input(s): PROCALCITONIN, LACTICACIDVEN   Radiology  Studies: No results found.      Scheduled Meds: . acetaminophen  1,000 mg Oral TID  . buPROPion  150 mg Oral Daily  . celecoxib  200 mg Oral QHS  . Chlorhexidine Gluconate Cloth  6 each Topical Q0600  . enoxaparin (LOVENOX) injection  40 mg Subcutaneous Q24H  . estradiol  1 mg Oral QHS  . feeding supplement (ENSURE ENLIVE)  237 mL Oral Q24H  . feeding supplement (PRO-STAT SUGAR FREE 64)  30 mL Oral Daily  . lip balm  1 application Topical BID  . multivitamin with minerals  1 tablet Oral Daily  . mupirocin ointment  1 application Nasal BID  . progesterone  100 mg Oral QHS  . senna  1 tablet Oral BID  . vancomycin  1,000 mg Intravenous Q12H  . vitamin C  500 mg Oral BID   Continuous Infusions: . sodium chloride 100 mL/hr at 11/27/15 0858     LOS: 3 days    Time spent: 35 minutes    Ebony Stanley A, MD [facility] Pager [phone]  If 7PM-7AM, please contact night-coverage [email or web address] Password [email or web address] 11/27/2015, 11:55 AM

## 2015-11-28 LAB — CBC
HEMATOCRIT: 28.9 % — AB (ref 36.0–46.0)
Hemoglobin: 9.8 g/dL — ABNORMAL LOW (ref 12.0–15.0)
MCH: 31.5 pg (ref 26.0–34.0)
MCHC: 33.9 g/dL (ref 30.0–36.0)
MCV: 92.9 fL (ref 78.0–100.0)
Platelets: 462 10*3/uL — ABNORMAL HIGH (ref 150–400)
RBC: 3.11 MIL/uL — AB (ref 3.87–5.11)
RDW: 15.3 % (ref 11.5–15.5)
WBC: 15 10*3/uL — AB (ref 4.0–10.5)

## 2015-11-28 LAB — BASIC METABOLIC PANEL
Anion gap: 5 (ref 5–15)
BUN: 11 mg/dL (ref 6–20)
CHLORIDE: 105 mmol/L (ref 101–111)
CO2: 27 mmol/L (ref 22–32)
Calcium: 7.3 mg/dL — ABNORMAL LOW (ref 8.9–10.3)
Creatinine, Ser: 1.09 mg/dL — ABNORMAL HIGH (ref 0.44–1.00)
GFR calc non Af Amer: 54 mL/min — ABNORMAL LOW (ref >60)
Glucose, Bld: 94 mg/dL (ref 65–99)
POTASSIUM: 4.1 mmol/L (ref 3.5–5.1)
SODIUM: 137 mmol/L (ref 135–145)

## 2015-11-28 LAB — LACTIC ACID, PLASMA: Lactic Acid, Venous: 0.8 mmol/L (ref 0.5–1.9)

## 2015-11-28 MED ORDER — DIPHENHYDRAMINE HCL 12.5 MG/5ML PO ELIX
12.5000 mg | ORAL_SOLUTION | Freq: Four times a day (QID) | ORAL | Status: DC | PRN
  Administered 2015-12-03: 25 mg via ORAL
  Filled 2015-11-28: qty 10

## 2015-11-28 NOTE — Progress Notes (Signed)
PHARMACIST - PHYSICIAN COMMUNICATION  Key Points: Use following P&T approved IV to PO diphenhydramine (Benadryl) policy. Description contains the criteria that are approved  DR: Hartford Poli CONCERNING: IV to Oral Route Change Policy  RECOMMENDATION: This patient is receiving diphenhydramine by the intravenous route.  Based on criteria approved by the Pharmacy and Therapeutics Committee, intravenous diphenhydramine is being converted to the equivalent oral dose form(s).  Diphenhydramine 12.5-25 mg PO q6h PRN   DESCRIPTION: These criteria include: Diphenhydramine is not prescribed to treat or prevent a severe allergic reaction  Diphenhydramine is not prescribed as premedication prior to receiving blood product, biologic medication, antimicrobial, or chemotherapy agent The patient has tolerated at least one dose of an oral or enteral medication The patient has no evidence of active gastrointestinal bleeding or impaired GI absorption (gastrectomy, short bowel, patient on TNA or NPO). The patient is not undergoing procedural sedation   If you have questions about this conversion, please contact the Pharmacy Department    [phone] )  [facility]   [phone] )  [facility]   [phone] )  [facility]   [phone] )  [facility]   [phone] )  [facility]   Ralene Bathe, PharmD, BCPS 11/28/2015, 10:16 AM  Pager: [phone]

## 2015-11-28 NOTE — Care Management Note (Signed)
Case Management Note  Patient Details  Name: Ebony Stanley MRN: [id number] Date of Birth: February 19, 1956  Subjective/Objective:                    Action/Plan:   Expected Discharge Date:   (unknown)               Expected Discharge Plan:  [place]  In-House Referral:     Discharge planning Services  CM Consult  Post Acute Care Choice:  Home Health Choice offered to:  Patient  DME Arranged:  N/A DME Agency:  NA  HH Arranged:  RN [place] Agency:  [place]  Status of Service:  Completed, signed off  If discussed at [place] of Stay Meetings, dates discussed:    Additional Comments: CM met with pt for choice of home health agency.  Pt chooses [facility] to render [facility] for wound management.  Pt states daughter, Cyril Mourning [phone] will be available to teach for wound care.  Referral called to [facility] rep, Manuela Schwartz.  No other CM needs were communicated. Dellie Catholic, RN 11/28/2015, 12:39 PM

## 2015-11-28 NOTE — Progress Notes (Addendum)
1 Day Post-Op  Subjective: She is tearful after dressing change and it is draining allot per the RN.  I told them to redress more frequently if needed. Both sites still red and tender.    Objective: Vital signs in last 24 hours: Temp:  [98.5 F (36.9 C)-99.7 F (37.6 C)] 99.1 F (37.3 C) (08/08 0939) Pulse Rate:  [89-103] 99 (08/08 0939) Resp:  [14-22] 16 (08/08 0939) BP: (99-145)/(56-84) 113/65 (08/08 0939) SpO2:  [93 %-100 %] 95 % (08/08 0939) Last BM Date: 11/27/15 840 PO 1700 urine Afebrile, VSS WBC is 15K   Intake/Output from previous day: 08/07 0701 - 08/08 0700 In: 3353.3 [P.O.:840; I.V.:2113.3; IV Piggyback:400] Out: 1800 [Urine:1700; Blood:100] Intake/Output this shift: Total I/O In: 480 [P.O.:480] Out: 350 [Urine:350]  General appearance: alert, cooperative, mild distress and from pain after dressing change. Skin: Skin color, texture, turgor normal. No rashes or lesions or both sites still have erythema and are very tender.  Draining allot fromt the sites.  Dressing just changed I will look at base tomorrow.  Lab Results:   Recent Labs  11/26/15 0455 11/28/15 0501  WBC 17.5* 15.0*  HGB 10.9* 9.8*  HCT 32.3* 28.9*  PLT 475* 462*    BMET  Recent Labs  11/27/15 0449 11/28/15 0501  NA 134* 137  K 3.6 4.1  CL 103 105  CO2 25 27  GLUCOSE 106* 94  BUN 9 11  CREATININE 1.10* 1.09*  CALCIUM 7.1* 7.3*   PT/INR No results for input(s): LABPROT, INR in the last 72 hours.  No results for input(s): AST, ALT, ALKPHOS, BILITOT, PROT, ALBUMIN in the last 168 hours.   Lipase     Component Value Date/Time   LIPASE 22 09/16/2014 0515     Studies/Results: No results found.  Medications: . acetaminophen  1,000 mg Oral TID  . buPROPion  150 mg Oral Daily  . celecoxib  200 mg Oral QHS  . enoxaparin (LOVENOX) injection  40 mg Subcutaneous Q24H  . estradiol  1 mg Oral QHS  . feeding supplement (ENSURE ENLIVE)  237 mL Oral Q24H  . feeding supplement  (PRO-STAT SUGAR FREE 64)  30 mL Oral Daily  . lip balm  1 application Topical BID  . multivitamin with minerals  1 tablet Oral Daily  . progesterone  100 mg Oral QHS  . senna  1 tablet Oral BID  . vancomycin  1,000 mg Intravenous Q12H  . vitamin C  500 mg Oral BID   . sodium chloride 0.9 % 1,000 mL with potassium chloride 20 mEq infusion 100 mL/hr at 11/28/15 0252    Assessment/Plan RIGHT AXILLA AND ABDOMINAL Wall ABCESS,  IRRIGATION AND DEBRIDEMENT RIGHT AXILLARY ABSCESS AND ABDOMINAL WALL,11/27/15, Dr. Lucia Gaskins Sepsis - failed OP oral antibiotics (BLOOD CULTURES PENDING) S/p I&D  vulvula & axilla -  11/16/15 Dr. Philis Pique MRSA- from [place] lesion 11/20/15 Fen:  on reg diet ID:  Day 6 IV antibiotics - currently just on vancomycin only DVT:  Lovenox   Plan:  Continue antibiotics, dressing changes, change dressing more frequently if excess drainage.    LOS: 4 days    JENNINGS,WILLARD 11/28/2015 [phone]  Agree with above. These abscesses were larger than expected - for her to take a while is not surprising. The dressing are okay - I did not take the dressing down again tonight. Husband at bedside.  Alphonsa Overall, MD, [facility] Surgery Pager: [phone] Office phone:  [phone]

## 2015-11-28 NOTE — Progress Notes (Signed)
PROGRESS NOTE  Ebony Stanley  [id number] DOB: 1956/03/19 DOA: 11/23/2015 PCP: No primary care provider on file. Outpatient Specialists:  Subjective: Had I&D done yesterday, still have some pain but overall feel better.  Brief Narrative:  60 year old with past medical history of obesity, MRSA cellulitis came in with recurrent valvular abscess, right axillary abscess and suprapubic, ID to evaluate.  Assessment & Plan:   Principal Problem:   Cellulitis of right axilla Active Problems:   Depression   Abscess of vulva s/p I&D   Cellulitis of abdominal wall   Obesity (BMI 30-39.9)   Hypokalemia   Leukocytosis   MRSA (methicillin resistant staph aureus) culture positive   Cellulitis   Sepsis ([place])   Sepsis -Present on admission, Temperature of 102.1, WBCs of 25.5 on admission and presence of infection. -Initial lactic acid was 1.0, second check was 2.4, procalcitonin 0.27. -This is secondary to multiple areas of cellulitis, follow blood cultures. Continue broad-spectrum antibiotics. -Continue to spike fevers, but it does not have obvious drainable abscess.  Cellulitis, MRSA -Patient with multiple areas of cellulitis (under right arm and suprapubic area) with possible developing abscess.   -Groin/labial abscess already drained and MRSA positive -Presume that this is a diffuse MRSA infection (HIV negative) -Blood cultures obtained, cannot rule out transient bacteremia as cause of the multiple cellulitis. -Failed outpatient oral antibiotics, initially was on Zosyn and vancomycin, switched to vancomycin. -4 times a day continue current antibiotics, maybe in 1-2 days can be discharged on oral antibiotics, ID please advise.  Abscess of the vulva -Has 3 different abscesses in her vulva, this was seated and drained by OB/GYN. -Grew MRSA.  Hypokalemia -Severe hypokalemia with potassium of 2.9, this is repleted with oral supplements.  Depression -Continue Wellbutrin   DVT  prophylaxis: Subcutaneous heparin Code Status: Full Code Family Communication:  Disposition Plan: 1-2 days, home on oral antibiotics. Diet: Diet regular Room service appropriate? Yes; Fluid consistency: Thin  Consultants:   Gen. surgery.  Infectious disease.  Procedures:   None  Antimicrobials:   Vancomycin  Objective: Vitals:   11/27/15 2228 11/28/15 0100 11/28/15 0646 11/28/15 0939  BP: (!) 102/58 (!) 99/56 117/60 113/65  Pulse: 95  89 99  Resp: 16 16 16 16   Temp: 99.7 F (37.6 C) 99 F (37.2 C) 99.5 F (37.5 C) 99.1 F (37.3 C)  TempSrc: Oral Oral Oral Oral  SpO2: 98% 100% 95% 95%  Weight:      Height:        Intake/Output Summary (Last 24 hours) at 11/28/15 1226 Last data filed at 11/28/15 1000  Gross per 24 hour  Intake          3593.33 ml  Output             2150 ml  Net          1443.33 ml   Filed Weights   11/23/15 2245  Weight: 81.2 kg (179 lb)    Examination: General exam: Appears calm and comfortable  Respiratory system: Clear to auscultation. Respiratory effort normal. Cardiovascular system: S1 & S2 heard, RRR. No JVD, murmurs, rubs, gallops or clicks. No pedal edema. Gastrointestinal system: Abdomen is nondistended, soft and nontender. No organomegaly or masses felt. Normal bowel sounds heard. Central nervous system: Alert and oriented. No focal neurological deficits. Extremities: Symmetric 5 x 5 power. Skin: No rashes, lesions or ulcers Psychiatry: Judgement and insight appear normal. Mood & affect appropriate.   Data Reviewed: I have personally reviewed following labs  and imaging studies  CBC:  Recent Labs Lab 11/23/15 1950 11/24/15 0219 11/25/15 0452 11/26/15 0455 11/28/15 0501  WBC 26.9* 25.5* 17.6* 17.5* 15.0*  NEUTROABS 22.6*  --   --   --   --   HGB 12.4 12.0 11.4* 10.9* 9.8*  HCT 35.5* 33.7* 32.4* 32.3* 28.9*  MCV 89.0 88.0 89.3 91.5 92.9  PLT 461* 498* 487* 475* [id number]*   Basic Metabolic Panel:  Recent Labs Lab  11/24/15 0219 11/25/15 0452 11/26/15 0455 11/27/15 0449 11/28/15 0501  NA 132* 136 140 134* 137  K 3.0* 2.9* 5.3* 3.6 4.1  CL 98* 100* 109 103 105  CO2 22 26 26 25 27   GLUCOSE 111* 108* 109* 106* 94  BUN 11 12 10 9 11   CREATININE 0.90 1.14* 1.11* 1.10* 1.09*  CALCIUM 8.5* 8.0* 7.6* 7.1* 7.3*  MG  --  2.2  --   --   --    GFR: Estimated Creatinine Clearance: 52.5 mL/min (by C-G formula based on SCr of 1.09 mg/dL). Liver Function Tests: No results for input(s): AST, ALT, ALKPHOS, BILITOT, PROT, ALBUMIN in the last 168 hours. No results for input(s): LIPASE, AMYLASE in the last 168 hours. No results for input(s): AMMONIA in the last 168 hours. Coagulation Profile: No results for input(s): INR, PROTIME in the last 168 hours. Cardiac Enzymes: No results for input(s): CKTOTAL, CKMB, CKMBINDEX, TROPONINI in the last 168 hours. BNP (last 3 results) No results for input(s): PROBNP in the last 8760 hours. HbA1C: No results for input(s): HGBA1C in the last 72 hours. CBG: No results for input(s): GLUCAP in the last 168 hours. Lipid Profile: No results for input(s): CHOL, HDL, LDLCALC, TRIG, CHOLHDL, LDLDIRECT in the last 72 hours. Thyroid Function Tests: No results for input(s): TSH, T4TOTAL, FREET4, T3FREE, THYROIDAB in the last 72 hours. Anemia Panel: No results for input(s): VITAMINB12, FOLATE, FERRITIN, TIBC, IRON, RETICCTPCT in the last 72 hours. Urine analysis:    Component Value Date/Time   COLORURINE YELLOW 09/16/2014 0714   APPEARANCEUR CLOUDY (A) 09/16/2014 0714   LABSPEC 1.024 09/16/2014 0714   PHURINE 6.0 09/16/2014 0714   GLUCOSEU NEGATIVE 09/16/2014 0714   HGBUR LARGE (A) 09/16/2014 0714   BILIRUBINUR NEGATIVE 09/16/2014 0714   BILIRUBINUR n 11/30/2012 1256   KETONESUR 40 (A) 09/16/2014 0714   PROTEINUR NEGATIVE 09/16/2014 0714   UROBILINOGEN 0.2 09/16/2014 0714   NITRITE NEGATIVE 09/16/2014 0714   LEUKOCYTESUR NEGATIVE 09/16/2014 0714   Sepsis  Labs: @LABRCNTIP (procalcitonin:4,lacticidven:4)  ) Recent Results (from the past 240 hour(s))  Aerobic/Anaerobic Culture (surgical/deep wound)     Status: None   Collection Time: 11/20/15  6:53 PM  Result Value Ref Range Status   Specimen Description ABSCESS  Final   Special Requests VULVA LESIONS  Final   Gram Stain   Final    ABUNDANT WBC PRESENT, PREDOMINANTLY PMN RARE GRAM POSITIVE COCCI IN PAIRS    Culture   Final    FEW METHICILLIN RESISTANT STAPHYLOCOCCUS AUREUS NO ANAEROBES ISOLATED Performed at [facility]    Report Status 11/26/2015 FINAL  Final   Organism ID, Bacteria METHICILLIN RESISTANT STAPHYLOCOCCUS AUREUS  Final      Susceptibility   Methicillin resistant staphylococcus aureus - MIC*    CIPROFLOXACIN >=8 RESISTANT Resistant     ERYTHROMYCIN >=8 RESISTANT Resistant     GENTAMICIN <=0.5 SENSITIVE Sensitive     OXACILLIN >=4 RESISTANT Resistant     TETRACYCLINE <=1 SENSITIVE Sensitive     VANCOMYCIN <=0.5 SENSITIVE Sensitive  TRIMETH/SULFA <=10 SENSITIVE Sensitive     CLINDAMYCIN <=0.25 SENSITIVE Sensitive     RIFAMPIN <=0.5 SENSITIVE Sensitive     Inducible Clindamycin NEGATIVE Sensitive     * FEW METHICILLIN RESISTANT STAPHYLOCOCCUS AUREUS  Culture, blood (routine x 2)     Status: None (Preliminary result)   Collection Time: 11/23/15 10:33 PM  Result Value Ref Range Status   Specimen Description BLOOD LEFT HAND  Final   Special Requests BOTTLES DRAWN AEROBIC AND ANAEROBIC 3CC EACH  Final   Culture   Final    NO GROWTH 4 DAYS Performed at [facility]    Report Status PENDING  Incomplete  Culture, blood (routine x 2)     Status: None (Preliminary result)   Collection Time: 11/24/15  2:00 AM  Result Value Ref Range Status   Specimen Description BLOOD LEFT ARM  Final   Special Requests   Final    BOTTLES DRAWN AEROBIC AND ANAEROBIC 9 CC BOTH BOTTLES   Culture   Final    NO GROWTH 4 DAYS Performed at [facility]    Report  Status PENDING  Incomplete  Surgical pcr screen     Status: Abnormal   Collection Time: 11/27/15 12:59 PM  Result Value Ref Range Status   MRSA, PCR INVALID RESULTS, SPECIMEN SENT FOR CULTURE (A) NEGATIVE Final   Staphylococcus aureus INVALID RESULTS, SPECIMEN SENT FOR CULTURE (A) NEGATIVE Final    Comment:        The Xpert SA Assay (FDA approved for NASAL specimens in patients over 30 years of age), is one component of a comprehensive surveillance program.  Test performance has been validated by [facility] for patients greater than or equal to 56 year old. It is not intended to diagnose infection nor to guide or monitor treatment.   MRSA culture     Status: None (Preliminary result)   Collection Time: 11/27/15  1:00 PM  Result Value Ref Range Status   Specimen Description NOSE  Final   Special Requests NONE  Final   Culture   Final    NO GROWTH < 24 HOURS Performed at [facility]    Report Status PENDING  Incomplete  Aerobic/Anaerobic Culture (surgical/deep wound)     Status: None (Preliminary result)   Collection Time: 11/27/15  2:11 PM  Result Value Ref Range Status   Specimen Description ABSCESS ABDOMINAL WALL  Final   Special Requests NONE  Final   Gram Stain   Final    FEW WBC PRESENT, PREDOMINANTLY PMN FEW GRAM POSITIVE COCCI IN CLUSTERS Performed at [facility]    Culture PENDING  Incomplete   Report Status PENDING  Incomplete     Invalid input(s): PROCALCITONIN, [place]   Radiology Studies: No results found.      Scheduled Meds: . acetaminophen  1,000 mg Oral TID  . buPROPion  150 mg Oral Daily  . celecoxib  200 mg Oral QHS  . enoxaparin (LOVENOX) injection  40 mg Subcutaneous Q24H  . estradiol  1 mg Oral QHS  . feeding supplement (ENSURE ENLIVE)  237 mL Oral Q24H  . feeding supplement (PRO-STAT SUGAR FREE 64)  30 mL Oral Daily  . lip balm  1 application Topical BID  . multivitamin with minerals  1 tablet Oral Daily  .  progesterone  100 mg Oral QHS  . senna  1 tablet Oral BID  . vancomycin  1,000 mg Intravenous Q12H  . vitamin C  500 mg  Oral BID   Continuous Infusions: . sodium chloride 0.9 % 1,000 mL with potassium chloride 20 mEq infusion 100 mL/hr at 11/28/15 0252     LOS: 4 days    Time spent: 35 minutes    Arayna Illescas A, MD [facility] Pager [phone]  If 7PM-7AM, please contact night-coverage [email or web address] Password [facility] 11/28/2015, 12:26 PM

## 2015-11-29 LAB — BASIC METABOLIC PANEL
Anion gap: 6 (ref 5–15)
BUN: 9 mg/dL (ref 6–20)
CHLORIDE: 106 mmol/L (ref 101–111)
CO2: 26 mmol/L (ref 22–32)
Calcium: 7.3 mg/dL — ABNORMAL LOW (ref 8.9–10.3)
Creatinine, Ser: 1.05 mg/dL — ABNORMAL HIGH (ref 0.44–1.00)
GFR calc Af Amer: >60 mL/min (ref >60)
GFR calc non Af Amer: 57 mL/min — ABNORMAL LOW (ref >60)
Glucose, Bld: 98 mg/dL (ref 65–99)
POTASSIUM: 3.8 mmol/L (ref 3.5–5.1)
SODIUM: 138 mmol/L (ref 135–145)

## 2015-11-29 LAB — CULTURE, BLOOD (ROUTINE X 2)
CULTURE: NO GROWTH
Culture: NO GROWTH

## 2015-11-29 LAB — MRSA CULTURE: Culture: NO GROWTH

## 2015-11-29 LAB — VANCOMYCIN, TROUGH: VANCOMYCIN TR: 25 ug/mL — AB (ref 15–20)

## 2015-11-29 MED ORDER — SODIUM CHLORIDE 0.9 % IV SOLN
INTRAVENOUS | Status: DC
  Administered 2015-11-29 – 2015-12-05 (×6): via INTRAVENOUS

## 2015-11-29 MED ORDER — VANCOMYCIN HCL IN DEXTROSE 750-5 MG/150ML-% IV SOLN
750.0000 mg | Freq: Two times a day (BID) | INTRAVENOUS | Status: DC
  Administered 2015-11-30 – 2015-12-05 (×11): 750 mg via INTRAVENOUS
  Filled 2015-11-29 (×13): qty 150

## 2015-11-29 MED ORDER — HYDROMORPHONE HCL 1 MG/ML IJ SOLN
1.0000 mg | Freq: Once | INTRAMUSCULAR | Status: AC
  Administered 2015-11-29: 1 mg via INTRAVENOUS

## 2015-11-29 MED ORDER — LORAZEPAM 0.5 MG PO TABS
0.5000 mg | ORAL_TABLET | Freq: Two times a day (BID) | ORAL | Status: DC | PRN
Start: 2015-11-29 — End: 2015-12-05
  Administered 2015-11-29 – 2015-12-01 (×5): 0.5 mg via ORAL
  Filled 2015-11-29 (×5): qty 1

## 2015-11-29 NOTE — Progress Notes (Addendum)
2 Days Post-Op   Subjective: She reports a bad evening and asked nurse not to do her dressing change until later this morning.   Dressing change was  extremely difficul for the patient.  We had to use 3 mg of dilaudid to do both dressing changes.   The erythema around the site is better.  The opening is deep and still purulent.  On the abdominal site there is a new indurated area that is medial to the current incision that new.  I think it connects to the old, but I will watch it for now.  I showed the nurse how deep the dressings needed to go.    Objective: Vital signs in last 24 hours: Temp:  [99.1 F (37.3 C)-101 F (38.3 C)] 99.1 F (37.3 C) (08/09 0628) Pulse Rate:  [84-106] 84 (08/09 0628) Resp:  [15-16] 15 (08/09 0628) BP: (113-125)/(61-71) 120/61 (08/09 0628) SpO2:  [95 %-96 %] 95 % (08/09 0628) Last BM Date: 11/28/15 1320 by mouth intake 350 urine recorded BM  1 MAXIMUM TEMPERATURE 101 at 1900 hrs. last evening, down to 99 this a.m. Creatinine is stable,   I do not see a CBC  Intake/Output from previous day: 08/08 0701 - 08/09 0700 In: 2485 [P.O.:1320; I.V.:965; IV Piggyback:200] Out: 350 [Urine:350] Intake/Output this shift: No intake/output data recorded.  General appearance: alert, cooperative and apprehensive and very tender, both sites very painful to her. Skin: Open site on the abdomen has no necrotic areas.  The erythema is less.  Allot of purulent drainage at the base of each site.  Making slow progress.  Watching site medial abdomen as noted above.    Lab Results:   Recent Labs  11/28/15 0501  WBC 15.0*  HGB 9.8*  HCT 28.9*  PLT 462*    BMET  Recent Labs  11/28/15 0501 11/29/15 0430  NA 137 138  K 4.1 3.8  CL 105 106  CO2 27 26  GLUCOSE 94 98  BUN 11 9  CREATININE 1.09* 1.05*  CALCIUM 7.3* 7.3*   PT/INR No results for input(s): LABPROT, INR in the last 72 hours.  No results for input(s): AST, ALT, ALKPHOS, BILITOT, PROT, ALBUMIN in the  last 168 hours.   Lipase     Component Value Date/Time   LIPASE 22 09/16/2014 0515     Studies/Results: No results found.  Medications: . acetaminophen  1,000 mg Oral TID  . buPROPion  150 mg Oral Daily  . celecoxib  200 mg Oral QHS  . enoxaparin (LOVENOX) injection  40 mg Subcutaneous Q24H  . estradiol  1 mg Oral QHS  . feeding supplement (ENSURE ENLIVE)  237 mL Oral Q24H  . feeding supplement (PRO-STAT SUGAR FREE 64)  30 mL Oral Daily  . lip balm  1 application Topical BID  . multivitamin with minerals  1 tablet Oral Daily  . progesterone  100 mg Oral QHS  . senna  1 tablet Oral BID  . vancomycin  1,000 mg Intravenous Q12H  . vitamin C  500 mg Oral BID   . sodium chloride 0.9 % 1,000 mL with potassium chloride 20 mEq infusion 100 mL/hr at 11/28/15 1506   ABSCESS ABDOMINAL WALL   Special Requests NONE  Gram Stain FEW WBC PRESENT, PREDOMINANTLY PMN  FEW GRAM POSITIVE COCCI IN CLUSTERS  Performed at [place]    Assessment/Plan RIGHT AXILLA AND ABDOMINAL WallABCESS,    IRRIGATION AND DEBRIDEMENT RIGHT AXILLARY ABSCESS  AND ABDOMINAL WALL,11/27/15, Dr. Lucia Gaskins Sepsis - failed OP oral antibiotics (BLOOD CULTURES PENDING) S/p I&D vulvula &axilla - 11/16/15 Dr. Philis Pique MRSA- from [place] lesion 11/20/15 [place]: Regular/IV fluids ID: Day 7 IV antibiotics - currently just on vancomycin only -  Moderate staph Aureus from abdominal wound. DVT: Lovenox  Plan: Both sites are very painful and she is very apprehensive.  I will add some PO ativan before dressing changes and continue dressing changes here until she can tolerate more.  I will look at both sites again tomorrow.  I will get her in the shower before she goes home for dressing changes, but I don't think she can do it now.         LOS: 5 days    JENNINGS,WILLARD 11/29/2015 [phone]  Agree with above. Discussed that there may be a tract to the right of the  abdominal wound.  Will plan to get in the shower tomorrow to clean wounds. Her husband is in the room  Alphonsa Overall, MD, [facility] Surgery Pager: [phone] Office phone:  [phone]

## 2015-11-29 NOTE — Progress Notes (Signed)
Pharmacy Antibiotic Follow-up Note  Ebony Stanley is a 60 y.o. year-old female admitted on 11/23/2015.  The patient is currently on day 7 of Vancomycin for MRSA cellulitis. -8/6 VT therapeutic -8/7 s/p I/D right axillary abscess and abd wall (100 mL purulence from each site) -8/7 Abd wall abscess cx: Moderate staph aureus - sensitivities pending -8/9 new fever last night -dressing changes extremely painful for pt   Assessment/Plan: Continue Vancomycin 1gm IV q12h  -Recheck trough this afternoon Monitor renal function and cx data   Temp (24hrs), Avg:99.9 F (37.7 C), Min:99.1 F (37.3 C), Max:101 F (38.3 C)   Recent Labs Lab 11/23/15 1950 11/24/15 0219 11/25/15 0452 11/26/15 0455 11/28/15 0501  WBC 26.9* 25.5* 17.6* 17.5* 15.0*     Recent Labs Lab 11/25/15 0452 11/26/15 0455 11/27/15 0449 11/28/15 0501 11/29/15 0430  CREATININE 1.14* 1.11* 1.10* 1.09* 1.05*   Estimated Creatinine Clearance: 54.5 mL/min (by C-G formula based on SCr of 1.05 mg/dL).    Allergies  Allergen Reactions  . Kenalog [Triamcinolone] Anaphylaxis    Was mixed with lidocaine, unsure which caused the reaction   . Lidocaine Anaphylaxis    Was mixed with the Kenalog, unsure which medication caused the reaction Pt repots she received Lidocaine since that time without trouble.   Antimicrobials this admission: 8/3 Vancomycin  >>  8/3 Zosyn >> 8/4 8/4 Clinda IV >> 8/5 (3 doses/ID)  Levels/dose changes this admission: 8/6 @ 3pm VT= 18on Vanc 1gm IV q12h (prior to 7th dose) 8/9 @ 3PM VT = ____  Microbiology results: 7/31 vulva lesions: MRSA (S- clinda, gent, bactrim, tetracycline, R - cipro, erythromycin, oxacillin) 7/31 HIV: NR 8/4 BCx: NGTD 8/7 Abd wall abscess cx: Moderate staph aureus - pending 8/7 MRSA PCR cx: NGF  Thank you for allowing pharmacy to be a part of this patient's care.  Ralene Bathe, PharmD, BCPS 11/29/2015, 10:53 AM  Pager: [phone]

## 2015-11-29 NOTE — Progress Notes (Signed)
Pharmacy Antibiotic Follow-up Note  Ebony Stanley is a 60 y.o. year-old female admitted on 11/23/2015.  The patient is currently on day 7 of Vancomycin for MRSA cellulitis. -8/6 VT therapeutic -8/7 s/p I/D right axillary abscess and abd wall (100 mL purulence from each site) -8/7 Abd wall abscess cx: Moderate staph aureus - sensitivities pending -8/9 new fever last night -dressing changes extremely painful for pt - PM vanc trough supratherapeutic and full dose given.  SCr stable since 8/6 and UOP not fully charted but with multiple occurrences yesterday.   Assessment/Plan:  Reduce vancomycin to 750 mg IV q12 hr.  Will delay first dose until tomorrow evening  Monitor renal function and cx data   Temp (24hrs), Avg:99.9 F (37.7 C), Min:99.1 F (37.3 C), Max:101 F (38.3 C)   Recent Labs Lab 11/23/15 1950 11/24/15 0219 11/25/15 0452 11/26/15 0455 11/28/15 0501  WBC 26.9* 25.5* 17.6* 17.5* 15.0*     Recent Labs Lab 11/25/15 0452 11/26/15 0455 11/27/15 0449 11/28/15 0501 11/29/15 0430  CREATININE 1.14* 1.11* 1.10* 1.09* 1.05*   Estimated Creatinine Clearance: 54.5 mL/min (by C-G formula based on SCr of 1.05 mg/dL).    Allergies  Allergen Reactions  . Kenalog [Triamcinolone] Anaphylaxis    Was mixed with lidocaine, unsure which caused the reaction   . Lidocaine Anaphylaxis    Was mixed with the Kenalog, unsure which medication caused the reaction Pt repots she received Lidocaine since that time without trouble.   Antimicrobials this admission: 8/3 Vancomycin  >>  8/3 Zosyn >> 8/4 8/4 Clinda IV >> 8/5 (3 doses/ID)  Levels/dose changes this admission: 8/6 @ 3pm VT= 18on Vanc 1gm IV q12h (prior to 7th dose) 8/9 @ 3PM VT = 25 on 1g IV q12 >> 750 mg q12  Microbiology results: 7/31 vulva lesions: MRSA (S- clinda, gent, bactrim, tetracycline, R - cipro, erythromycin, oxacillin) 7/31 HIV: NR 8/4 BCx: NGTD 8/7 Abd wall abscess cx: Moderate staph aureus -  pending 8/7 MRSA PCR cx: NGF  Thank you for allowing pharmacy to be a part of this patient's care.  Reuel Boom, PharmD, BCPS Pager: [phone] 11/29/2015, 4:30 PM

## 2015-11-29 NOTE — Progress Notes (Signed)
PROGRESS NOTE  Ebony Stanley  [id number] DOB: July 01, 1955 DOA: 11/23/2015 PCP: No primary care provider on file. Outpatient Specialists:  Subjective: Patient continues to feel better, however, is still complaining of pain with certain arm movements.  Brief Narrative:  60 year old with past medical history of obesity, MRSA cellulitis came in with recurrent valvular abscess, right axillary abscess and suprapubic, ID to evaluate.  Assessment & Plan:   Principal Problem:   Cellulitis of right axilla Active Problems:   Depression   Abscess of vulva s/p I&D   Cellulitis of abdominal wall   Obesity (BMI 30-39.9)   Hypokalemia   Leukocytosis   MRSA (methicillin resistant staph aureus) culture positive   Cellulitis   Sepsis ([place])   Sepsis Present on admission, Temperature of 102.1, WBCs of 25.5 on admission and presence of infection. -s/p I&D, however, will continue vancomycin as she is still spiking fevers.  Cellulitis, MRSA Abscess -Patient with multiple areas of cellulitis (under right arm and suprapubic area) with abscess.   -Groin/labial abscess already drained and MRSA positive -Presume that this is a diffuse MRSA infection (HIV negative) -Blood cultures obtained, cannot rule out transient bacteremia as cause of the multiple cellulitis. -plan to discharge on doxycycline when transitioned to PO  Abscess of the vulva stable -Has 3 different abscesses in her vulva, this was seated and drained by OB/GYN. -Grew MRSA.  Hypokalemia Resolved  Depression -Continue Wellbutrin   DVT prophylaxis: Subcutaneous heparin Code Status: Full Code Family Communication:  Disposition Plan: 1-2 days, home on oral antibiotics. Diet: Diet regular Room service appropriate? Yes; Fluid consistency: Thin  Consultants:   Gen. surgery.  Infectious disease.  Procedures:   None  Antimicrobials:   Vancomycin  Objective: Vitals:   11/28/15 2214 11/29/15 0628 11/29/15 1031  11/29/15 1358  BP: 124/68 120/61 (!) 149/80 116/80  Pulse: (!) 106 84 (!) 103 (!) 101  Resp: 16 15  16   Temp: (!) 101 F (38.3 C) 99.1 F (37.3 C)  99.7 F (37.6 C)  TempSrc: Oral Oral  Oral  SpO2: 96% 95% 96% 96%  Weight:      Height:        Intake/Output Summary (Last 24 hours) at 11/29/15 2054 Last data filed at 11/29/15 1813  Gross per 24 hour  Intake             3100 ml  Output                0 ml  Net             3100 ml   Filed Weights   11/23/15 2245  Weight: 81.2 kg (179 lb)    Examination: General exam: Appears calm and comfortable. Husband at bedside Respiratory system: Clear to auscultation. Respiratory effort normal. Cardiovascular system: S1 & S2 heard, RRR. No JVD, murmurs, rubs, gallops or clicks. No pedal edema. Gastrointestinal system: Abdomen is nondistended, soft and nontender. No organomegaly or masses felt. Normal bowel sounds heard. Central nervous system: Alert and oriented. No focal neurological deficits. Extremities: Symmetric 5 x 5 power. Skin: dressing under right axilla and lower abdomen dry and intact Psychiatry: Judgement and insight appear normal. Mood & affect appropriate.   Data Reviewed: I have personally reviewed following labs and imaging studies  CBC:  Recent Labs Lab 11/23/15 1950 11/24/15 0219 11/25/15 0452 11/26/15 0455 11/28/15 0501  WBC 26.9* 25.5* 17.6* 17.5* 15.0*  NEUTROABS 22.6*  --   --   --   --  HGB 12.4 12.0 11.4* 10.9* 9.8*  HCT 35.5* 33.7* 32.4* 32.3* 28.9*  MCV 89.0 88.0 89.3 91.5 92.9  PLT 461* 498* 487* 475* [id number]*   Basic Metabolic Panel:  Recent Labs Lab 11/25/15 0452 11/26/15 0455 11/27/15 0449 11/28/15 0501 11/29/15 0430  NA 136 140 134* 137 138  K 2.9* 5.3* 3.6 4.1 3.8  CL 100* 109 103 105 106  CO2 26 26 25 27 26   GLUCOSE 108* 109* 106* 94 98  BUN 12 10 9 11 9   CREATININE 1.14* 1.11* 1.10* 1.09* 1.05*  CALCIUM 8.0* 7.6* 7.1* 7.3* 7.3*  MG 2.2  --   --   --   --    GFR: Estimated  Creatinine Clearance: 54.5 mL/min (by C-G formula based on SCr of 1.05 mg/dL). Liver Function Tests: No results for input(s): AST, ALT, ALKPHOS, BILITOT, PROT, ALBUMIN in the last 168 hours. No results for input(s): LIPASE, AMYLASE in the last 168 hours. No results for input(s): AMMONIA in the last 168 hours. Coagulation Profile: No results for input(s): INR, PROTIME in the last 168 hours. Cardiac Enzymes: No results for input(s): CKTOTAL, CKMB, CKMBINDEX, TROPONINI in the last 168 hours. BNP (last 3 results) No results for input(s): PROBNP in the last 8760 hours. HbA1C: No results for input(s): HGBA1C in the last 72 hours. CBG: No results for input(s): GLUCAP in the last 168 hours. Lipid Profile: No results for input(s): CHOL, HDL, LDLCALC, TRIG, CHOLHDL, LDLDIRECT in the last 72 hours. Thyroid Function Tests: No results for input(s): TSH, T4TOTAL, FREET4, T3FREE, THYROIDAB in the last 72 hours. Anemia Panel: No results for input(s): VITAMINB12, FOLATE, FERRITIN, TIBC, IRON, RETICCTPCT in the last 72 hours. Urine analysis:    Component Value Date/Time   COLORURINE YELLOW 09/16/2014 0714   APPEARANCEUR CLOUDY (A) 09/16/2014 0714   LABSPEC 1.024 09/16/2014 0714   PHURINE 6.0 09/16/2014 0714   GLUCOSEU NEGATIVE 09/16/2014 0714   HGBUR LARGE (A) 09/16/2014 0714   BILIRUBINUR NEGATIVE 09/16/2014 0714   BILIRUBINUR n 11/30/2012 1256   KETONESUR 40 (A) 09/16/2014 0714   PROTEINUR NEGATIVE 09/16/2014 0714   UROBILINOGEN 0.2 09/16/2014 0714   NITRITE NEGATIVE 09/16/2014 0714   LEUKOCYTESUR NEGATIVE 09/16/2014 0714   Sepsis Labs: @LABRCNTIP (procalcitonin:4,lacticidven:4)  ) Recent Results (from the past 240 hour(s))  Aerobic/Anaerobic Culture (surgical/deep wound)     Status: None   Collection Time: 11/20/15  6:53 PM  Result Value Ref Range Status   Specimen Description ABSCESS  Final   Special Requests VULVA LESIONS  Final   Gram Stain   Final    ABUNDANT WBC PRESENT,  PREDOMINANTLY PMN RARE GRAM POSITIVE COCCI IN PAIRS    Culture   Final    FEW METHICILLIN RESISTANT STAPHYLOCOCCUS AUREUS NO ANAEROBES ISOLATED Performed at [facility]    Report Status 11/26/2015 FINAL  Final   Organism ID, Bacteria METHICILLIN RESISTANT STAPHYLOCOCCUS AUREUS  Final      Susceptibility   Methicillin resistant staphylococcus aureus - MIC*    CIPROFLOXACIN >=8 RESISTANT Resistant     ERYTHROMYCIN >=8 RESISTANT Resistant     GENTAMICIN <=0.5 SENSITIVE Sensitive     OXACILLIN >=4 RESISTANT Resistant     TETRACYCLINE <=1 SENSITIVE Sensitive     VANCOMYCIN <=0.5 SENSITIVE Sensitive     TRIMETH/SULFA <=10 SENSITIVE Sensitive     CLINDAMYCIN <=0.25 SENSITIVE Sensitive     RIFAMPIN <=0.5 SENSITIVE Sensitive     Inducible Clindamycin NEGATIVE Sensitive     * FEW METHICILLIN RESISTANT STAPHYLOCOCCUS AUREUS  Culture, blood (routine x 2)     Status: None   Collection Time: 11/23/15 10:33 PM  Result Value Ref Range Status   Specimen Description BLOOD LEFT HAND  Final   Special Requests BOTTLES DRAWN AEROBIC AND ANAEROBIC 3CC EACH  Final   Culture   Final    NO GROWTH 5 DAYS Performed at [facility]    Report Status 11/29/2015 FINAL  Final  Culture, blood (routine x 2)     Status: None   Collection Time: 11/24/15  2:00 AM  Result Value Ref Range Status   Specimen Description BLOOD LEFT ARM  Final   Special Requests   Final    BOTTLES DRAWN AEROBIC AND ANAEROBIC 9 CC BOTH BOTTLES   Culture   Final    NO GROWTH 5 DAYS Performed at [facility]    Report Status 11/29/2015 FINAL  Final  Surgical pcr screen     Status: Abnormal   Collection Time: 11/27/15 12:59 PM  Result Value Ref Range Status   MRSA, PCR INVALID RESULTS, SPECIMEN SENT FOR CULTURE (A) NEGATIVE Final   Staphylococcus aureus INVALID RESULTS, SPECIMEN SENT FOR CULTURE (A) NEGATIVE Final    Comment:        The Xpert SA Assay (FDA approved for NASAL specimens in patients over 67  years of age), is one component of a comprehensive surveillance program.  Test performance has been validated by [facility] for patients greater than or equal to 91 year old. It is not intended to diagnose infection nor to guide or monitor treatment.   MRSA culture     Status: None   Collection Time: 11/27/15  1:00 PM  Result Value Ref Range Status   Specimen Description NOSE  Final   Special Requests NONE  Final   Culture   Final    NO GROWTH 2 DAYS Performed at [facility]    Report Status 11/29/2015 FINAL  Final  Aerobic/Anaerobic Culture (surgical/deep wound)     Status: None (Preliminary result)   Collection Time: 11/27/15  2:11 PM  Result Value Ref Range Status   Specimen Description ABSCESS ABDOMINAL WALL  Final   Special Requests NONE  Final   Gram Stain   Final    FEW WBC PRESENT, PREDOMINANTLY PMN FEW GRAM POSITIVE COCCI IN CLUSTERS Performed at [facility]    Culture   Final    MODERATE METHICILLIN RESISTANT STAPHYLOCOCCUS AUREUS NO ANAEROBES ISOLATED; CULTURE IN PROGRESS FOR 5 DAYS    Report Status PENDING  Incomplete   Organism ID, Bacteria METHICILLIN RESISTANT STAPHYLOCOCCUS AUREUS  Final      Susceptibility   Methicillin resistant staphylococcus aureus - MIC*    CIPROFLOXACIN >=8 RESISTANT Resistant     ERYTHROMYCIN >=8 RESISTANT Resistant     GENTAMICIN <=0.5 SENSITIVE Sensitive     OXACILLIN >=4 RESISTANT Resistant     TETRACYCLINE <=1 SENSITIVE Sensitive     VANCOMYCIN <=0.5 SENSITIVE Sensitive     TRIMETH/SULFA <=10 SENSITIVE Sensitive     CLINDAMYCIN <=0.25 SENSITIVE Sensitive     RIFAMPIN <=0.5 SENSITIVE Sensitive     Inducible Clindamycin NEGATIVE Sensitive     * MODERATE METHICILLIN RESISTANT STAPHYLOCOCCUS AUREUS     Invalid input(s): PROCALCITONIN, LACTICACIDVEN   Radiology Studies: No results found.      Scheduled Meds: . buPROPion  150 mg Oral Daily  . celecoxib  200 mg Oral QHS  . enoxaparin (LOVENOX)  injection  40 mg Subcutaneous Q24H  .  estradiol  1 mg Oral QHS  . feeding supplement (ENSURE ENLIVE)  237 mL Oral Q24H  . feeding supplement (PRO-STAT SUGAR FREE 64)  30 mL Oral Daily  . lip balm  1 application Topical BID  . multivitamin with minerals  1 tablet Oral Daily  . progesterone  100 mg Oral QHS  . senna  1 tablet Oral BID  . [START ON 11/30/2015] vancomycin  750 mg Intravenous Q12H  . vitamin C  500 mg Oral BID   Continuous Infusions: . sodium chloride 0.9 % 1,000 mL with potassium chloride 20 mEq infusion 100 mL/hr at 11/29/15 1533     LOS: 5 days    Time spent: 35 minutes    Cordelia Poche, MD [facility] Pager [phone]  If 7PM-7AM, please contact night-coverage [email or web address] Password [email or web address] 11/29/2015, 8:54 PM

## 2015-11-30 DIAGNOSIS — Z22322 Carrier or suspected carrier of Methicillin resistant Staphylococcus aureus: Secondary | ICD-10-CM

## 2015-11-30 DIAGNOSIS — E669 Obesity, unspecified: Secondary | ICD-10-CM

## 2015-11-30 DIAGNOSIS — L0291 Cutaneous abscess, unspecified: Secondary | ICD-10-CM

## 2015-11-30 DIAGNOSIS — E876 Hypokalemia: Secondary | ICD-10-CM

## 2015-11-30 LAB — BASIC METABOLIC PANEL
ANION GAP: 7 (ref 5–15)
BUN: 8 mg/dL (ref 6–20)
CALCIUM: 7.6 mg/dL — AB (ref 8.9–10.3)
CO2: 25 mmol/L (ref 22–32)
CREATININE: 0.88 mg/dL (ref 0.44–1.00)
Chloride: 105 mmol/L (ref 101–111)
GFR calc non Af Amer: >60 mL/min (ref >60)
Glucose, Bld: 99 mg/dL (ref 65–99)
Potassium: 4.2 mmol/L (ref 3.5–5.1)
SODIUM: 137 mmol/L (ref 135–145)

## 2015-11-30 LAB — CBC
HCT: 27.6 % — ABNORMAL LOW (ref 36.0–46.0)
HEMOGLOBIN: 9.1 g/dL — AB (ref 12.0–15.0)
MCH: 30.3 pg (ref 26.0–34.0)
MCHC: 33 g/dL (ref 30.0–36.0)
MCV: 92 fL (ref 78.0–100.0)
PLATELETS: 523 10*3/uL — AB (ref 150–400)
RBC: 3 MIL/uL — AB (ref 3.87–5.11)
RDW: 14.9 % (ref 11.5–15.5)
WBC: 13.9 10*3/uL — AB (ref 4.0–10.5)

## 2015-11-30 NOTE — Progress Notes (Signed)
PROGRESS NOTE  Ebony Stanley  [id number] DOB: 1955/09/14 DOA: 11/23/2015 PCP: No primary care provider on file. Outpatient Specialists:  Subjective: Pain is tolerable during most of the day, however, she is requiring IV medication for dressing/packing changes. No other events overnight. Fever to 100.4 overnight  Brief Narrative:  60 year old with past medical history of obesity, MRSA cellulitis came in with recurrent valvular abscess, right axillary abscess and suprapubic, ID to evaluate. Abscesses were drained by surgery and patient is on Vancomycin. Culture grew MRSA.  Assessment & Plan:   Principal Problem:   Cellulitis of right axilla Active Problems:   Depression   Abscess of vulva s/p I&D   Cellulitis of abdominal wall   Obesity (BMI 30-39.9)   Hypokalemia   Leukocytosis   MRSA (methicillin resistant staph aureus) culture positive   Cellulitis   Sepsis ([place])   Sepsis Resolved. Present on admission, Temperature of 102.1, WBCs of 25.5 on admission and presence of infection. -s/p I&D, however, will continue vancomycin as she is still spiking fevers.  Cellulitis, MRSA Abscess, MRSA Culture of wound grew MRSA sensitive to Vancomycin and Clindamycin -Since patient will continue to be inpatient, will continue Vancomycin (8/3>>8/16) -Clindamycin closer to discharge to complete  -Blood culture negative x5 days  Abscess of the vulva stable -Has 3 different abscesses in her vulva, this was seated and drained by OB/GYN. -Grew MRSA.  Hypokalemia Resolved  Depression -Continue Wellbutrin   DVT prophylaxis: Subcutaneous heparin Code Status: Full Code Family Communication:  Disposition Plan: pending improvement in pain, home on oral antibiotics. Diet: Diet regular Room service appropriate? Yes; Fluid consistency: Thin  Consultants:   Gen. surgery.  Infectious disease.  Procedures:   None  Antimicrobials:   Vancomycin  Objective: Vitals:   11/29/15  1358 11/29/15 2207 11/30/15 0627 11/30/15 1414  BP: 116/80 127/68 (!) 100/40 109/72  Pulse: (!) 101 (!) 107 96 95  Resp: 16 16 15 16   Temp: 99.7 F (37.6 C) (!) 100.4 F (38 C) 99.6 F (37.6 C) 99.8 F (37.7 C)  TempSrc: Oral Oral Oral Oral  SpO2: 96% 99% 99% 98%  Weight:      Height:        Intake/Output Summary (Last 24 hours) at 11/30/15 1443 Last data filed at 11/30/15 1409  Gross per 24 hour  Intake             2125 ml  Output              525 ml  Net             1600 ml   Filed Weights   11/23/15 2245  Weight: 81.2 kg (179 lb)    Examination: General exam: Appears calm and comfortable. Daughter and grandson at bedside Respiratory system: Clear to auscultation. Respiratory effort normal. Cardiovascular system: S1 & S2 heard, RRR. No JVD, murmurs, rubs, gallops or clicks. No pedal edema. Gastrointestinal system: Abdomen is nondistended, soft and nontender. No organomegaly or masses felt. Normal bowel sounds heard. Central nervous system: Alert and oriented. No focal neurological deficits. Extremities: Symmetric 5 x 5 power. Skin: dressing under right axilla and lower abdomen dry and intact Psychiatry: Judgement and insight appear normal. Mood & affect appropriate.   Data Reviewed: I have personally reviewed following labs and imaging studies  CBC:  Recent Labs Lab 11/23/15 1950 11/24/15 0219 11/25/15 0452 11/26/15 0455 11/28/15 0501 11/30/15 0411  WBC 26.9* 25.5* 17.6* 17.5* 15.0* 13.9*  NEUTROABS 22.6*  --   --   --   --   --  HGB 12.4 12.0 11.4* 10.9* 9.8* 9.1*  HCT 35.5* 33.7* 32.4* 32.3* 28.9* 27.6*  MCV 89.0 88.0 89.3 91.5 92.9 92.0  PLT 461* 498* 487* 475* 462* 0000000*   Basic Metabolic Panel:  Recent Labs Lab 11/25/15 0452 11/26/15 0455 11/27/15 0449 11/28/15 0501 11/29/15 0430 11/30/15 0411  NA 136 140 134* 137 138 137  K 2.9* 5.3* 3.6 4.1 3.8 4.2  CL 100* 109 103 105 106 105  CO2 26 26 25 27 26 25   GLUCOSE 108* 109* 106* 94 98 99  BUN  12 10 9 11 9 8   CREATININE 1.14* 1.11* 1.10* 1.09* 1.05* 0.88  CALCIUM 8.0* 7.6* 7.1* 7.3* 7.3* 7.6*  MG 2.2  --   --   --   --   --    GFR: Estimated Creatinine Clearance: 65 mL/min (by C-G formula based on SCr of 0.88 mg/dL). Liver Function Tests: No results for input(s): AST, ALT, ALKPHOS, BILITOT, PROT, ALBUMIN in the last 168 hours. No results for input(s): LIPASE, AMYLASE in the last 168 hours. No results for input(s): AMMONIA in the last 168 hours. Coagulation Profile: No results for input(s): INR, PROTIME in the last 168 hours. Cardiac Enzymes: No results for input(s): CKTOTAL, CKMB, CKMBINDEX, TROPONINI in the last 168 hours. BNP (last 3 results) No results for input(s): PROBNP in the last 8760 hours. HbA1C: No results for input(s): HGBA1C in the last 72 hours. CBG: No results for input(s): GLUCAP in the last 168 hours. Lipid Profile: No results for input(s): CHOL, HDL, LDLCALC, TRIG, CHOLHDL, LDLDIRECT in the last 72 hours. Thyroid Function Tests: No results for input(s): TSH, T4TOTAL, FREET4, T3FREE, THYROIDAB in the last 72 hours. Anemia Panel: No results for input(s): VITAMINB12, FOLATE, FERRITIN, TIBC, IRON, RETICCTPCT in the last 72 hours. Urine analysis:    Component Value Date/Time   COLORURINE YELLOW 09/16/2014 0714   APPEARANCEUR CLOUDY (A) 09/16/2014 0714   LABSPEC 1.024 09/16/2014 0714   PHURINE 6.0 09/16/2014 0714   GLUCOSEU NEGATIVE 09/16/2014 0714   HGBUR LARGE (A) 09/16/2014 0714   BILIRUBINUR NEGATIVE 09/16/2014 0714   BILIRUBINUR n 11/30/2012 1256   KETONESUR 40 (A) 09/16/2014 0714   PROTEINUR NEGATIVE 09/16/2014 0714   UROBILINOGEN 0.2 09/16/2014 0714   NITRITE NEGATIVE 09/16/2014 0714   LEUKOCYTESUR NEGATIVE 09/16/2014 0714   Sepsis Labs: @LABRCNTIP (procalcitonin:4,lacticidven:4)  ) Recent Results (from the past 240 hour(s))  Aerobic/Anaerobic Culture (surgical/deep wound)     Status: None   Collection Time: 11/20/15  6:53 PM  Result  Value Ref Range Status   Specimen Description ABSCESS  Final   Special Requests VULVA LESIONS  Final   Gram Stain   Final    ABUNDANT WBC PRESENT, PREDOMINANTLY PMN RARE GRAM POSITIVE COCCI IN PAIRS    Culture   Final    FEW METHICILLIN RESISTANT STAPHYLOCOCCUS AUREUS NO ANAEROBES ISOLATED Performed at [facility]    Report Status 11/26/2015 FINAL  Final   Organism ID, Bacteria METHICILLIN RESISTANT STAPHYLOCOCCUS AUREUS  Final      Susceptibility   Methicillin resistant staphylococcus aureus - MIC*    CIPROFLOXACIN >=8 RESISTANT Resistant     ERYTHROMYCIN >=8 RESISTANT Resistant     GENTAMICIN <=0.5 SENSITIVE Sensitive     OXACILLIN >=4 RESISTANT Resistant     TETRACYCLINE <=1 SENSITIVE Sensitive     VANCOMYCIN <=0.5 SENSITIVE Sensitive     TRIMETH/SULFA <=10 SENSITIVE Sensitive     CLINDAMYCIN <=0.25 SENSITIVE Sensitive     RIFAMPIN <=0.5 SENSITIVE Sensitive  Inducible Clindamycin NEGATIVE Sensitive     * FEW METHICILLIN RESISTANT STAPHYLOCOCCUS AUREUS  Culture, blood (routine x 2)     Status: None   Collection Time: 11/23/15 10:33 PM  Result Value Ref Range Status   Specimen Description BLOOD LEFT HAND  Final   Special Requests BOTTLES DRAWN AEROBIC AND ANAEROBIC 3CC EACH  Final   Culture   Final    NO GROWTH 5 DAYS Performed at [facility]    Report Status 11/29/2015 FINAL  Final  Culture, blood (routine x 2)     Status: None   Collection Time: 11/24/15  2:00 AM  Result Value Ref Range Status   Specimen Description BLOOD LEFT ARM  Final   Special Requests   Final    BOTTLES DRAWN AEROBIC AND ANAEROBIC 9 CC BOTH BOTTLES   Culture   Final    NO GROWTH 5 DAYS Performed at [facility]    Report Status 11/29/2015 FINAL  Final  Surgical pcr screen     Status: Abnormal   Collection Time: 11/27/15 12:59 PM  Result Value Ref Range Status   MRSA, PCR INVALID RESULTS, SPECIMEN SENT FOR CULTURE (A) NEGATIVE Final   Staphylococcus aureus INVALID  RESULTS, SPECIMEN SENT FOR CULTURE (A) NEGATIVE Final    Comment:        The Xpert SA Assay (FDA approved for NASAL specimens in patients over 15 years of age), is one component of a comprehensive surveillance program.  Test performance has been validated by [facility] for patients greater than or equal to 39 year old. It is not intended to diagnose infection nor to guide or monitor treatment.   MRSA culture     Status: None   Collection Time: 11/27/15  1:00 PM  Result Value Ref Range Status   Specimen Description NOSE  Final   Special Requests NONE  Final   Culture   Final    NO GROWTH 2 DAYS Performed at [facility]    Report Status 11/29/2015 FINAL  Final  Aerobic/Anaerobic Culture (surgical/deep wound)     Status: None (Preliminary result)   Collection Time: 11/27/15  2:11 PM  Result Value Ref Range Status   Specimen Description ABSCESS ABDOMINAL WALL  Final   Special Requests NONE  Final   Gram Stain   Final    FEW WBC PRESENT, PREDOMINANTLY PMN FEW GRAM POSITIVE COCCI IN CLUSTERS Performed at [facility]    Culture   Final    MODERATE METHICILLIN RESISTANT STAPHYLOCOCCUS AUREUS NO ANAEROBES ISOLATED; CULTURE IN PROGRESS FOR 5 DAYS    Report Status PENDING  Incomplete   Organism ID, Bacteria METHICILLIN RESISTANT STAPHYLOCOCCUS AUREUS  Final      Susceptibility   Methicillin resistant staphylococcus aureus - MIC*    CIPROFLOXACIN >=8 RESISTANT Resistant     ERYTHROMYCIN >=8 RESISTANT Resistant     GENTAMICIN <=0.5 SENSITIVE Sensitive     OXACILLIN >=4 RESISTANT Resistant     TETRACYCLINE <=1 SENSITIVE Sensitive     VANCOMYCIN <=0.5 SENSITIVE Sensitive     TRIMETH/SULFA <=10 SENSITIVE Sensitive     CLINDAMYCIN <=0.25 SENSITIVE Sensitive     RIFAMPIN <=0.5 SENSITIVE Sensitive     Inducible Clindamycin NEGATIVE Sensitive     * MODERATE METHICILLIN RESISTANT STAPHYLOCOCCUS AUREUS     Invalid input(s): PROCALCITONIN, LACTICACIDVEN   Radiology  Studies: No results found.      Scheduled Meds: . buPROPion  150 mg Oral Daily  . celecoxib  200 mg Oral QHS  . enoxaparin (LOVENOX) injection  40 mg Subcutaneous Q24H  . estradiol  1 mg Oral QHS  . feeding supplement (ENSURE ENLIVE)  237 mL Oral Q24H  . feeding supplement (PRO-STAT SUGAR FREE 64)  30 mL Oral Daily  . lip balm  1 application Topical BID  . multivitamin with minerals  1 tablet Oral Daily  . progesterone  100 mg Oral QHS  . senna  1 tablet Oral BID  . vancomycin  750 mg Intravenous Q12H  . vitamin C  500 mg Oral BID   Continuous Infusions: . sodium chloride 100 mL/hr at 11/30/15 0000     LOS: 6 days    Cordelia Poche, MD [facility] Pager [phone]  If 7PM-7AM, please contact night-coverage [email or web address] Password [email or web address] 11/30/2015, 2:43 PM

## 2015-11-30 NOTE — Progress Notes (Signed)
3 Days Post-Op  Subjective: We got her to shower today and pull dressing out in the shower.  She has major anxiety with anything you do with either of these sites.  Her daughter was here today and was also reporting that she had same issue with the packing of the Vulva site.  She gets tearful with just retracting the opening of the wound, anything else is even more difficult for her.  We are using Ativan  PO  and dilaudid IV for removal of the packing and dressing change.   Both sites look much better.  The surrounding erythema is much better and is just along the wound borders. She has some skin she is going to lose, dermis like with a sun burn.   There was less drainage and the interior portion of the wound is pretty clean.  She has a deep site going toward the shoulder on the right axilla that is deep but I think is OK.  I am still worried about the medial site on the abdomen.    Objective: Vital signs in last 24 hours: Temp:  [99.6 F (37.6 C)-100.4 F (38 C)] 99.6 F (37.6 C) (08/10 0627) Pulse Rate:  [96-107] 96 (08/10 0627) Resp:  [15-16] 15 (08/10 0627) BP: (100-149)/(40-80) 100/40 (08/10 0627) SpO2:  [96 %-99 %] 99 % (08/10 0627) Last BM Date: 11/30/15 1440 PO Afebrile, now a second Temp spike at 1900 last PM also (second night in a row) Specimen Description ABSCESS ABDOMINAL WALL  Special Requests NONE  Gram Stain FEW WBC PRESENT, PREDOMINANTLY PMN  FEW GRAM POSITIVE COCCI IN CLUSTERS  Performed at [place]  NO ANAEROBES ISOLATED; CULTURE IN PROGRESS FOR 5 DAYS   Report Status PENDING  Organism ID, Bacteria METHICILLIN RESISTANT STAPHYLOCOCCUS AUREUS   WBC improving but still elevated Creatinine stable  Intake/Output from previous day: 08/09 0701 - 08/10 0700 In: 2785 [P.O.:1440; I.V.:1145; IV Piggyback:200] Out: 325 [Urine:325] Intake/Output this shift: No intake/output data  recorded.  General appearance: alert, cooperative and very anxious and has very poor tolerance of the dressing change.   Skin: Both sites look better.  Erythema has almost completely resolved.  Open site of the abdominal wall has on indurated area that I am concerned with.  We are trying to get packing to this site within the wound.  Axillary site is making slow progress.  Pain is still the big issue with the wound care.    Lab Results:   Recent Labs  11/28/15 0501 11/30/15 0411  WBC 15.0* 13.9*  HGB 9.8* 9.1*  HCT 28.9* 27.6*  PLT 462* 523*    BMET  Recent Labs  11/29/15 0430 11/30/15 0411  NA 138 137  K 3.8 4.2  CL 106 105  CO2 26 25  GLUCOSE 98 99  BUN 9 8  CREATININE 1.05* 0.88  CALCIUM 7.3* 7.6*   PT/INR No results for input(s): LABPROT, INR in the last 72 hours.  No results for input(s): AST, ALT, ALKPHOS, BILITOT, PROT, ALBUMIN in the last 168 hours.   Lipase     Component Value Date/Time   LIPASE 22 09/16/2014 0515     Studies/Results: No results found.  Medications: . buPROPion  150 mg Oral Daily  . celecoxib  200 mg Oral QHS  . enoxaparin (LOVENOX) injection  40 mg Subcutaneous Q24H  . estradiol  1 mg Oral QHS  . feeding supplement (ENSURE ENLIVE)  237 mL Oral  Q24H  . feeding supplement (PRO-STAT SUGAR FREE 64)  30 mL Oral Daily  . lip balm  1 application Topical BID  . multivitamin with minerals  1 tablet Oral Daily  . progesterone  100 mg Oral QHS  . senna  1 tablet Oral BID  . vancomycin  750 mg Intravenous Q12H  . vitamin C  500 mg Oral BID    Assessment/Plan RIGHT AXILLA AND ABDOMINAL WallABCESS,                        IRRIGATION AND DEBRIDEMENT RIGHT AXILLARY ABSCESS AND ABDOMINAL WALL,11/27/15, Dr. Lucia Gaskins Sepsis - failed OP oral antibiotics (BLOOD CULTURES PENDING) S/p I&D vulvula &axilla - 11/16/15 Dr. Philis Pique MRSA- from [place] lesion 11/20/15 Fen: Regular/IV fluids ID: Day 7IV antibiotics - currently just on vancomycin only -   Moderate staph Aureus from abdominal wound. DVT: Lovenox   Plan:  Continue ongoing dressing changes and keep her here till we can do these without IV medication.  Appreciate Dr. Storm Frisk recommendations.      LOS: 6 days    JENNINGS,WILLARD 11/30/2015 [phone]  Agree with above.  Alphonsa Overall, MD, [facility] Surgery Pager: [phone] Office phone:  [phone]

## 2015-11-30 NOTE — Progress Notes (Addendum)
[facility] for Infectious Disease    Date of Admission:  11/23/2015   Total days of antibiotics 8        Day 8 vanco   ID: Ebony Stanley is a 60 y.o. female with multiple MRSA absess Principal Problem:   Cellulitis of right axilla Active Problems:   Depression   Abscess of vulva s/p I&D   Cellulitis of abdominal wall   Obesity (BMI 30-39.9)   Hypokalemia   Leukocytosis   MRSA (methicillin resistant staph aureus) culture positive   Cellulitis   Sepsis ([place])    Subjective: Fever to 100.4 yesterday. Still having significant amount of pain and anxiety with dressing changes. Continues to have improvement with woudn healing more so with axilla than lower abdomen wound. Patient states that it is very tender to lay on her right side(axillary area) per orders surgery note, erythema is much improved    Medications:  . buPROPion  150 mg Oral Daily  . celecoxib  200 mg Oral QHS  . enoxaparin (LOVENOX) injection  40 mg Subcutaneous Q24H  . estradiol  1 mg Oral QHS  . feeding supplement (ENSURE ENLIVE)  237 mL Oral Q24H  . feeding supplement (PRO-STAT SUGAR FREE 64)  30 mL Oral Daily  . lip balm  1 application Topical BID  . multivitamin with minerals  1 tablet Oral Daily  . progesterone  100 mg Oral QHS  . senna  1 tablet Oral BID  . vancomycin  750 mg Intravenous Q12H  . vitamin C  500 mg Oral BID    Objective: Vital signs in last 24 hours: Temp:  [99.6 F (37.6 C)-100.4 F (38 C)] 99.6 F (37.6 C) (08/10 0627) Pulse Rate:  [96-107] 96 (08/10 0627) Resp:  [15-16] 15 (08/10 0627) BP: (100-149)/(40-80) 100/40 (08/10 0627) SpO2:  [96 %-99 %] 99 % (08/10 [id number]) Physical Exam  Constitutional:  oriented to person, place, and time. appears well-developed and well-nourished. No distress.  HENT: [place]/AT, PERRLA, no scleral icterus Mouth/Throat: Oropharynx is clear and moist. No oropharyngeal exudate.  Cardiovascular: Normal rate, regular rhythm and normal heart sounds. Exam  reveals no gallop and no friction rub.  No murmur heard.  Pulmonary/Chest: Effort normal and breath sounds normal. No respiratory distress.  has no wheezes.  Axilla = right axilla bandaged from I x D Neck = supple, no nuchal rigidity Abdominal: Soft. Bowel sounds are normal.  exhibits no distension. Mild tenderness near lower abdomen. Bandaged at lower aspect of abd from IX D Lymphadenopathy: no cervical adenopathy. No axillary adenopathy Neurological: alert and oriented to person, place, and time.  Skin: Skin is warm and dry. No rash noted. No erythema.  Psychiatric: a normal mood and affect.  behavior is normal.    Lab Results  Recent Labs  11/28/15 0501 11/29/15 0430 11/30/15 0411  WBC 15.0*  --  13.9*  HGB 9.8*  --  9.1*  HCT 28.9*  --  27.6*  NA 137 138 137  K 4.1 3.8 4.2  CL 105 106 105  CO2 27 26 25   BUN 11 9 8   CREATININE 1.09* 1.05* 0.88    Microbiology: 8/7 OR wound cx- MRSA 8/4 blood cx NGTD Studies/Results: No results found.   Assessment/Plan:  - has better source control now that she has undergone I x D, though still some purulence in wound bed per notes. Continue on IV vancomycin  - due to burden of disease, will recommend to give IV abtx as outpatient/SNF. Recommend  to get picc line, will place order - due to difficulty with pain and anxiety with dressing changes, agree with primary team that premedication will be useful - recommend to give IV vanco for addn 12 days to finish 3 wk course of treatment - family mentioned that they are unsure if they can do dressing changes at home especially if patient is tearful, maybe better suited for short-term SNF vs home health doing the changes  - pain management -> consider adding lidocaine patch near surgical incision area as possible addition  Therapeutic drug monitoring = Currently therapeutic. Ain for goal of vanco trough 15-20.  Spent 30 min with patient in face to face time discussing management of  infection,and picc line    Yiannis Tulloch, [facility] for Infectious Diseases Cell: [phone] Pager: [phone]  11/30/2015, 9:10 AM

## 2015-12-01 MED ORDER — ACETAMINOPHEN 325 MG PO TABS
650.0000 mg | ORAL_TABLET | Freq: Four times a day (QID) | ORAL | Status: DC | PRN
  Administered 2015-12-01: 650 mg via ORAL
  Filled 2015-12-01: qty 2

## 2015-12-01 MED ORDER — SODIUM CHLORIDE 0.9% FLUSH
10.0000 mL | Freq: Two times a day (BID) | INTRAVENOUS | Status: DC
  Administered 2015-12-02: 10 mL

## 2015-12-01 MED ORDER — SODIUM CHLORIDE 0.9% FLUSH
10.0000 mL | INTRAVENOUS | Status: DC | PRN
  Administered 2015-12-03 – 2015-12-04 (×2): 10 mL
  Filled 2015-12-01 (×2): qty 40

## 2015-12-01 NOTE — Progress Notes (Signed)
[facility] for Infectious Disease    Date of Admission:  11/23/2015   Total days of antibiotics 9        Day 9 vanco   ID: Ebony Stanley is a 60 y.o. female with multiple MRSA absess Principal Problem:   Cellulitis of right axilla Active Problems:   Depression   Abscess of vulva s/p I&D   Cellulitis of abdominal wall   Obesity (BMI 30-39.9)   Hypokalemia   Leukocytosis   MRSA (methicillin resistant staph aureus) culture positive   Cellulitis   Sepsis ([place])   Abscess    Subjective: Remains afebrile. Per surgery wound bed improving    Medications:  . buPROPion  150 mg Oral Daily  . celecoxib  200 mg Oral QHS  . enoxaparin (LOVENOX) injection  40 mg Subcutaneous Q24H  . estradiol  1 mg Oral QHS  . feeding supplement (ENSURE ENLIVE)  237 mL Oral Q24H  . feeding supplement (PRO-STAT SUGAR FREE 64)  30 mL Oral Daily  . lip balm  1 application Topical BID  . multivitamin with minerals  1 tablet Oral Daily  . progesterone  100 mg Oral QHS  . senna  1 tablet Oral BID  . sodium chloride flush  10-40 mL Intracatheter Q12H  . vancomycin  750 mg Intravenous Q12H  . vitamin C  500 mg Oral BID    Objective: Vital signs in last 24 hours: Temp:  [98.8 F (37.1 C)-100.2 F (37.9 C)] 99.1 F (37.3 C) (08/11 1344) Pulse Rate:  [88-98] 98 (08/11 1344) Resp:  [16] 16 (08/11 1344) BP: (117-133)/(62-86) 133/86 (08/11 1344) SpO2:  [93 %-96 %] 94 % (08/11 1344) Physical Exam  Constitutional:  oriented to person, place, and time. appears well-developed and well-nourished. No distress.  HENT: [place]/AT, PERRLA, no scleral icterus Mouth/Throat: Oropharynx is clear and moist. No oropharyngeal exudate.  Cardiovascular: Normal rate, regular rhythm and normal heart sounds. Exam reveals no gallop and no friction rub.  No murmur heard.  Pulmonary/Chest: Effort normal and breath sounds normal. No respiratory distress.  has no wheezes.  Axilla = right axilla bandaged from I x D Neck =  supple, no nuchal rigidity Abdominal: Soft. Bowel sounds are normal.  exhibits no distension. Mild tenderness near lower abdomen. Bandaged at lower aspect of abd from IX D Lymphadenopathy: no cervical adenopathy. No axillary adenopathy Neurological: alert and oriented to person, place, and time.  Skin: Skin is warm and dry. No rash noted. No erythema.  Psychiatric: a normal mood and affect.  behavior is normal.    Lab Results  Recent Labs  11/29/15 0430 11/30/15 0411  WBC  --  13.9*  HGB  --  9.1*  HCT  --  27.6*  NA 138 137  K 3.8 4.2  CL 106 105  CO2 26 25  BUN 9 8  CREATININE 1.05* 0.88    Microbiology: 8/7 OR wound cx- MRSA 8/4 blood cx NGTD Studies/Results: No results found.   Assessment/Plan:  - has better source control now that she has undergone I x D,  Continue on IV vancomycin  -  - recommend to give IV vanco for addn 10 days to finish 3 wk course of treatment - agree with primary team that cleaning wound in addition to tolerating dressing changes will be key in healing  - family mentioned that they are unsure if they can do dressing changes at home especially if patient is tearful, maybe better suited for short-term SNF vs  home health doing the changes  - pain management -> consider adding lidocaine patch near surgical incision area as possible addition  Therapeutic drug monitoring = Currently therapeutic. Aim for goal of vanco trough 15-20.  - will arrange for follow up at [facility] in 2-3wk  - If she gets discharged prior to Monday,  Please have facility check BMP twice per week while on vancomycin to ensure her Cr does not change, also check vancomycin trough twice per week. Can have her picc line pulled after IV abtx have stopped  End date for vancomycin will be: august 21st.  Dr Tommy Medal available for question. Will see her on Monday if still here   Baxter Flattery [facility] for Infectious Diseases Cell: [phone] Pager:  [phone]  12/01/2015, 3:32 PM

## 2015-12-01 NOTE — Progress Notes (Signed)
Peripherally Inserted Central Catheter/Midline Placement  The IV Nurse has discussed with the patient and/or persons authorized to consent for the patient, the purpose of this procedure and the potential benefits and risks involved with this procedure.  The benefits include less needle sticks, lab draws from the catheter and patient may be discharged home with the catheter.  Risks include, but not limited to, infection, bleeding, blood clot (thrombus formation), and puncture of an artery; nerve damage and irregular heat beat.  Alternatives to this procedure were also discussed.  PICC/Midline Placement Documentation  PICC Single Lumen [id number] PICC Left Basilic 41 cm 0 cm (Active)  Indication for Insertion or Continuance of Line Home intravenous therapies (PICC only) 12/01/2015  9:16 AM  Exposed Catheter (cm) 0 cm 12/01/2015  9:16 AM  Site Assessment Clean;Dry;Intact 12/01/2015  9:16 AM  Line Status Flushed;Saline locked;Blood return noted 12/01/2015  9:16 AM  Dressing Type Transparent 12/01/2015  9:16 AM  Dressing Status Clean;Dry;Intact 12/01/2015  9:16 AM  Dressing Change Due 12/08/15 12/01/2015  9:16 AM       Gordan Payment 12/01/2015, 9:17 AM

## 2015-12-01 NOTE — Progress Notes (Signed)
4 Days Post-Op  Subjective: She did better today, she cried in the shower taking out the dressing.  She did dressing change with 1 mg of dilaudid.  Both sites show marked improvement.  The site on the abdomen I have been concerned about is still indurated but looks allot better.  Erythema has resolved.    Objective: Vital signs in last 24 hours: Temp:  [98.8 F (37.1 C)-100.2 F (37.9 C)] 99.1 F (37.3 C) (08/11 1344) Pulse Rate:  [88-98] 98 (08/11 1344) Resp:  [16] 16 (08/11 1344) BP: (117-133)/(62-86) 133/86 (08/11 1344) SpO2:  [93 %-96 %] 94 % (08/11 1344) Last BM Date: 11/30/15  Intake/Output from previous day: 08/10 0701 - 08/11 0700 In: 3910 [P.O.:1160; I.V.:2600; IV Piggyback:150] Out: 1300 [Urine:1300] Intake/Output this shift: Total I/O In: 240 [P.O.:240] Out: 0   General appearance: alert, cooperative, no distress and remains anxious Incision/Wound:  Both look better, depth is less, less purulence, both are pink and making good progress.  Taking less packing each time.    Lab Results:   Recent Labs  11/30/15 0411  WBC 13.9*  HGB 9.1*  HCT 27.6*  PLT 523*    BMET  Recent Labs  11/29/15 0430 11/30/15 0411  NA 138 137  K 3.8 4.2  CL 106 105  CO2 26 25  GLUCOSE 98 99  BUN 9 8  CREATININE 1.05* 0.88  CALCIUM 7.3* 7.6*   PT/INR No results for input(s): LABPROT, INR in the last 72 hours.  No results for input(s): AST, ALT, ALKPHOS, BILITOT, PROT, ALBUMIN in the last 168 hours.   Lipase     Component Value Date/Time   LIPASE 22 09/16/2014 0515     Studies/Results: No results found.  Medications: . buPROPion  150 mg Oral Daily  . celecoxib  200 mg Oral QHS  . enoxaparin (LOVENOX) injection  40 mg Subcutaneous Q24H  . estradiol  1 mg Oral QHS  . feeding supplement (ENSURE ENLIVE)  237 mL Oral Q24H  . feeding supplement (PRO-STAT SUGAR FREE 64)  30 mL Oral Daily  . lip balm  1 application Topical BID  . multivitamin with minerals  1 tablet  Oral Daily  . progesterone  100 mg Oral QHS  . senna  1 tablet Oral BID  . sodium chloride flush  10-40 mL Intracatheter Q12H  . vancomycin  750 mg Intravenous Q12H  . vitamin C  500 mg Oral BID    Assessment/Plan RIGHT AXILLA AND ABDOMINAL WallABCESS -   IRRIGATION AND DEBRIDEMENT RIGHT AXILLARY ABSCESS AND ABDOMINAL WALL,11/27/15, Dr. Lucia Gaskins  Cultures revealed MRSA   Sepsis resolved. S/p I&D vulvula &axilla - 11/16/15 Dr. Philis Pique MRSA- from [place] lesion 11/20/15 Fen: Regular/IV fluids ID: Day 8IV antibiotics - on vancomycin  DVT: Lovenox   Plan:  Continue BID dressing changes till she can do it at home.      LOS: 7 days    JENNINGS,WILLARD 12/01/2015 [phone]  Agree with above.  The problem Ms. Galas faces is who will help her at home with the dressing changes.  Her husband has a job in [place] and is up here only briefly.  She has a daughter in her 67's who she cannot count on. Home health care can come by, but I find they can usually not come by frequently enough.  She needs at least BID dressing changes.  Someone has raised the possibility of a rehab facility.  Her wounds are doing better.  Dr. Baxter Flattery is recommending a  3 week course (total) of Vancomycin - so Ms. Willmore will need 10 more days of PICC (end date 8/21).  I will see her in the office weekly until the wounds are healed.  Alphonsa Overall, MD, [facility] Pager: [phone] Office phone:  [phone]

## 2015-12-01 NOTE — Progress Notes (Signed)
Nutrition Follow-up  DOCUMENTATION CODES:   Obesity unspecified  INTERVENTION:  -Continue Ensure Enlive po q24h, each supplement provides 350 kcal and 20 grams of protein -Continue Pro-Stat 50mL po q24h, provides 15 gm of protein and 100 calories -Continue MV with Minerals  NUTRITION DIAGNOSIS:   Predicted suboptimal nutrient intake related to poor appetite as evidenced by per patient/family report, meal completion < 25%. -resolving  GOAL:   Patient will meet greater than or equal to 90% of their needs -progressing  MONITOR:   PO intake, Skin, I & O's, Labs  REASON FOR ASSESSMENT:   Malnutrition Screening Tool    ASSESSMENT:   60 year old with past medical history of obesity, MRSA cellulitis came in with recurrent valvular abscess, right axillary abscess and suprapubic, ID to evaluate.  Pt still having significant pain, but PO improving, 100% intake this morning for lunch --> scrambled eggs, potatoes, sausage, bacon, and toast. 4 days s/p I&D of mutiple abscesses No new wts. Continues to speak fevers on vanc No N/V/D/C at this time. Labs and Medications reviewed: Senokot, Vitamin C NS @ 163mL/hr  Diet Order:  Diet regular Room service appropriate? Yes; Fluid consistency: Thin  Skin:  Wound (see comment) (cellulitis of abdomen, incision on peritoneum)  Last BM:  7/30  Height:   Ht Readings from Last 1 Encounters:  11/23/15 5' (1.524 m)    Weight:   Wt Readings from Last 1 Encounters:  11/23/15 179 lb (81.2 kg)    Ideal Body Weight:  45.5 kg  BMI:  Body mass index is 34.96 kg/m.  Estimated Nutritional Needs:   Kcal:  1600-1800  Protein:  90-100 grams  Fluid:  1.8 L/day  EDUCATION NEEDS:   No education needs identified at this time  Ebony Anis. [place] Forker, MS, RD LDN Inpatient Clinical Dietitian Pager [phone]

## 2015-12-01 NOTE — Progress Notes (Signed)
PROGRESS NOTE  Ebony Stanley  [id number] DOB: [birth year] DOA: 11/23/2015 PCP: No primary care provider on file.   Brief Narrative:  60 year old with past medical history of obesity, MRSA cellulitis came in with recurrent valvular abscess, right axillary abscess and suprapubic, ID to evaluate. Abscesses were drained by surgery and patient is on Vancomycin. Culture grew MRSA.  Assessment & Plan:  Principal Problem:   Cellulitis of right axilla Active Problems:   Depression   Abscess of vulva s/p I&D   Cellulitis of abdominal wall   Obesity (BMI 30-39.9)   Hypokalemia   Leukocytosis   MRSA (methicillin resistant staph aureus) culture positive   Cellulitis   Sepsis ([place])   Abscess   Cellulitis, MRSA Abscess, MRSA Culture of wound grew MRSA sensitive to Vancomycin and Clindamycin -Continue IV Vancomycin per ID recommendations even when discharge for a 3 week course (8/3>>8/23) -Blood culture negative (Final) -Pain still requiring IV medications during dressing changes  Abscess of the vulva stable -Has 3 different abscesses in her vulva, this was seated and drained by OB/GYN. -Grew MRSA.  Hypokalemia Resolved  Depression -Continue Wellbutrin  Sepsis Resolved. Present on admission, Temperature of 102.1, WBCs of 25.5 on admission and presence of infection. -s/p I&D, however, will continue vancomycin as she is still spiking fevers.  DVT prophylaxis: Subcutaneous heparin Code Status: Full Code Family Communication:  Disposition Plan: pending improvement in pain, home on oral antibiotics. Diet: Diet regular Room service appropriate? Yes; Fluid consistency: Thin  Consultants:   Gen. surgery.  Infectious disease.  Procedures:   PICC Line (8/11>>8/23  Antimicrobials:   Vancomycin (8/3>>8/23  Subjective: Pain continues to be well controlled as long as she is not getting dressing/packing changes. No fevers in the last 24 hours.  Objective: Vitals:   11/30/15 1414 11/30/15 2258 12/01/15 0253 12/01/15 0605  BP: 109/72 128/62  117/67  Pulse: 95 95  88  Resp: 16 16  16   Temp: 99.8 F (37.7 C) 100.2 F (37.9 C) 98.8 F (37.1 C) 99.3 F (37.4 C)  TempSrc: Oral Oral Oral Oral  SpO2: 98% 96%  93%  Weight:      Height:        Intake/Output Summary (Last 24 hours) at 12/01/15 1225 Last data filed at 12/01/15 0938  Gross per 24 hour  Intake             3790 ml  Output             1300 ml  Net             2490 ml   Filed Weights   11/23/15 2245  Weight: 81.2 kg (179 lb)    Examination: General exam: Appears calm and comfortable. Respiratory system: Clear to auscultation. Respiratory effort normal. Cardiovascular system: S1 & S2 heard, RRR. No JVD, murmurs, rubs, gallops or clicks. No pedal edema. Gastrointestinal system: Abdomen is nondistended, soft and nontender. No organomegaly or masses felt. Normal bowel sounds heard. Central nervous system: Alert and oriented. No focal neurological deficits. Extremities: Symmetric 5 x 5 power. Skin: dressing under right axilla dry and intact. Dressing of lower abdomen with serosanguinous drainage that is not active Psychiatry: Judgement and insight appear normal. Mood & affect appropriate.   Data Reviewed: I have personally reviewed following labs and imaging studies  CBC:  Recent Labs Lab 11/25/15 0452 11/26/15 0455 11/28/15 0501 11/30/15 0411  WBC 17.6* 17.5* 15.0* 13.9*  HGB 11.4* 10.9* 9.8* 9.1*  HCT 32.4* 32.3* 28.9*  27.6*  MCV 89.3 91.5 92.9 92.0  PLT 487* 475* 462* 0000000*   Basic Metabolic Panel:  Recent Labs Lab 11/25/15 0452 11/26/15 0455 11/27/15 0449 11/28/15 0501 11/29/15 0430 11/30/15 0411  NA 136 140 134* 137 138 137  K 2.9* 5.3* 3.6 4.1 3.8 4.2  CL 100* 109 103 105 106 105  CO2 26 26 25 27 26 25   GLUCOSE 108* 109* 106* 94 98 99  BUN 12 10 9 11 9 8   CREATININE 1.14* 1.11* 1.10* 1.09* 1.05* 0.88  CALCIUM 8.0* 7.6* 7.1* 7.3* 7.3* 7.6*  MG 2.2  --   --    --   --   --    GFR: Estimated Creatinine Clearance: 65 mL/min (by C-G formula based on SCr of 0.88 mg/dL). Liver Function Tests: No results for input(s): AST, ALT, ALKPHOS, BILITOT, PROT, ALBUMIN in the last 168 hours. No results for input(s): LIPASE, AMYLASE in the last 168 hours. No results for input(s): AMMONIA in the last 168 hours. Coagulation Profile: No results for input(s): INR, PROTIME in the last 168 hours. Cardiac Enzymes: No results for input(s): CKTOTAL, CKMB, CKMBINDEX, TROPONINI in the last 168 hours. BNP (last 3 results) No results for input(s): PROBNP in the last 8760 hours. HbA1C: No results for input(s): HGBA1C in the last 72 hours. CBG: No results for input(s): GLUCAP in the last 168 hours. Lipid Profile: No results for input(s): CHOL, HDL, LDLCALC, TRIG, CHOLHDL, LDLDIRECT in the last 72 hours. Thyroid Function Tests: No results for input(s): TSH, T4TOTAL, FREET4, T3FREE, THYROIDAB in the last 72 hours. Anemia Panel: No results for input(s): VITAMINB12, FOLATE, FERRITIN, TIBC, IRON, RETICCTPCT in the last 72 hours. Urine analysis:    Component Value Date/Time   COLORURINE YELLOW 09/16/2014 0714   APPEARANCEUR CLOUDY (A) 09/16/2014 0714   LABSPEC 1.024 09/16/2014 0714   PHURINE 6.0 09/16/2014 0714   GLUCOSEU NEGATIVE 09/16/2014 0714   HGBUR LARGE (A) 09/16/2014 0714   BILIRUBINUR NEGATIVE 09/16/2014 0714   BILIRUBINUR n 11/30/2012 1256   KETONESUR 40 (A) 09/16/2014 0714   PROTEINUR NEGATIVE 09/16/2014 0714   UROBILINOGEN 0.2 09/16/2014 0714   NITRITE NEGATIVE 09/16/2014 0714   LEUKOCYTESUR NEGATIVE 09/16/2014 0714    Recent Results (from the past 240 hour(s))  Culture, blood (routine x 2)     Status: None   Collection Time: 11/23/15 10:33 PM  Result Value Ref Range Status   Specimen Description BLOOD LEFT HAND  Final   Special Requests BOTTLES DRAWN AEROBIC AND ANAEROBIC 3CC EACH  Final   Culture   Final    NO GROWTH 5 DAYS Performed at [facility]    Report Status 11/29/2015 FINAL  Final  Culture, blood (routine x 2)     Status: None   Collection Time: 11/24/15  2:00 AM  Result Value Ref Range Status   Specimen Description BLOOD LEFT ARM  Final   Special Requests   Final    BOTTLES DRAWN AEROBIC AND ANAEROBIC 9 CC BOTH BOTTLES   Culture   Final    NO GROWTH 5 DAYS Performed at [facility]    Report Status 11/29/2015 FINAL  Final  Surgical pcr screen     Status: Abnormal   Collection Time: 11/27/15 12:59 PM  Result Value Ref Range Status   MRSA, PCR INVALID RESULTS, SPECIMEN SENT FOR CULTURE (A) NEGATIVE Final   Staphylococcus aureus INVALID RESULTS, SPECIMEN SENT FOR CULTURE (A) NEGATIVE Final    Comment:  The Xpert SA Assay (FDA approved for NASAL specimens in patients over 82 years of age), is one component of a comprehensive surveillance program.  Test performance has been validated by [facility] for patients greater than or equal to 25 year old. It is not intended to diagnose infection nor to guide or monitor treatment.   MRSA culture     Status: None   Collection Time: 11/27/15  1:00 PM  Result Value Ref Range Status   Specimen Description NOSE  Final   Special Requests NONE  Final   Culture   Final    NO GROWTH 2 DAYS Performed at [facility]    Report Status 11/29/2015 FINAL  Final  Aerobic/Anaerobic Culture (surgical/deep wound)     Status: None (Preliminary result)   Collection Time: 11/27/15  2:11 PM  Result Value Ref Range Status   Specimen Description ABSCESS ABDOMINAL WALL  Final   Special Requests NONE  Final   Gram Stain   Final    FEW WBC PRESENT, PREDOMINANTLY PMN FEW GRAM POSITIVE COCCI IN CLUSTERS Performed at [facility]    Culture   Final    MODERATE METHICILLIN RESISTANT STAPHYLOCOCCUS AUREUS NO ANAEROBES ISOLATED; CULTURE IN PROGRESS FOR 5 DAYS    Report Status PENDING  Incomplete   Organism ID, Bacteria METHICILLIN RESISTANT  STAPHYLOCOCCUS AUREUS  Final      Susceptibility   Methicillin resistant staphylococcus aureus - MIC*    CIPROFLOXACIN >=8 RESISTANT Resistant     ERYTHROMYCIN >=8 RESISTANT Resistant     GENTAMICIN <=0.5 SENSITIVE Sensitive     OXACILLIN >=4 RESISTANT Resistant     TETRACYCLINE <=1 SENSITIVE Sensitive     VANCOMYCIN <=0.5 SENSITIVE Sensitive     TRIMETH/SULFA <=10 SENSITIVE Sensitive     CLINDAMYCIN <=0.25 SENSITIVE Sensitive     RIFAMPIN <=0.5 SENSITIVE Sensitive     Inducible Clindamycin NEGATIVE Sensitive     * MODERATE METHICILLIN RESISTANT STAPHYLOCOCCUS AUREUS     Invalid input(s): PROCALCITONIN, LACTICACIDVEN   Radiology Studies: No results found.    Scheduled Meds: . buPROPion  150 mg Oral Daily  . celecoxib  200 mg Oral QHS  . enoxaparin (LOVENOX) injection  40 mg Subcutaneous Q24H  . estradiol  1 mg Oral QHS  . feeding supplement (ENSURE ENLIVE)  237 mL Oral Q24H  . feeding supplement (PRO-STAT SUGAR FREE 64)  30 mL Oral Daily  . lip balm  1 application Topical BID  . multivitamin with minerals  1 tablet Oral Daily  . progesterone  100 mg Oral QHS  . senna  1 tablet Oral BID  . sodium chloride flush  10-40 mL Intracatheter Q12H  . vancomycin  750 mg Intravenous Q12H  . vitamin C  500 mg Oral BID   Continuous Infusions: . sodium chloride 100 mL/hr at 12/01/15 0250     LOS: 7 days    Cordelia Poche, MD [facility] Pager [phone]  If 7PM-7AM, please contact night-coverage [email or web address] Password [facility] 12/01/2015, 12:25 PM

## 2015-12-01 NOTE — Progress Notes (Signed)
Pharmacy Antibiotic Follow-up Note  Ebony Stanley is a 60 y.o. year-old female admitted on 11/23/2015.  The patient is currently on day 7 of Vancomycin for MRSA cellulitis.  -8/7 s/p I/D right axillary abscess and abd wall (100 mL purulence from each site) -8/7 Abd wall abscess cx: MRSA -Continues to have low grade fevers -Dressing changes extremely painful for pt -ID following, recommend to continue IV vancomycin to complete 3 weeks of therapy due to burden of disease, orders for PICC   Assessment/Plan: Continue Vancomycin 750mg  IV q12h (goal trough 15-68mcg/mL) -Recheck trough tomorrow AM at [facility] -Consider changing Vancomycin to q24h regimen depending on trough results for ease of administration with home health -Check BMET in AM   Temp (24hrs), Avg:99.5 F (37.5 C), Min:98.8 F (37.1 C), Max:100.2 F (37.9 C)   Recent Labs Lab 11/25/15 0452 11/26/15 0455 11/28/15 0501 11/30/15 0411  WBC 17.6* 17.5* 15.0* 13.9*     Recent Labs Lab 11/26/15 0455 11/27/15 0449 11/28/15 0501 11/29/15 0430 11/30/15 0411  CREATININE 1.11* 1.10* 1.09* 1.05* 0.88   Estimated Creatinine Clearance: 65 mL/min (by C-G formula based on SCr of 0.88 mg/dL).    Allergies  Allergen Reactions  . Kenalog [Triamcinolone] Anaphylaxis    Was mixed with lidocaine, unsure which caused the reaction   . Lidocaine Anaphylaxis    Was mixed with the Kenalog, unsure which medication caused the reaction Pt repots she received Lidocaine since that time without trouble.   Antimicrobials this admission: 8/3 Vancomycin  >>  8/3 Zosyn >> 8/4 8/4 Clinda IV >> 8/5 (3 doses/ID)  Levels/dose changes this admission: 8/6 @ 3pm VT= 18on Vanc 1g IV q12h (prior to 7th dose) - no change 8/9 @ 3PM VT = 25 on Vanc 1g IV q12h - change to 750mg  q12h 8/12 @ 0330 VT = _____  Microbiology results: 7/31 vulva lesions: MRSA (S- clinda, gent, bactrim, tetracycline, R - cipro, erythromycin, oxacillin) 7/31 HIV: NR 8/4 BCx:  NGTD 8/7 Abd wall abscess cx: Moderate staph aureus - pending 8/7 MRSA PCR cx: NGF  Thank you for allowing pharmacy to be a part of this patient's care.  Ralene Bathe, PharmD, BCPS 12/01/2015, 7:44 AM  Pager: [phone]

## 2015-12-02 LAB — BASIC METABOLIC PANEL
Anion gap: 6 (ref 5–15)
BUN: 8 mg/dL (ref 6–20)
CALCIUM: 7.9 mg/dL — AB (ref 8.9–10.3)
CO2: 28 mmol/L (ref 22–32)
CREATININE: 0.98 mg/dL (ref 0.44–1.00)
Chloride: 104 mmol/L (ref 101–111)
GFR calc Af Amer: >60 mL/min (ref >60)
GFR calc non Af Amer: >60 mL/min (ref >60)
GLUCOSE: 106 mg/dL — AB (ref 65–99)
Potassium: 3.7 mmol/L (ref 3.5–5.1)
Sodium: 138 mmol/L (ref 135–145)

## 2015-12-02 LAB — CBC
HCT: 25.9 % — ABNORMAL LOW (ref 36.0–46.0)
Hemoglobin: 8.9 g/dL — ABNORMAL LOW (ref 12.0–15.0)
MCH: 31.6 pg (ref 26.0–34.0)
MCHC: 34.4 g/dL (ref 30.0–36.0)
MCV: 91.8 fL (ref 78.0–100.0)
PLATELETS: 584 10*3/uL — AB (ref 150–400)
RBC: 2.82 MIL/uL — ABNORMAL LOW (ref 3.87–5.11)
RDW: 15 % (ref 11.5–15.5)
WBC: 12.5 10*3/uL — ABNORMAL HIGH (ref 4.0–10.5)

## 2015-12-02 LAB — AEROBIC/ANAEROBIC CULTURE W GRAM STAIN (SURGICAL/DEEP WOUND)

## 2015-12-02 LAB — VANCOMYCIN, TROUGH: Vancomycin Tr: 18 ug/mL (ref 15–20)

## 2015-12-02 LAB — AEROBIC/ANAEROBIC CULTURE (SURGICAL/DEEP WOUND)

## 2015-12-02 NOTE — Progress Notes (Signed)
Pharmacy Antibiotic Follow-up Note  Ebony Stanley is a 60 y.o. year-old female admitted on 11/23/2015.  The patient is currently on day 8 of Vancomycin for MRSA cellulitis.  -8/7 s/p I/D right axillary abscess and abd wall (100 mL purulence from each site) -8/7 Abd wall abscess cx: MRSA -Continues to have low grade fevers -Dressing changes extremely painful for pt -ID following, recommend to continue IV vancomycin to complete 3 weeks of therapy due to burden of disease, orders for PICC   Assessment/Plan: - Vancomycin trough level = 18 mcg/ml Continue Vancomycin 750mg  IV q12h (goal trough 15-64mcg/mL)  Temp (24hrs), Avg:99.6 F (37.6 C), Min:99.1 F (37.3 C), Max:100.5 F (38.1 C)   Recent Labs Lab 11/26/15 0455 11/28/15 0501 11/30/15 0411 12/02/15 0330  WBC 17.5* 15.0* 13.9* 12.5*     Recent Labs Lab 11/27/15 0449 11/28/15 0501 11/29/15 0430 11/30/15 0411 12/02/15 0330  CREATININE 1.10* 1.09* 1.05* 0.88 0.98   Estimated Creatinine Clearance: 58.4 mL/min (by C-G formula based on SCr of 0.98 mg/dL).    Allergies  Allergen Reactions  . Kenalog [Triamcinolone] Anaphylaxis    Was mixed with lidocaine, unsure which caused the reaction   . Lidocaine Anaphylaxis    Was mixed with the Kenalog, unsure which medication caused the reaction Pt repots she received Lidocaine since that time without trouble.   Antimicrobials this admission: 8/3 Vancomycin  >>  8/3 Zosyn >> 8/4 8/4 Clinda IV >> 8/5 (3 doses/ID)  Levels/dose changes this admission: 8/6 @ 3pm VT= 18on Vanc 1g IV q12h (prior to 7th dose) - no change 8/9 @ 3PM VT = 25 on Vanc 1g IV q12h - change to 750mg  q12h 8/12 @ 0330 VT = 18 ON 750mg  IV q12h - no change  Microbiology results: 7/31 vulva lesions: MRSA (S- clinda, gent, bactrim, tetracycline, R - cipro, erythromycin, oxacillin) 7/31 HIV: NR 8/4 BCx: NGTD 8/7 Abd wall abscess cx: Moderate staph aureus - MRSA 8/7 MRSA PCR cx: NGF  Thank you for allowing  pharmacy to be a part of this patient's care.  Leone Haven, PharmD 12/02/2015, 4:56 Stanley

## 2015-12-02 NOTE — Care Management Note (Signed)
Case Management Note  Patient Details  Name: Ebony Stanley MRN: [id number] Date of Birth: [birth year]  Subjective/Objective:  MRSA cellulitis of recurrent valvular abscess, right axillary absces and suprapubic                Action/Plan: Discharge Planning:  NCM spoke to pt and states her dtr is 61 years old and does not feel comfortable with changing dressings and administering IV abx at home. Pt will need Vancomycin IV at home until 12/11/2015. Will need dressing changes to wounds. Her husband was recently transferred to [place] for his job. States she is interested in going to SNF-rehab if insurance will approve. Contacted CSW for placement. HH is arranged with [facility].    Expected Discharge Date:             Expected Discharge Plan:  [place]  In-House Referral:  NA  Discharge planning Services  CM Consult  Post Acute Care Choice:  Home Health Choice offered to:  Patient  DME Arranged:  N/A DME Agency:  NA  HH Arranged:  RN, IV Antibiotics HH Agency:  [place]  Status of Service:  In process, will continue to follow  If discussed at Long Length of Stay Meetings, dates discussed:    Additional Comments:  Erenest Rasher, RN 12/02/2015, 2:34 PM

## 2015-12-02 NOTE — Progress Notes (Signed)
PROGRESS NOTE  Ebony Stanley  [id number] DOB: [birth year] DOA: 11/23/2015 PCP: No primary care provider on file.   Brief Narrative:  60 year old with past medical history of obesity, MRSA cellulitis came in with recurrent valvular abscess, right axillary abscess and suprapubic, ID to evaluate. Abscesses were drained by surgery and patient is on Vancomycin. Culture grew MRSA.  Assessment & Plan:  Principal Problem:   Cellulitis of right axilla Active Problems:   Depression   Abscess of vulva s/p I&D   Cellulitis of abdominal wall   Obesity (BMI 30-39.9)   Hypokalemia   Leukocytosis   MRSA (methicillin resistant staph aureus) culture positive   Cellulitis   Sepsis ([place])   Abscess   Cellulitis, MRSA Abscess, MRSA Culture of wound grew MRSA sensitive to Vancomycin. Mild fever overnight. Leukocytosis improving -Continue IV Vancomycin per ID recommendations even when discharge for a 3 week course (8/3>>8/21).  -Blood culture negative (Final) -Pain still requiring IV medications during dressing changes -Will work on discharge to SNF for continued IV antibiotics and wound care as she has little support at home to have her dressings changed twice per day.  Anemia Hemoglobin appears to be trending down. No obvious sources of bleeding. We'll continue to monitor. -FOBT -CBC in a.m.  Abscess of the vulva stable -Has 3 different abscesses in her vulva, this was seated and drained by OB/GYN. -Grew MRSA.  Hypokalemia Resolved  Depression -Continue Wellbutrin  Sepsis Resolved. Present on admission, Temperature of 102.1, WBCs of 25.5 on admission and presence of infection. Blood cultures negative. Likely secondary to abscesses.  DVT prophylaxis: Subcutaneous heparin Code Status: Full Code Family Communication:  Disposition Plan: Likely SNF  Diet: Diet regular Room service appropriate? Yes; Fluid consistency: Thin  Consultants:   Gen. surgery.  Infectious  disease.  Procedures:   PICC Line (8/11>>  Antimicrobials:   Vancomycin (8/3>>8/21  Subjective: Patient states the pain is still present. Pain is worse with dressing changes. Otherwise she feels fine.  Objective: Vitals:   12/01/15 0605 12/01/15 1344 12/01/15 2204 12/02/15 0620  BP: 117/67 133/86 137/69 (!) 140/48  Pulse: 88 98 (!) 103 86  Resp: 16 16 16 16   Temp: 99.3 F (37.4 C) 99.1 F (37.3 C) (!) 100.5 F (38.1 C) 98.8 F (37.1 C)  TempSrc: Oral Oral Oral Oral  SpO2: 93% 94% 95% 98%  Weight:      Height:        Intake/Output Summary (Last 24 hours) at 12/02/15 1207 Last data filed at 12/02/15 1041  Gross per 24 hour  Intake              120 ml  Output                0 ml  Net              120 ml   Filed Weights   11/23/15 2245  Weight: 81.2 kg (179 lb)    Examination: General exam: Appears calm and comfortable. Respiratory system: Clear to auscultation. Respiratory effort normal. Cardiovascular system: S1 & S2 heard, RRR. No JVD, murmurs, rubs, gallops or clicks. No pedal edema. Gastrointestinal system: Abdomen is nondistended, soft and nontender. No organomegaly or masses felt. Normal bowel sounds heard. Central nervous system: Alert and oriented. No focal neurological deficits. Extremities: Symmetric 5 x 5 power. Skin: Dressings under right axilla and over abdomen are dry and intact Psychiatry: Judgement and insight appear normal. Mood & affect appropriate.   Data Reviewed:  I have personally reviewed following labs and imaging studies  CBC:  Recent Labs Lab 11/26/15 0455 11/28/15 0501 11/30/15 0411 12/02/15 0330  WBC 17.5* 15.0* 13.9* 12.5*  HGB 10.9* 9.8* 9.1* 8.9*  HCT 32.3* 28.9* 27.6* 25.9*  MCV 91.5 92.9 92.0 91.8  PLT 475* 462* 523* [id number]*   Basic Metabolic Panel:  Recent Labs Lab 11/27/15 0449 11/28/15 0501 11/29/15 0430 11/30/15 0411 12/02/15 0330  NA 134* 137 138 137 138  K 3.6 4.1 3.8 4.2 3.7  CL 103 105 106 105 104  CO2  25 27 26 25 28   GLUCOSE 106* 94 98 99 106*  BUN 9 11 9 8 8   CREATININE 1.10* 1.09* 1.05* 0.88 0.98  CALCIUM 7.1* 7.3* 7.3* 7.6* 7.9*   GFR: Estimated Creatinine Clearance: 58.4 mL/min (by C-G formula based on SCr of 0.98 mg/dL). Liver Function Tests: No results for input(s): AST, ALT, ALKPHOS, BILITOT, PROT, ALBUMIN in the last 168 hours. No results for input(s): LIPASE, AMYLASE in the last 168 hours. No results for input(s): AMMONIA in the last 168 hours. Coagulation Profile: No results for input(s): INR, PROTIME in the last 168 hours. Cardiac Enzymes: No results for input(s): CKTOTAL, CKMB, CKMBINDEX, TROPONINI in the last 168 hours. BNP (last 3 results) No results for input(s): PROBNP in the last 8760 hours. HbA1C: No results for input(s): HGBA1C in the last 72 hours. CBG: No results for input(s): GLUCAP in the last 168 hours. Lipid Profile: No results for input(s): CHOL, HDL, LDLCALC, TRIG, CHOLHDL, LDLDIRECT in the last 72 hours. Thyroid Function Tests: No results for input(s): TSH, T4TOTAL, FREET4, T3FREE, THYROIDAB in the last 72 hours. Anemia Panel: No results for input(s): VITAMINB12, FOLATE, FERRITIN, TIBC, IRON, RETICCTPCT in the last 72 hours. Urine analysis:    Component Value Date/Time   COLORURINE YELLOW 09/16/2014 0714   APPEARANCEUR CLOUDY (A) 09/16/2014 0714   LABSPEC 1.024 09/16/2014 0714   PHURINE 6.0 09/16/2014 0714   GLUCOSEU NEGATIVE 09/16/2014 0714   HGBUR LARGE (A) 09/16/2014 0714   BILIRUBINUR NEGATIVE 09/16/2014 0714   BILIRUBINUR n 11/30/2012 1256   KETONESUR 40 (A) 09/16/2014 0714   PROTEINUR NEGATIVE 09/16/2014 0714   UROBILINOGEN 0.2 09/16/2014 0714   NITRITE NEGATIVE 09/16/2014 0714   LEUKOCYTESUR NEGATIVE 09/16/2014 0714    Recent Results (from the past 240 hour(s))  Culture, blood (routine x 2)     Status: None   Collection Time: 11/23/15 10:33 PM  Result Value Ref Range Status   Specimen Description BLOOD LEFT HAND  Final   Special  Requests BOTTLES DRAWN AEROBIC AND ANAEROBIC 3CC EACH  Final   Culture   Final    NO GROWTH 5 DAYS Performed at [facility]    Report Status 11/29/2015 FINAL  Final  Culture, blood (routine x 2)     Status: None   Collection Time: 11/24/15  2:00 AM  Result Value Ref Range Status   Specimen Description BLOOD LEFT ARM  Final   Special Requests   Final    BOTTLES DRAWN AEROBIC AND ANAEROBIC 9 CC BOTH BOTTLES   Culture   Final    NO GROWTH 5 DAYS Performed at [facility]    Report Status 11/29/2015 FINAL  Final  Surgical pcr screen     Status: Abnormal   Collection Time: 11/27/15 12:59 PM  Result Value Ref Range Status   MRSA, PCR INVALID RESULTS, SPECIMEN SENT FOR CULTURE (A) NEGATIVE Final   Staphylococcus aureus INVALID RESULTS, SPECIMEN SENT FOR  CULTURE (A) NEGATIVE Final    Comment:        The Xpert SA Assay (FDA approved for NASAL specimens in patients over 77 years of age), is one component of a comprehensive surveillance program.  Test performance has been validated by [facility] for patients greater than or equal to 53 year old. It is not intended to diagnose infection nor to guide or monitor treatment.   MRSA culture     Status: None   Collection Time: 11/27/15  1:00 PM  Result Value Ref Range Status   Specimen Description NOSE  Final   Special Requests NONE  Final   Culture   Final    NO GROWTH 2 DAYS Performed at [facility]    Report Status 11/29/2015 FINAL  Final  Aerobic/Anaerobic Culture (surgical/deep wound)     Status: None (Preliminary result)   Collection Time: 11/27/15  2:11 PM  Result Value Ref Range Status   Specimen Description ABSCESS ABDOMINAL WALL  Final   Special Requests NONE  Final   Gram Stain   Final    FEW WBC PRESENT, PREDOMINANTLY PMN FEW GRAM POSITIVE COCCI IN CLUSTERS Performed at [facility]    Culture   Final    MODERATE METHICILLIN RESISTANT STAPHYLOCOCCUS AUREUS NO ANAEROBES ISOLATED;  CULTURE IN PROGRESS FOR 5 DAYS    Report Status PENDING  Incomplete   Organism ID, Bacteria METHICILLIN RESISTANT STAPHYLOCOCCUS AUREUS  Final      Susceptibility   Methicillin resistant staphylococcus aureus - MIC*    CIPROFLOXACIN >=8 RESISTANT Resistant     ERYTHROMYCIN >=8 RESISTANT Resistant     GENTAMICIN <=0.5 SENSITIVE Sensitive     OXACILLIN >=4 RESISTANT Resistant     TETRACYCLINE <=1 SENSITIVE Sensitive     VANCOMYCIN <=0.5 SENSITIVE Sensitive     TRIMETH/SULFA <=10 SENSITIVE Sensitive     CLINDAMYCIN <=0.25 SENSITIVE Sensitive     RIFAMPIN <=0.5 SENSITIVE Sensitive     Inducible Clindamycin NEGATIVE Sensitive     * MODERATE METHICILLIN RESISTANT STAPHYLOCOCCUS AUREUS     Invalid input(s): PROCALCITONIN, LACTICACIDVEN   Radiology Studies: No results found.    Scheduled Meds: . buPROPion  150 mg Oral Daily  . celecoxib  200 mg Oral QHS  . enoxaparin (LOVENOX) injection  40 mg Subcutaneous Q24H  . estradiol  1 mg Oral QHS  . feeding supplement (ENSURE ENLIVE)  237 mL Oral Q24H  . feeding supplement (PRO-STAT SUGAR FREE 64)  30 mL Oral Daily  . lip balm  1 application Topical BID  . multivitamin with minerals  1 tablet Oral Daily  . progesterone  100 mg Oral QHS  . senna  1 tablet Oral BID  . sodium chloride flush  10-40 mL Intracatheter Q12H  . vancomycin  750 mg Intravenous Q12H  . vitamin C  500 mg Oral BID   Continuous Infusions: . sodium chloride 100 mL/hr at 12/01/15 2221     LOS: 8 days    Cordelia Poche, MD [facility] Pager [phone]  If 7PM-7AM, please contact night-coverage [email or web address] Password [facility] 12/02/2015, 12:07 PM

## 2015-12-02 NOTE — Progress Notes (Signed)
5 Days Post-Op  Subjective: Still having pain with dressing changes.  Objective: Vital signs in last 24 hours: Temp:  [98.8 F (37.1 C)-100.5 F (38.1 C)] 98.8 F (37.1 C) (08/12 0620) Pulse Rate:  [86-103] 86 (08/12 0620) Resp:  [16] 16 (08/12 0620) BP: (133-140)/(48-86) 140/48 (08/12 0620) SpO2:  [94 %-98 %] 98 % (08/12 0620) Last BM Date: 11/30/15  Intake/Output from previous day: 08/11 0701 - 08/12 0700 In: 1360 [P.O.:360; I.V.:1000] Out: 0  Intake/Output this shift: No intake/output data recorded.  PE: Right axillary and abdominal wounds are clean with decreased induration  Lab Results:   Recent Labs  11/30/15 0411 12/02/15 0330  WBC 13.9* 12.5*  HGB 9.1* 8.9*  HCT 27.6* 25.9*  PLT 523* 584*   BMET  Recent Labs  11/30/15 0411 12/02/15 0330  NA 137 138  K 4.2 3.7  CL 105 104  CO2 25 28  GLUCOSE 99 106*  BUN 8 8  CREATININE 0.88 0.98  CALCIUM 7.6* 7.9*   PT/INR No results for input(s): LABPROT, INR in the last 72 hours. Comprehensive Metabolic Panel:    Component Value Date/Time   NA 138 12/02/2015 0330   NA 137 11/30/2015 0411   K 3.7 12/02/2015 0330   K 4.2 11/30/2015 0411   CL 104 12/02/2015 0330   CL 105 11/30/2015 0411   CO2 28 12/02/2015 0330   CO2 25 11/30/2015 0411   BUN 8 12/02/2015 0330   BUN 8 11/30/2015 0411   CREATININE 0.98 12/02/2015 0330   CREATININE 0.88 11/30/2015 0411   CREATININE 0.70 11/30/2012 1355   CREATININE 0.56 05/23/2011 1534   GLUCOSE 106 (H) 12/02/2015 0330   GLUCOSE 99 11/30/2015 0411   CALCIUM 7.9 (L) 12/02/2015 0330   CALCIUM 7.6 (L) 11/30/2015 0411   AST 25 09/16/2014 0515   AST 29 11/30/2012 1355   ALT 30 09/16/2014 0515   ALT 32 11/30/2012 1355   ALKPHOS 51 09/16/2014 0515   ALKPHOS 61 11/30/2012 1355   BILITOT 0.2 (L) 09/16/2014 0515   BILITOT 0.4 11/30/2012 1355   PROT 7.5 09/16/2014 0515   PROT 6.6 11/30/2012 1355   ALBUMIN 4.4 09/16/2014 0515   ALBUMIN 4.3 11/30/2012 1355      Studies/Results: No results found.  Anti-infectives: Anti-infectives    Start     Dose/Rate Route Frequency Ordered Stop   11/30/15 1600  vancomycin (VANCOCIN) IVPB 750 mg/150 ml premix     750 mg 150 mL/hr over 60 Minutes Intravenous Every 12 hours 11/29/15 1644     11/24/15 1700  clindamycin (CLEOCIN) IVPB 600 mg     600 mg 100 mL/hr over 30 Minutes Intravenous Every 8 hours 11/24/15 1607 11/25/15 1005   11/24/15 0600  piperacillin-tazobactam (ZOSYN) IVPB 3.375 g  Status:  Discontinued     3.375 g 12.5 mL/hr over 240 Minutes Intravenous Every 8 hours 11/23/15 2118 11/24/15 1607   11/24/15 0400  vancomycin (VANCOCIN) IVPB 1000 mg/200 mL premix  Status:  Discontinued     1,000 mg 200 mL/hr over 60 Minutes Intravenous Every 12 hours 11/23/15 2117 11/29/15 1644   11/23/15 2000  piperacillin-tazobactam (ZOSYN) IVPB 3.375 g     3.375 g 100 mL/hr over 30 Minutes Intravenous  Once 11/23/15 1957 11/23/15 2152   11/23/15 1945  vancomycin (VANCOCIN) IVPB 1000 mg/200 mL premix     1,000 mg 200 mL/hr over 60 Minutes Intravenous  Once 11/23/15 1936 11/23/15 2123   11/23/15 1930  clindamycin (CLEOCIN) IVPB 600 mg  Status:  Discontinued     600 mg 100 mL/hr over 30 Minutes Intravenous  Once 11/23/15 1920 11/23/15 1936      Assessment RIGHT AXILLA AND ABDOMINAL WallABCESS -                        IRRIGATION AND DEBRIDEMENT RIGHT AXILLARY ABSCESS AND ABDOMINAL WALL,11/27/15, Dr. Lucia Gaskins                       Cultures revealed MRSA   LOS: 8 days   Plan: Continue wound care and Vancomycin (until 12/11/15).   Ziyan Hillmer J 12/02/2015

## 2015-12-03 LAB — CBC
HCT: 25.6 % — ABNORMAL LOW (ref 36.0–46.0)
HEMOGLOBIN: 8.6 g/dL — AB (ref 12.0–15.0)
MCH: 30.6 pg (ref 26.0–34.0)
MCHC: 33.6 g/dL (ref 30.0–36.0)
MCV: 91.1 fL (ref 78.0–100.0)
Platelets: 588 10*3/uL — ABNORMAL HIGH (ref 150–400)
RBC: 2.81 MIL/uL — AB (ref 3.87–5.11)
RDW: 14.7 % (ref 11.5–15.5)
WBC: 11.7 10*3/uL — ABNORMAL HIGH (ref 4.0–10.5)

## 2015-12-03 NOTE — Progress Notes (Signed)
PROGRESS NOTE  Ebony Stanley  [id number] DOB: Mar 02, 1956 DOA: 11/23/2015 PCP: No primary care provider on file.   Brief Narrative:  60 year old with past medical history of obesity, MRSA cellulitis came in with recurrent valvular abscess, right axillary abscess and suprapubic, ID to evaluate. Abscesses were drained by surgery and patient is on Vancomycin. Culture grew MRSA.  Assessment & Plan:  Principal Problem:   Cellulitis of right axilla Active Problems:   Depression   Abscess of vulva s/p I&D   Cellulitis of abdominal wall   Obesity (BMI 30-39.9)   Hypokalemia   Leukocytosis   MRSA (methicillin resistant staph aureus) culture positive   Cellulitis   Sepsis ([place])   Abscess   Cellulitis, MRSA Abscess, MRSA Culture of wound grew MRSA sensitive to Vancomycin. Mild fever overnight. Leukocytosis improving -Continue IV Vancomycin per ID recommendations even when discharge for a 3 week course (8/3>>8/21).  -Blood culture negative (Final) -Will work on discharge to SNF versus home with home health for continued IV antibiotics  Anemia Hemoglobin appears to be trending down. No obvious sources of bleeding. We'll continue to monitor. -FOBT -CBC in a.m.  Abscess of the vulva stable -Has 3 different abscesses in her vulva, this was seated and drained by OB/GYN. -Grew MRSA.  Hypokalemia Resolved  Depression -Continue Wellbutrin  Sepsis Resolved. Present on admission, Temperature of 102.1, WBCs of 25.5 on admission and presence of infection. Blood cultures negative. Likely secondary to abscesses.  DVT prophylaxis: Subcutaneous heparin Code Status: Full Code Family Communication:  Disposition Plan: Likely SNF versus home with home health  Diet: Diet regular Room service appropriate? Yes; Fluid consistency: Thin  Consultants:   Gen. surgery.  Infectious disease.  Procedures:   PICC Line (8/11>>  Antimicrobials:   Vancomycin  (8/3>>8/21  Subjective: Patient continues to have pain with dressings. She had not had a dressing change by the time I saw her. No other complaints.  Objective: Vitals:   12/02/15 1432 12/02/15 2215 12/03/15 0635 12/03/15 1500  BP: 131/60 (!) 143/66 (!) 120/58 135/74  Pulse: 91 90 87 88  Resp: 15 16 16 16   Temp: 98.2 F (36.8 C) 98.2 F (36.8 C) 98.8 F (37.1 C) 98.8 F (37.1 C)  TempSrc:  Oral Oral Oral  SpO2: 98% 98% 98% 95%  Weight:      Height:        Intake/Output Summary (Last 24 hours) at 12/03/15 1606 Last data filed at 12/03/15 1215  Gross per 24 hour  Intake             2520 ml  Output                0 ml  Net             2520 ml   Filed Weights   11/23/15 2245  Weight: 81.2 kg (179 lb)    Examination: General exam: Appears calm and comfortable. Respiratory system: Clear to auscultation. Respiratory effort normal. Cardiovascular system: S1 & S2 heard, RRR. No JVD, murmurs, rubs, gallops or clicks. No pedal edema. Gastrointestinal system: Abdomen is nondistended, soft and nontender. No organomegaly or masses felt. Normal bowel sounds heard. Central nervous system: Alert and oriented. No focal neurological deficits. Extremities: Symmetric 5 x 5 power. Skin: Dressings under right axilla and over abdomen are dry and intact Psychiatry: Judgement and insight appear normal. Mood & affect appropriate.   Data Reviewed: I have personally reviewed following labs and imaging studies  CBC:  Recent  Labs Lab 11/28/15 0501 11/30/15 0411 12/02/15 0330 12/03/15 0500  WBC 15.0* 13.9* 12.5* 11.7*  HGB 9.8* 9.1* 8.9* 8.6*  HCT 28.9* 27.6* 25.9* 25.6*  MCV 92.9 92.0 91.8 91.1  PLT 462* 523* 584* [id number]*   Basic Metabolic Panel:  Recent Labs Lab 11/27/15 0449 11/28/15 0501 11/29/15 0430 11/30/15 0411 12/02/15 0330  NA 134* 137 138 137 138  K 3.6 4.1 3.8 4.2 3.7  CL 103 105 106 105 104  CO2 25 27 26 25 28   GLUCOSE 106* 94 98 99 106*  BUN 9 11 9 8 8    CREATININE 1.10* 1.09* 1.05* 0.88 0.98  CALCIUM 7.1* 7.3* 7.3* 7.6* 7.9*   GFR: Estimated Creatinine Clearance: 58.4 mL/min (by C-G formula based on SCr of 0.98 mg/dL). Liver Function Tests: No results for input(s): AST, ALT, ALKPHOS, BILITOT, PROT, ALBUMIN in the last 168 hours. No results for input(s): LIPASE, AMYLASE in the last 168 hours. No results for input(s): AMMONIA in the last 168 hours. Coagulation Profile: No results for input(s): INR, PROTIME in the last 168 hours. Cardiac Enzymes: No results for input(s): CKTOTAL, CKMB, CKMBINDEX, TROPONINI in the last 168 hours. BNP (last 3 results) No results for input(s): PROBNP in the last 8760 hours. HbA1C: No results for input(s): HGBA1C in the last 72 hours. CBG: No results for input(s): GLUCAP in the last 168 hours. Lipid Profile: No results for input(s): CHOL, HDL, LDLCALC, TRIG, CHOLHDL, LDLDIRECT in the last 72 hours. Thyroid Function Tests: No results for input(s): TSH, T4TOTAL, FREET4, T3FREE, THYROIDAB in the last 72 hours. Anemia Panel: No results for input(s): VITAMINB12, FOLATE, FERRITIN, TIBC, IRON, RETICCTPCT in the last 72 hours. Urine analysis:    Component Value Date/Time   COLORURINE YELLOW 09/16/2014 0714   APPEARANCEUR CLOUDY (A) 09/16/2014 0714   LABSPEC 1.024 09/16/2014 0714   PHURINE 6.0 09/16/2014 0714   GLUCOSEU NEGATIVE 09/16/2014 0714   HGBUR LARGE (A) 09/16/2014 0714   BILIRUBINUR NEGATIVE 09/16/2014 0714   BILIRUBINUR n 11/30/2012 1256   KETONESUR 40 (A) 09/16/2014 0714   PROTEINUR NEGATIVE 09/16/2014 0714   UROBILINOGEN 0.2 09/16/2014 0714   NITRITE NEGATIVE 09/16/2014 0714   LEUKOCYTESUR NEGATIVE 09/16/2014 0714    Recent Results (from the past 240 hour(s))  Culture, blood (routine x 2)     Status: None   Collection Time: 11/23/15 10:33 PM  Result Value Ref Range Status   Specimen Description BLOOD LEFT HAND  Final   Special Requests BOTTLES DRAWN AEROBIC AND ANAEROBIC 3CC EACH  Final    Culture   Final    NO GROWTH 5 DAYS Performed at [facility]    Report Status 11/29/2015 FINAL  Final  Culture, blood (routine x 2)     Status: None   Collection Time: 11/24/15  2:00 AM  Result Value Ref Range Status   Specimen Description BLOOD LEFT ARM  Final   Special Requests   Final    BOTTLES DRAWN AEROBIC AND ANAEROBIC 9 CC BOTH BOTTLES   Culture   Final    NO GROWTH 5 DAYS Performed at [facility]    Report Status 11/29/2015 FINAL  Final  Surgical pcr screen     Status: Abnormal   Collection Time: 11/27/15 12:59 PM  Result Value Ref Range Status   MRSA, PCR INVALID RESULTS, SPECIMEN SENT FOR CULTURE (A) NEGATIVE Final   Staphylococcus aureus INVALID RESULTS, SPECIMEN SENT FOR CULTURE (A) NEGATIVE Final    Comment:  The Xpert SA Assay (FDA approved for NASAL specimens in patients over 74 years of age), is one component of a comprehensive surveillance program.  Test performance has been validated by [facility] for patients greater than or equal to 56 year old. It is not intended to diagnose infection nor to guide or monitor treatment.   MRSA culture     Status: None   Collection Time: 11/27/15  1:00 PM  Result Value Ref Range Status   Specimen Description NOSE  Final   Special Requests NONE  Final   Culture   Final    NO GROWTH 2 DAYS Performed at [facility]    Report Status 11/29/2015 FINAL  Final  Aerobic/Anaerobic Culture (surgical/deep wound)     Status: None   Collection Time: 11/27/15  2:11 PM  Result Value Ref Range Status   Specimen Description ABSCESS ABDOMINAL WALL  Final   Special Requests NONE  Final   Gram Stain   Final    FEW WBC PRESENT, PREDOMINANTLY PMN FEW GRAM POSITIVE COCCI IN CLUSTERS    Culture   Final    MODERATE METHICILLIN RESISTANT STAPHYLOCOCCUS AUREUS NO ANAEROBES ISOLATED Performed at [facility]    Report Status 12/02/2015 FINAL  Final   Organism ID, Bacteria METHICILLIN RESISTANT  STAPHYLOCOCCUS AUREUS  Final      Susceptibility   Methicillin resistant staphylococcus aureus - MIC*    CIPROFLOXACIN >=8 RESISTANT Resistant     ERYTHROMYCIN >=8 RESISTANT Resistant     GENTAMICIN <=0.5 SENSITIVE Sensitive     OXACILLIN >=4 RESISTANT Resistant     TETRACYCLINE <=1 SENSITIVE Sensitive     VANCOMYCIN <=0.5 SENSITIVE Sensitive     TRIMETH/SULFA <=10 SENSITIVE Sensitive     CLINDAMYCIN <=0.25 SENSITIVE Sensitive     RIFAMPIN <=0.5 SENSITIVE Sensitive     Inducible Clindamycin NEGATIVE Sensitive     * MODERATE METHICILLIN RESISTANT STAPHYLOCOCCUS AUREUS     Invalid input(s): PROCALCITONIN, LACTICACIDVEN   Radiology Studies: No results found.    Scheduled Meds: . buPROPion  150 mg Oral Daily  . celecoxib  200 mg Oral QHS  . enoxaparin (LOVENOX) injection  40 mg Subcutaneous Q24H  . estradiol  1 mg Oral QHS  . feeding supplement (ENSURE ENLIVE)  237 mL Oral Q24H  . feeding supplement (PRO-STAT SUGAR FREE 64)  30 mL Oral Daily  . lip balm  1 application Topical BID  . multivitamin with minerals  1 tablet Oral Daily  . progesterone  100 mg Oral QHS  . senna  1 tablet Oral BID  . sodium chloride flush  10-40 mL Intracatheter Q12H  . vancomycin  750 mg Intravenous Q12H  . vitamin C  500 mg Oral BID   Continuous Infusions: . sodium chloride 100 mL/hr at 12/02/15 2308     LOS: 9 days    Cordelia Poche, MD [facility] Pager [phone]  If 7PM-7AM, please contact night-coverage [email or web address] Password [facility] 12/03/2015, 4:06 PM

## 2015-12-03 NOTE — Progress Notes (Signed)
Just woke up and does not want me to change bandages.  She can follow up with Dr. Lucia Gaskins one week after discharge.  Please have her call [phone] to make an appointment.

## 2015-12-03 NOTE — Discharge Instructions (Signed)
Call [phone] to make an appointment to see Dr. Lucia Gaskins (surgeon).  He wants to see you one week after discharge.  Twice a day dressing changes as per his instructions.

## 2015-12-04 LAB — DIFFERENTIAL
BASOS PCT: 0 %
Basophils Absolute: 0 10*3/uL (ref 0.0–0.1)
EOS PCT: 2 %
Eosinophils Absolute: 0.2 10*3/uL (ref 0.0–0.7)
Lymphocytes Relative: 27 %
Lymphs Abs: 2.7 10*3/uL (ref 0.7–4.0)
MONO ABS: 1.1 10*3/uL — AB (ref 0.1–1.0)
Monocytes Relative: 11 %
NEUTROS ABS: 5.9 10*3/uL (ref 1.7–7.7)
Neutrophils Relative %: 60 %

## 2015-12-04 LAB — CBC
HEMATOCRIT: 27 % — AB (ref 36.0–46.0)
Hemoglobin: 9.2 g/dL — ABNORMAL LOW (ref 12.0–15.0)
MCH: 31.3 pg (ref 26.0–34.0)
MCHC: 34.1 g/dL (ref 30.0–36.0)
MCV: 91.8 fL (ref 78.0–100.0)
PLATELETS: 683 10*3/uL — AB (ref 150–400)
RBC: 2.94 MIL/uL — AB (ref 3.87–5.11)
RDW: 14.8 % (ref 11.5–15.5)
WBC: 10 10*3/uL (ref 4.0–10.5)

## 2015-12-04 NOTE — Progress Notes (Signed)
[name] for Infectious Disease    Date of Admission:  11/23/2015   Total days of antibiotics 12        Day 12 vanco   ID: Ebony Stanley is a 60 y.o. female with multiple MRSA absess Principal Problem:   Cellulitis of right axilla Active Problems:   Depression   Abscess of vulva s/p I&D   Cellulitis of abdominal wall   Obesity (BMI 30-39.9)   Hypokalemia   Leukocytosis   MRSA (methicillin resistant staph aureus) culture positive   Cellulitis   Sepsis ([facility])   Abscess    Subjective: tmax yesterday of 100.19F. Per surgery wound bed improving. Still having pain with woundcare dressing changes.    Medications:  . buPROPion  150 mg Oral Daily  . celecoxib  200 mg Oral QHS  . enoxaparin (LOVENOX) injection  40 mg Subcutaneous Q24H  . estradiol  1 mg Oral QHS  . feeding supplement (ENSURE ENLIVE)  237 mL Oral Q24H  . feeding supplement (PRO-STAT SUGAR FREE 64)  30 mL Oral Daily  . lip balm  1 application Topical BID  . multivitamin with minerals  1 tablet Oral Daily  . progesterone  100 mg Oral QHS  . senna  1 tablet Oral BID  . sodium chloride flush  10-40 mL Intracatheter Q12H  . vancomycin  750 mg Intravenous Q12H  . vitamin C  500 mg Oral BID    Objective: Vital signs in last 24 hours: Temp:  [98.8 F (37.1 C)-100.3 F (37.9 C)] 99 F (37.2 C) (08/14 0422) Pulse Rate:  [88-101] 89 (08/14 0422) Resp:  [16-18] 18 (08/14 0422) BP: (135-145)/(58-74) 138/58 (08/14 0422) SpO2:  [95 %-96 %] 96 % (08/14 0422) Physical Exam  Constitutional:  oriented to person, place, and time. appears well-developed and well-nourished. No distress.  HENT: [place]/AT, PERRLA, no scleral icterus Mouth/Throat: Oropharynx is clear and moist. No oropharyngeal exudate.  Cardiovascular: Normal rate, regular rhythm and normal heart sounds. Exam reveals no gallop and no friction rub.  No murmur heard.  Pulmonary/Chest: Effort normal and breath sounds normal. No respiratory distress.  has  no wheezes.  Axilla = right axilla bandaged from I x D Neck = supple, no nuchal rigidity Abdominal: Soft. Bowel sounds are normal.  exhibits no distension. Mild tenderness near lower abdomen. Bandaged at lower aspect of abd from IX D Lymphadenopathy: no cervical adenopathy. No axillary adenopathy Neurological: alert and oriented to person, place, and time.  Skin: Skin is warm and dry. No rash noted. No erythema.  Psychiatric: a normal mood and affect.  behavior is normal.    Lab Results  Recent Labs  12/02/15 0330 12/03/15 0500 12/04/15 0625  WBC 12.5* 11.7* 10.0  HGB 8.9* 8.6* 9.2*  HCT 25.9* 25.6* 27.0*  NA 138  --   --   K 3.7  --   --   CL 104  --   --   CO2 28  --   --   BUN 8  --   --   CREATININE 0.98  --   --     Microbiology: 8/7 OR wound cx- MRSA 8/4 blood cx NGTD Studies/Results: No results found.   Assessment/Plan:  - has better source control now that she has undergone I x D,  Continue on IV vancomycin  -  - recommend to give IV vanco  to finish 3 wk course of treatment - agree with primary team that cleaning wound in addition to tolerating  dressing changes will be key in healing  - leukocytosis = resolved.  - pain management -> consider adding lidocaine patch near surgical incision area as possible addition  Therapeutic drug monitoring = Currently therapeutic. Aim for goal of vanco trough 15-20.  - will arrange for follow up at [facility] in 2 wk    End date for vancomycin will be: august 21st.  Home health order listed below  Diagnosis: MRSA deep tissue infection  Culture Result: MRSA  Allergies  Allergen Reactions  . Kenalog [Triamcinolone] Anaphylaxis    Was mixed with lidocaine, unsure which caused the reaction   . Lidocaine Anaphylaxis    Was mixed with the Kenalog, unsure which medication caused the reaction Pt repots she received Lidocaine since that time without trouble.    Discharge antibiotics: Per pharmacy protocol  Aim for  Vancomycin trough 15-20 (unless otherwise indicated) Duration: 3 wk End Date: August 21st  Can have her picc line pulled after IV abtx have stopped [facility] Care Per Protocol:  Labs weekly while on IV antibiotics: _x_ CBC with differential _x_ BMP twice per week _x_ Vancomycin trough _x_  Sed rate _ x_  crp  Fax weekly labs to [phone]  Clinic Follow Up Appt: 2 wk with dr Genessa Beman   Baxter Flattery, [facility] Cell: [phone] Pager: [phone]  12/04/2015, 11:23 AM

## 2015-12-04 NOTE — Progress Notes (Signed)
PROGRESS NOTE  Ebony Stanley  [id number] DOB: July 29, 1955 DOA: 11/23/2015 PCP: No primary care provider on file.   Brief Narrative:  60 year old with past medical history of obesity, MRSA cellulitis came in with recurrent valvular abscess, right axillary abscess and suprapubic. Abscesses were drained by surgery and patient was continued on vancomycin and will continue for a total course of 3 weeks  Assessment & Plan:  Principal Problem:   Cellulitis of right axilla Active Problems:   Depression   Abscess of vulva s/p I&D   Cellulitis of abdominal wall   Obesity (BMI 30-39.9)   Hypokalemia   Leukocytosis   MRSA (methicillin resistant staph aureus) culture positive   Cellulitis   Sepsis ([place])   Abscess   Cellulitis, MRSA Abscess, MRSA Culture of wound grew MRSA sensitive to Vancomycin. Afebrile however low-grade temp -Continue IV Vancomycin per ID recommendations for a 3 week course (8/3>>8/21).  -Blood culture negative (Final) -Continue dressing changes twice a day  Anemia Hemoglobin stable. No obvious -FOBT pending  Abscess of the vulva stable -Has 3 different abscesses in her vulva, this was seated and drained by OB/GYN. -Grew MRSA.  Hypokalemia Resolved  Depression -Continue Wellbutrin  Sepsis Resolved. Present on admission, Temperature of 102.1, WBCs of 25.5 on admission and presence of infection. Blood cultures negative. Likely secondary to abscesses.  DVT prophylaxis: Subcutaneous heparin Code Status: Full Code Family Communication:  Disposition Plan: Likely SNF in 1-2 days versus home with home health if patient is able to get to help with Dressing Changes Diet: Diet regular Room service appropriate? Yes; Fluid consistency: Thin  Consultants:   Gen. surgery.  Infectious disease.  Procedures:   PICC Line (8/11>>  Antimicrobials:   Vancomycin (8/3>>8/21  Subjective: Patient reports no events overnight. Her pain is improving slightly.  Pain with dressing changes has improved as well slightly.  Objective: Vitals:   12/03/15 0635 12/03/15 1500 12/03/15 2127 12/04/15 0422  BP: (!) 120/58 135/74 (!) 145/66 (!) 138/58  Pulse: 87 88 (!) 101 89  Resp: 16 16  18   Temp: 98.8 F (37.1 C) 98.8 F (37.1 C) 100.3 F (37.9 C) 99 F (37.2 C)  TempSrc: Oral Oral Oral Oral  SpO2: 98% 95% 95% 96%  Weight:      Height:        Intake/Output Summary (Last 24 hours) at 12/04/15 0943 Last data filed at 12/04/15 0934  Gross per 24 hour  Intake             1400 ml  Output                0 ml  Net             1400 ml   Filed Weights   11/23/15 2245  Weight: 81.2 kg (179 lb)    Examination: General exam: Appears calm and comfortable.Initially sleeping, easily arousable  Respiratory system: Clear to auscultation. Respiratory effort normal. Cardiovascular system: S1 & S2 heard, RRR. No murmurs, rubs, gallops or clicks. No pedal edema. Gastrointestinal system: Abdomen is nondistended, soft and nontender. No organomegaly or masses felt. Normal bowel sounds heard. Central nervous system: Alert and oriented. No focal neurological deficits. Extremities: Normal bulk and tone.  Skin: Dressings under right axilla and over abdomen are dry and intact Psychiatry: Judgement and insight appear normal. Mood & affect appropriate.   Data Reviewed: I have personally reviewed following labs and imaging studies  CBC:  Recent Labs Lab 11/28/15 0501 11/30/15 0411 12/02/15  0330 12/03/15 0500 12/04/15 0625  WBC 15.0* 13.9* 12.5* 11.7* 10.0  NEUTROABS  --   --   --   --  5.9  HGB 9.8* 9.1* 8.9* 8.6* 9.2*  HCT 28.9* 27.6* 25.9* 25.6* 27.0*  MCV 92.9 92.0 91.8 91.1 91.8  PLT 462* 523* 584* 588* [id number]*   Basic Metabolic Panel:  Recent Labs Lab 11/28/15 0501 11/29/15 0430 11/30/15 0411 12/02/15 0330  NA 137 138 137 138  K 4.1 3.8 4.2 3.7  CL 105 106 105 104  CO2 27 26 25 28   GLUCOSE 94 98 99 106*  BUN 11 9 8 8   CREATININE 1.09* 1.05*  0.88 0.98  CALCIUM 7.3* 7.3* 7.6* 7.9*   GFR: Estimated Creatinine Clearance: 58.4 mL/min (by C-G formula based on SCr of 0.98 mg/dL). Liver Function Tests: No results for input(s): AST, ALT, ALKPHOS, BILITOT, PROT, ALBUMIN in the last 168 hours. No results for input(s): LIPASE, AMYLASE in the last 168 hours. No results for input(s): AMMONIA in the last 168 hours. Coagulation Profile: No results for input(s): INR, PROTIME in the last 168 hours. Cardiac Enzymes: No results for input(s): CKTOTAL, CKMB, CKMBINDEX, TROPONINI in the last 168 hours. BNP (last 3 results) No results for input(s): PROBNP in the last 8760 hours. HbA1C: No results for input(s): HGBA1C in the last 72 hours. CBG: No results for input(s): GLUCAP in the last 168 hours. Lipid Profile: No results for input(s): CHOL, HDL, LDLCALC, TRIG, CHOLHDL, LDLDIRECT in the last 72 hours. Thyroid Function Tests: No results for input(s): TSH, T4TOTAL, FREET4, T3FREE, THYROIDAB in the last 72 hours. Anemia Panel: No results for input(s): VITAMINB12, FOLATE, FERRITIN, TIBC, IRON, RETICCTPCT in the last 72 hours. Urine analysis:    Component Value Date/Time   COLORURINE YELLOW 09/16/2014 0714   APPEARANCEUR CLOUDY (A) 09/16/2014 0714   LABSPEC 1.024 09/16/2014 0714   PHURINE 6.0 09/16/2014 0714   GLUCOSEU NEGATIVE 09/16/2014 0714   HGBUR LARGE (A) 09/16/2014 0714   BILIRUBINUR NEGATIVE 09/16/2014 0714   BILIRUBINUR n 11/30/2012 1256   KETONESUR 40 (A) 09/16/2014 0714   PROTEINUR NEGATIVE 09/16/2014 0714   UROBILINOGEN 0.2 09/16/2014 0714   NITRITE NEGATIVE 09/16/2014 0714   LEUKOCYTESUR NEGATIVE 09/16/2014 0714    Recent Results (from the past 240 hour(s))  Surgical pcr screen     Status: Abnormal   Collection Time: 11/27/15 12:59 PM  Result Value Ref Range Status   MRSA, PCR INVALID RESULTS, SPECIMEN SENT FOR CULTURE (A) NEGATIVE Final   Staphylococcus aureus INVALID RESULTS, SPECIMEN SENT FOR CULTURE (A) NEGATIVE  Final    Comment:        The Xpert SA Assay (FDA approved for NASAL specimens in patients over 29 years of age), is one component of a comprehensive surveillance program.  Test performance has been validated by [facility] for patients greater than or equal to 58 year old. It is not intended to diagnose infection nor to guide or monitor treatment.   MRSA culture     Status: None   Collection Time: 11/27/15  1:00 PM  Result Value Ref Range Status   Specimen Description NOSE  Final   Special Requests NONE  Final   Culture   Final    NO GROWTH 2 DAYS Performed at [facility]    Report Status 11/29/2015 FINAL  Final  Aerobic/Anaerobic Culture (surgical/deep wound)     Status: None   Collection Time: 11/27/15  2:11 PM  Result Value Ref Range Status  Specimen Description ABSCESS ABDOMINAL WALL  Final   Special Requests NONE  Final   Gram Stain   Final    FEW WBC PRESENT, PREDOMINANTLY PMN FEW GRAM POSITIVE COCCI IN CLUSTERS    Culture   Final    MODERATE METHICILLIN RESISTANT STAPHYLOCOCCUS AUREUS NO ANAEROBES ISOLATED Performed at [facility]    Report Status 12/02/2015 FINAL  Final   Organism ID, Bacteria METHICILLIN RESISTANT STAPHYLOCOCCUS AUREUS  Final      Susceptibility   Methicillin resistant staphylococcus aureus - MIC*    CIPROFLOXACIN >=8 RESISTANT Resistant     ERYTHROMYCIN >=8 RESISTANT Resistant     GENTAMICIN <=0.5 SENSITIVE Sensitive     OXACILLIN >=4 RESISTANT Resistant     TETRACYCLINE <=1 SENSITIVE Sensitive     VANCOMYCIN <=0.5 SENSITIVE Sensitive     TRIMETH/SULFA <=10 SENSITIVE Sensitive     CLINDAMYCIN <=0.25 SENSITIVE Sensitive     RIFAMPIN <=0.5 SENSITIVE Sensitive     Inducible Clindamycin NEGATIVE Sensitive     * MODERATE METHICILLIN RESISTANT STAPHYLOCOCCUS AUREUS     Invalid input(s): PROCALCITONIN, LACTICACIDVEN   Radiology Studies: No results found.    Scheduled Meds: . buPROPion  150 mg Oral Daily  . celecoxib   200 mg Oral QHS  . enoxaparin (LOVENOX) injection  40 mg Subcutaneous Q24H  . estradiol  1 mg Oral QHS  . feeding supplement (ENSURE ENLIVE)  237 mL Oral Q24H  . feeding supplement (PRO-STAT SUGAR FREE 64)  30 mL Oral Daily  . lip balm  1 application Topical BID  . multivitamin with minerals  1 tablet Oral Daily  . progesterone  100 mg Oral QHS  . senna  1 tablet Oral BID  . sodium chloride flush  10-40 mL Intracatheter Q12H  . vancomycin  750 mg Intravenous Q12H  . vitamin C  500 mg Oral BID   Continuous Infusions: . sodium chloride 100 mL/hr at 12/03/15 2122     LOS: 10 days    Cordelia Poche, MD [facility] Pager [phone]  If 7PM-7AM, please contact night-coverage [email or web address] Password [facility] 12/04/2015, 9:43 AM

## 2015-12-04 NOTE — Clinical Social Work Placement (Signed)
   CLINICAL SOCIAL WORK PLACEMENT  NOTE  Date:  12/04/2015  Patient Details  Name: Ebony Stanley MRN: [id number] Date of Birth: Aug 24, 1955  Clinical Social Work is seeking post-discharge placement for this patient at the [place] level of care (*CSW will initial, date and re-position this form in  chart as items are completed):  Yes   Patient/family provided with [facility] Work Department's list of facilities offering this level of care within the geographic area requested by the patient (or if unable, by the patient's family).  Yes   Patient/family informed of their freedom to choose among providers that offer the needed level of care, that participate in Medicare, Medicaid or managed care program needed by the patient, have an available bed and are willing to accept the patient.  Yes   Patient/family informed of [facility]'s ownership interest in [facility] and [facility], as well as of the fact that they are under no obligation to receive care at these facilities.  PASRR submitted to EDS on 11/27/15     PASRR number received on 11/27/15     Existing PASRR number confirmed on       FL2 transmitted to all facilities in geographic area requested by pt/family on 12/04/15     FL2 transmitted to all facilities within larger geographic area on       Patient informed that his/her managed care company has contracts with or will negotiate with certain facilities, including the following:        Yes   Patient/family informed of bed offers received.  Patient chooses bed at [facility]     Physician recommends and patient chooses bed at      Patient to be transferred to   on  .  Patient to be transferred to facility by       Patient family notified on   of transfer.  Name of family member notified:        PHYSICIAN       Additional Comment:    _______________________________________________ Luretha Rued, [place] 12/04/2015, 1:51 PM

## 2015-12-04 NOTE — Care Management Note (Signed)
Case Management Note  Patient Details  Name: Paisli Ellefsen MRN: [id number] Date of Birth: May 13, 1955  Subjective/Objective: [facility] already following for [facility]. Patient has a friend who will asst w/home iv abx, & dsg changes. [facility] rep Santiago Glad, & ahc iv theapy Pam aware.                   Action/Plan:d/c home w/HHC.   Expected Discharge Date:   (unknown)               Expected Discharge Plan:  [place]  In-House Referral:  NA  Discharge planning Services  CM Consult  Post Acute Care Choice:  Home Health Choice offered to:  Patient  DME Arranged:  N/A DME Agency:  NA  HH Arranged:  RN, IV Antibiotics HH Agency:  [place]  Status of Service:  In process, will continue to follow  If discussed at Long Length of Stay Meetings, dates discussed:    Additional Comments:  Dessa Phi, RN 12/04/2015, 4:38 PM

## 2015-12-04 NOTE — Progress Notes (Signed)
Friend at bedside, instructed how to change abdominal and right axilla dressings, return demonstration by friend, dressing changed appropriately

## 2015-12-04 NOTE — Progress Notes (Signed)
Nutrition Follow-up  DOCUMENTATION CODES:   Obesity unspecified  INTERVENTION:  -Continue Ensure Enlive po BID q24h, each supplement provides 350 kcal and 20 grams of protein -Continue Pro-Stat 85mL q24h, each supplement provides 100 calories and 15 grams of protein -Continue MV with Minerals  NUTRITION DIAGNOSIS:   Predicted suboptimal nutrient intake related to poor appetite as evidenced by per patient/family report, meal completion < 25%. -resolving  GOAL:   Patient will meet greater than or equal to 90% of their needs -progressing  MONITOR:   PO intake, Skin, I & O's, Labs  REASON FOR ASSESSMENT:   Malnutrition Screening Tool    ASSESSMENT:   60 year old with past medical history of obesity, MRSA cellulitis came in with recurrent valvular abscess, right axillary abscess and suprapubic, ID to evaluate.  Ms. Werkmeister is feeling better today. She did not have anything for breakfast, did not have much of an appetite, but is experiencing less pain, just some mild discomfort. Avg 59% PO over past 8 meals with 75-100% over 2 meals yesterday. No new wts. Labs and Medications reviewed: Vitamin C, Senokot NS @ 149mL/hr  Diet Order:  Diet regular Room service appropriate? Yes; Fluid consistency: Thin  Skin:  Wound (see comment) (cellulitis of abdomen, incision on peritoneum)  Last BM:  7/30  Height:   Ht Readings from Last 1 Encounters:  11/23/15 5' (1.524 m)    Weight:   Wt Readings from Last 1 Encounters:  11/23/15 179 lb (81.2 kg)    Ideal Body Weight:  45.5 kg  BMI:  Body mass index is 34.96 kg/m.  Estimated Nutritional Needs:   Kcal:  1600-1800  Protein:  90-100 grams  Fluid:  1.8 L/day  EDUCATION NEEDS:   No education needs identified at this time  Satira Anis. Raziel Koenigs, MS, RD LDN Inpatient Clinical Dietitian Pager [phone]

## 2015-12-04 NOTE — NC FL2 (Signed)
[place] LEVEL OF CARE SCREENING TOOL     IDENTIFICATION  Patient Name: Ebony Stanley Birthdate: [birth year] Sex: female Admission Date (Current Location): 11/23/2015  [facility] and [place] Number:  Herbalist and Address:  [facility],  [place] [address] [place]      Provider Number: [id number]  Attending Physician Name and Address:  Mariel Aloe, MD  Relative Name and Phone Number:       Current Level of Care: SNF Recommended Level of Care: [place] Prior Approval Number:    Date Approved/Denied:   PASRR Number: [id number]  Discharge Plan: SNF    Current Diagnoses: Patient Active Problem List   Diagnosis Date Noted  . Abscess   . Sepsis ([place]) 11/25/2015  . Cellulitis 11/24/2015  . Abscess of vulva s/p I&D 11/23/2015  . Cellulitis of right axilla 11/23/2015  . Cellulitis of abdominal wall 11/23/2015  . Obesity (BMI 30-39.9) 11/23/2015  . Hypokalemia 11/23/2015  . Leukocytosis 11/23/2015  . MRSA (methicillin resistant staph aureus) culture positive 11/23/2015  . Family history of ovarian cancer 11/30/2012  . Personal history of colonic adenomas 06/12/2012  . Unspecified constipation 06/12/2012  . Insomnia 05/23/2011  . Depression 05/23/2011    Orientation RESPIRATION BLADDER Height & Weight     Self, Time, Situation, Place  Normal Continent Weight: 179 lb (81.2 kg) Height:  5' (152.4 cm)  BEHAVIORAL SYMPTOMS/MOOD NEUROLOGICAL BOWEL NUTRITION STATUS  Other (Comment) (no behaviors)   Continent Diet  AMBULATORY STATUS COMMUNICATION OF NEEDS Skin   Supervision Verbally Other (Comment), Surgical wounds (Dressing changes 2 x daily)                       Personal Care Assistance Level of Assistance  Bathing, Feeding, Dressing Bathing Assistance: Independent Feeding assistance: Independent Dressing Assistance: Independent     Functional Limitations Info  Sight, Hearing, Speech Sight Info:  Adequate Hearing Info: Adequate Speech Info: Adequate    SPECIAL CARE FACTORS FREQUENCY                       Contractures Contractures Info: Not present    Additional Factors Info  Code Status, Isolation Precautions (Full Code)         Isolation Precautions Info: MRSA     Current Medications (12/04/2015):  This is the current hospital active medication list Current Facility-Administered Medications  Medication Dose Route Frequency Provider Last Rate Last Dose  . 0.9 %  sodium chloride infusion   Intravenous Continuous Mariel Aloe, MD 100 mL/hr at 12/03/15 2122    . acetaminophen (TYLENOL) tablet 650 mg  650 mg Oral Q6H PRN Ritta Slot, NP   650 mg at 12/01/15 2320  . alum & mag hydroxide-simeth (MAALOX/MYLANTA) 200-200-20 MG/5ML suspension 30 mL  30 mL Oral Q6H PRN Michael Boston, MD   30 mL at 11/27/15 0120  . buPROPion (WELLBUTRIN XL) 24 hr tablet 150 mg  150 mg Oral Daily Karmen Bongo, MD   150 mg at 12/03/15 1018  . celecoxib (CELEBREX) capsule 200 mg  200 mg Oral QHS Karmen Bongo, MD   200 mg at 12/03/15 2122  . diphenhydrAMINE (BENADRYL) 12.5 MG/5ML elixir 12.5-25 mg  12.5-25 mg Oral Q6H PRN Verlee Monte, MD   25 mg at 12/03/15 0222  . enoxaparin (LOVENOX) injection 40 mg  40 mg Subcutaneous Q24H Karmen Bongo, MD   40 mg at 12/03/15 1018  .  estradiol (ESTRACE) tablet 1 mg  1 mg Oral QHS Karmen Bongo, MD   1 mg at 12/03/15 2122  . feeding supplement (ENSURE ENLIVE) (ENSURE ENLIVE) liquid 237 mL  237 mL Oral Q24H Verlee Monte, MD   237 mL at 12/01/15 2010  . feeding supplement (PRO-STAT SUGAR FREE 64) liquid 30 mL  30 mL Oral Daily Verlee Monte, MD   30 mL at 11/30/15 1700  . HYDROcodone-acetaminophen (NORCO/VICODIN) 5-325 MG per tablet 1-2 tablet  1-2 tablet Oral Q4H PRN Alphonsa Overall, MD   2 tablet at 12/03/15 2038  . HYDROmorphone (DILAUDID) injection 0.5-2 mg  0.5-2 mg Intravenous Q2H PRN Michael Boston, MD   1 mg at 12/03/15 2213  . lip balm (CARMEX) ointment  1 application  1 application Topical BID Michael Boston, MD   1 application at [id number]  . LORazepam (ATIVAN) tablet 0.5 mg  0.5 mg Oral BID PRN Earnstine Regal, PA-C   0.5 mg at 12/01/15 2201  . magic mouthwash  15 mL Oral QID PRN Michael Boston, MD      . menthol-cetylpyridinium (CEPACOL) lozenge 3 mg  1 lozenge Oral PRN Michael Boston, MD      . metoprolol (LOPRESSOR) injection 5 mg  5 mg Intravenous Q6H PRN Michael Boston, MD      . metoprolol tartrate (LOPRESSOR) tablet 12.5 mg  12.5 mg Oral Q12H PRN Michael Boston, MD      . multivitamin with minerals tablet 1 tablet  1 tablet Oral Daily Verlee Monte, MD   1 tablet at 12/03/15 1018  . ondansetron (ZOFRAN) injection 4 mg  4 mg Intravenous Q6H PRN Michael Boston, MD   4 mg at 12/03/15 2359   Or  . ondansetron (ZOFRAN) 8 mg in sodium chloride 0.9 % 50 mL IVPB  8 mg Intravenous Q6H PRN Michael Boston, MD      . ondansetron (ZOFRAN-ODT) disintegrating tablet 8 mg  8 mg Oral Q8H PRN Carlyle Basques, MD   8 mg at 11/24/15 1619  . phenol (CHLORASEPTIC) mouth spray 2 spray  2 spray Mouth/Throat PRN Michael Boston, MD      . progesterone (PROMETRIUM) capsule 100 mg  100 mg Oral QHS Karmen Bongo, MD   100 mg at 12/03/15 2122  . senna (SENOKOT) tablet 8.6 mg  1 tablet Oral BID Karmen Bongo, MD   8.6 mg at 12/03/15 2122  . sodium chloride flush (NS) 0.9 % injection 10-40 mL  10-40 mL Intracatheter Q12H Mariel Aloe, MD   10 mL at 12/02/15 0334  . sodium chloride flush (NS) 0.9 % injection 10-40 mL  10-40 mL Intracatheter PRN Mariel Aloe, MD   10 mL at 12/04/15 0618  . vancomycin (VANCOCIN) IVPB 750 mg/150 ml premix  750 mg Intravenous Q12H Drew A Wofford, RPH   750 mg at 12/04/15 0416  . vitamin C (ASCORBIC ACID) tablet 500 mg  500 mg Oral BID Michael Boston, MD   500 mg at 12/03/15 2122     Discharge Medications: Please see discharge summary for a list of discharge medications.  Relevant Imaging Results:  Relevant Lab Results:   Additional  Information SS # [id number]  Jaisa Defino, Randall An, LCSW

## 2015-12-04 NOTE — Clinical Social Work Note (Signed)
Clinical Social Work Assessment  Patient Details  Name: Ebony Stanley MRN: [id number] Date of Birth: [birth year]  Date of referral:  12/04/15               Reason for consult:  Facility Placement, Discharge Planning                Permission sought to share information with:  Facility Art therapist granted to share information::  Yes, Verbal Permission Granted  Name::        Agency::     Relationship::     Contact Information:     Housing/Transportation Living arrangements for the past 2 months:  Apartment Source of Information:  Patient Patient Interpreter Needed:  None Criminal Activity/Legal Involvement Pertinent to Current Situation/Hospitalization:  No - Comment as needed Significant Relationships:  Adult Children Lives with:  Adult Children Do you feel safe going back to the place where you live?  Yes Need for family participation in patient care:  No (Coment)  Care giving concerns:  Pt does not have adequate support at home to manage care at d/c.   Social Worker assessment / plan:  Pt hospitalized form home on 11/23/15 with cellulitis of the right axilla. CSW met with pt to assist with d/c planning. Pt was planning to return home at dc but does not have anyone to assist with dressing changes. Pt has agreed to SNF placement and SNF search has been initiated and bed offers provided. Pt has chosen [facility]. SNF has requested authorization from  [facility]. A decision is pending. CSW will continue to follow to assist with d/c planning.  Employment status:  [employer] information:  Managed Care PT Recommendations:  Not assessed at this time Information / Referral to community resources:  [place]  Patient/Family's Response to care:  Pt will accept SNF placement if needed but prefers to return home.  Patient/Family's Understanding of and Emotional Response to Diagnosis, Current Treatment, and Prognosis:  Pt is aware of her medical status.  Pt understands that she needs assistance at home to manage wound care. Pt is tearful, " I want to go home but my daughter isn't able to assist with my dressing change. " Pt is trying to find a friend that may be able to assist. Support / reassurance provided.  Emotional Assessment Appearance:  Appears stated age Attitude/Demeanor/Rapport:  Other (cooperative) Affect (typically observed):  Sad, Tearful/Crying Orientation:  Oriented to Self, Oriented to Place, Oriented to  Time, Oriented to Situation Alcohol / Substance use:  Not Applicable Psych involvement (Current and /or in the community):  No (Comment)  Discharge Needs  Concerns to be addressed:  Discharge Planning Concerns Readmission within the last 30 days:  No Current discharge risk:  None Barriers to Discharge:  No Barriers Identified   Luretha Rued, [place] 12/04/2015, 1:22 PM

## 2015-12-05 DIAGNOSIS — L03311 Cellulitis of abdominal wall: Secondary | ICD-10-CM

## 2015-12-05 DIAGNOSIS — F329 Major depressive disorder, single episode, unspecified: Secondary | ICD-10-CM

## 2015-12-05 DIAGNOSIS — D72829 Elevated white blood cell count, unspecified: Secondary | ICD-10-CM

## 2015-12-05 MED ORDER — ENSURE ENLIVE PO LIQD: 237.0000 mL | ORAL | 12 refills | Status: DC

## 2015-12-05 MED ORDER — VANCOMYCIN HCL IN DEXTROSE 750-5 MG/150ML-% IV SOLN: INTRAVENOUS | Status: DC

## 2015-12-05 MED ORDER — VANCOMYCIN HCL IN DEXTROSE 750-5 MG/150ML-% IV SOLN: INTRAVENOUS | 0 refills | Status: AC

## 2015-12-05 MED ORDER — PRO-STAT SUGAR FREE PO LIQD: 30.0000 mL | Freq: Every day | ORAL | 0 refills | Status: DC

## 2015-12-05 MED ORDER — HYDROCODONE-ACETAMINOPHEN 5-325 MG PO TABS: 1.0000 | ORAL_TABLET | ORAL | 0 refills | Status: DC | PRN

## 2015-12-05 MED ORDER — HEPARIN SOD (PORK) LOCK FLUSH 100 UNIT/ML IV SOLN
250.0000 [IU] | INTRAVENOUS | Status: AC | PRN
  Administered 2015-12-05: 250 [IU]

## 2015-12-05 NOTE — Progress Notes (Signed)
CSW assisting with d/c planning. Pt has decided to return home at d/c. RNCM is assisting with d/c planning. [facility] has contacted CSW reporting that [employer] has denied authorization for SNF placement indicating that her needs can be met at home.   CSW signing off.  Werner Lean LCSW [phone]

## 2015-12-05 NOTE — Progress Notes (Signed)
Pharmacy Antibiotic Note  Ebony Stanley is a 60 y.o. female admitted on 11/23/2015 with multiple recurrent MRSA abscesses with cellulitis.  Pharmacy has been consulted for vancomycin dosing.  Abscesses were drained by surgery and patient will continue vancomycin for a total course of 3 weeks.  Plan: Continue vancomycin 750 mg IV q12h. Monitoring per ID recommendations at discharge.  Height: 5' (152.4 cm) Weight: 179 lb (81.2 kg) IBW/kg (Calculated) : 45.5  Temp (24hrs), Avg:98.7 F (37.1 C), Min:98.5 F (36.9 C), Max:98.8 F (37.1 C)   Recent Labs Lab 11/29/15 0430 11/29/15 1510 11/30/15 0411 12/02/15 0330 12/03/15 0500 12/04/15 0625  WBC  --   --  13.9* 12.5* 11.7* 10.0  CREATININE 1.05*  --  0.88 0.98  --   --   VANCOTROUGH  --  25*  --  18  --   --     Estimated Creatinine Clearance: 58.4 mL/min (by C-G formula based on SCr of 0.98 mg/dL).    Allergies  Allergen Reactions  . Kenalog [Triamcinolone] Anaphylaxis    Was mixed with lidocaine, unsure which caused the reaction   . Lidocaine Anaphylaxis    Was mixed with the Kenalog, unsure which medication caused the reaction Pt repots she received Lidocaine since that time without trouble.    Antimicrobials this admission: 8/3 Vancomycin  >>  (8/21) 8/3 Zosyn >> 8/4 8/4 Clinda IV >> 8/5 (3 doses/ID)  Levels/dose changes this admission (VT goal 15-20): 8/6 @ 3pm VT= 18on Vanc 1gm IV q12h (prior to 7th dose) 8/9 @ 3PM VT = 25 on 1g IV q12 >> 750 mg q12 8/12 @ 0330 VT = 18 on 750mg  IV q12h >>no change  Microbiology results: 7/31 vulva lesions: MRSA (S- clinda, gent, bactrim, tetracycline, R - cipro, erythromycin, oxacillin) 7/31 HIV: NR 8/4 BCx x2: neg FINAL 8/7 Abd wall abscess cx: MRSA (S-clinda, gent, rifampin, tetracycline, bactrim, vanc; R-cipro, erythromycin, oxacillin) 8/7 MRSA PCR cx: NGF  Thank you for allowing pharmacy to be a part of this patient's care.  Hershal Coria 12/05/2015 11:22 AM

## 2015-12-05 NOTE — Progress Notes (Signed)
[facility]  Patient Status: New pt for [facility] this admission  [facility] is providing the following services: HHRN and Home Infusion Pharmacy for home IV ABX. [facility]fusion Coordinator provided hands on teaching with full mock run through of IV ABX last p.m.   Pt did very well. She has a husband at home that will also help per pt and a good friend who will also support IV ABX and wound care at home. [facility] will follow until DC to support transition home.   If patient discharges after hours, please call [phone].   Larry Sierras 12/05/2015, 8:57 AM

## 2015-12-05 NOTE — Progress Notes (Signed)
[facility]  Pt shared that her friend who will be performing her wound care at home will only be able to come one time per day.  Pt unable to perform wound care herself due to location of wound in axillary area. Daughter does not feel comfortable performing wound care and her husband is in [place].   Call to Delsa Grana, RN with [facility] and advised of the above situation.  Obtained order from Dr. Zella Richer to change wound care to daily; have pt shower well prior to each wound care procedure and to ensure she sees Dr. Lucia Gaskins in one week from [place].  Relayed to [place], RN on the unit and to patient.  [facility] will see pt tomorrow a.m. At home between 8-10 AM for first home Vancomycin dose.   Larry Sierras 12/05/2015, 4:59 PM

## 2015-12-10 NOTE — Discharge Summary (Signed)
Physician Discharge Summary  Ebony Stanley [id number] DOB: 1955/08/19 DOA: 11/23/2015  PCP: No primary care provider on file.  Admit date: 11/23/2015 Discharge date: 12/10/2015  Admitted From: Home Disposition:  Home  Recommendations for Outpatient Follow-up:  1. Follow up with PCP/General Surgery in 1-2 weeks 2. Please obtain CBC in one week  Home Health: Yes Equipment/Devices: PICC line Discharge Condition: Stable CODE STATUS: Full Code Diet recommendation: Heart Healthy   Brief/Interim Summary: 60 year old with past medical history of obesity, MRSA cellulitis came in with recurrent valvular abscess, right axillary abscess and suprapubic. Abscesses were drained by surgery and patient was continued on vancomycin and will continue for a total course of 3 weeks for MRSA infection. Blood cultures negative. Patient to go home with a PICC. Follow-up with general surgery outpatient.  Discharge Diagnoses:  Principal Problem:   Cellulitis of right axilla Active Problems:   Depression   Abscess of vulva s/p I&D   Cellulitis of abdominal wall   Obesity (BMI 30-39.9)   Hypokalemia   Leukocytosis   MRSA (methicillin resistant staph aureus) culture positive   Cellulitis   Sepsis ([place])   Abscess    Discharge Instructions  Discharge Instructions    Diet - low sodium heart healthy    Complete by:  As directed   Increase activity slowly    Complete by:  As directed       Medication List    STOP taking these medications   ibuprofen 600 MG tablet Commonly known as:  ADVIL,MOTRIN   oxyCODONE-acetaminophen 5-325 MG tablet Commonly known as:  PERCOCET/ROXICET   potassium chloride SA 15 MEQ tablet Commonly known as:  KLOR-CON M15   sulfamethoxazole-trimethoprim 800-160 MG tablet Commonly known as:  BACTRIM DS,SEPTRA DS     TAKE these medications   buPROPion 150 MG 24 hr tablet Commonly known as:  WELLBUTRIN XL TAKE 1 TABLET (150 MG TOTAL) BY MOUTH DAILY.   celecoxib 200  MG capsule Commonly known as:  CELEBREX Take 200 mg by mouth every evening.   estradiol 1 MG tablet Commonly known as:  ESTRACE TAKE 1 TABLET (1 MG TOTAL) BY MOUTH DAILY.   feeding supplement (ENSURE ENLIVE) Liqd Take 237 mLs by mouth daily.   feeding supplement (PRO-STAT SUGAR FREE 64) Liqd Take 30 mLs by mouth daily.   HYDROcodone-acetaminophen 5-325 MG tablet Commonly known as:  NORCO/VICODIN Take 1-2 tablets by mouth every 4 (four) hours as needed for moderate pain.   progesterone 100 MG capsule Commonly known as:  PROMETRIUM TAKE 1 CAPSULE (100 MG TOTAL) BY MOUTH DAILY.   senna 8.6 MG tablet Commonly known as:  SENOKOT Take 1 tablet by mouth 2 (two) times daily.   Vancomycin 750-5 MG/150ML-% Soln Commonly known as:  VANCOCIN Per [facility] protocol. Treat until 12/11/2015      Follow-up Information    [facility] .   Why:  home health nurse for wound care Contact information: [address] [place] [address] [phone]        Shann Medal, MD Follow up on 12/15/2015.   Specialty:  General Surgery Why:  Your appointment is at 11 AM, be at the office 30 minutes early for check in.   Contact information: [address] [place] [address] [phone]          Allergies  Allergen Reactions  . Kenalog [Triamcinolone] Anaphylaxis    Was mixed with lidocaine, unsure which caused the reaction   . Lidocaine  Anaphylaxis    Was mixed with the Kenalog, unsure which medication caused the reaction Pt repots she received Lidocaine since that time without trouble.    Consultations:  General surgery  Infectious disease   Procedures/Studies: [place] Chest  Result Date: 11/23/2015 CLINICAL DATA:  Recent abscess drainage from the vulva on 7 05/22/2015. Recent right axillary lump felt for approximately 1 week ago. This has increased in size. Area is red and hot. EXAM: CHEST ULTRASOUND COMPARISON:  None. FINDINGS: In the inferior  aspect of the right axilla, there is a 12 x 6 x 16 mm complex appearing mass or collection. The surrounding fat appears echogenic consistent with inflammation. More superiorly there is an irregular fluid collection neck contacts the dermis. It measures 2.8 x 1.7 x 2.6 cm. IMPRESSION: 1. There is inflammation in the right axilla. In the superior axilla there is a irregular complex collection measuring 2.8 x 1.7 x 2.6 cm and may connect to the scan. This could reflect an infected inclusion cyst. 2. More inferiorly in the right axilla is in more discrete complex collection versus a possible mass or inflamed lymph node. It measures 12 x 6 x 16 mm. 3. There is increased echogenicity throughout the axillary fat consistent with diffuse inflammation. Electronically Signed   By: Lajean Manes M.D.   On: 11/23/2015 21:30   US Pelvis Limited  Result Date: 11/23/2015 CLINICAL DATA:  Redness and induration of the right lower quadrant anterior abdominal wall. EXAM: US PELVIS LIMITED TECHNIQUE: Ultrasound examination of the pelvic soft tissues was performed in the area of clinical concern. COMPARISON:  None. FINDINGS: Limited sonographic evaluation of the right lower quadrant of the lower abdominal wall demonstrates a subcutaneous irregular hypoechoic mass versus complex fluid collection measuring 3.3 x 2.6 by 3.5. Communication with the skin surface is noted on several of the static images. Mobile debris also noted within this collection. IMPRESSION: Right paramedial lower anterior abdominal wall subcutaneous phlegmon collection with apparent communication to the skin, 3.5 cm in greatest dimension. Electronically Signed   By: Fidela Salisbury M.D.   On: 11/23/2015 21:28      Subjective: Patient's pain improving. Patient had a friend come over to help with dressing change. No concerns overnight.  Discharge Exam: Vitals:   12/05/15 0636 12/05/15 1400  BP: 126/65 136/73  Pulse: 80 85  Resp:  18  Temp:  98.7 F  (37.1 C)   Vitals:   12/04/15 2127 12/05/15 0455 12/05/15 0636 12/05/15 1400  BP: 135/70 (!) 152/70 126/65 136/73  Pulse: 92 81 80 85  Resp: 18 20  18   Temp: 98.5 F (36.9 C) 98.7 F (37.1 C)  98.7 F (37.1 C)  TempSrc: Oral Oral  Oral  SpO2: 97% 92%  97%  Weight:      Height:        General exam: Appears calm and comfortable. Respiratory system: Clear to auscultation. Respiratory effort normal. Cardiovascular system: S1 & S2 heard, RRR. No murmurs, rubs, gallops or clicks. No pedal edema. Gastrointestinal system: Abdomen is nondistended, soft and nontender. No organomegaly or masses felt. Normal bowel sounds heard. Central nervous system: Alert and oriented. No focal neurological deficits. Extremities: Normal bulk and tone.  Skin: Dressings under right axilla and over abdomen are dry and intact Psychiatry: Judgement and insight appear normal. Mood & affect appropriate    The results of significant diagnostics from this hospitalization (including imaging, microbiology, ancillary and laboratory) are listed below for reference.     Microbiology: No  results found for this or any previous visit (from the past 240 hour(s)).   Labs: CBC:  Recent Labs Lab 12/04/15 0625  WBC 10.0  NEUTROABS 5.9  HGB 9.2*  HCT 27.0*  MCV 91.8  PLT 683*   Urinalysis    Component Value Date/Time   COLORURINE YELLOW 09/16/2014 0714   APPEARANCEUR CLOUDY (A) 09/16/2014 0714   LABSPEC 1.024 09/16/2014 0714   PHURINE 6.0 09/16/2014 0714   GLUCOSEU NEGATIVE 09/16/2014 0714   HGBUR LARGE (A) 09/16/2014 0714   BILIRUBINUR NEGATIVE 09/16/2014 0714   BILIRUBINUR n 11/30/2012 1256   KETONESUR 40 (A) 09/16/2014 0714   PROTEINUR NEGATIVE 09/16/2014 0714   UROBILINOGEN 0.2 09/16/2014 0714   NITRITE NEGATIVE 09/16/2014 0714   LEUKOCYTESUR NEGATIVE 09/16/2014 0714    Time coordinating discharge: Over 30 minutes  SIGNED:   Cordelia Poche, MD  [facility] 12/10/2015, 4:36 PM Pager  [phone]  If 7PM-7AM, please contact night-coverage [email or web address] Password [email or web address]

## 2015-12-13 ENCOUNTER — Ambulatory Visit (INDEPENDENT_AMBULATORY_CARE_PROVIDER_SITE_OTHER): Payer: BC Managed Care – PPO | Admitting: Internal Medicine

## 2015-12-13 ENCOUNTER — Ambulatory Visit: Payer: BC Managed Care – PPO

## 2015-12-13 DIAGNOSIS — L02419 Cutaneous abscess of limb, unspecified: Secondary | ICD-10-CM | POA: Diagnosis not present

## 2015-12-13 DIAGNOSIS — L0291 Cutaneous abscess, unspecified: Secondary | ICD-10-CM

## 2015-12-13 DIAGNOSIS — A4902 Methicillin resistant Staphylococcus aureus infection, unspecified site: Secondary | ICD-10-CM

## 2015-12-13 DIAGNOSIS — K651 Peritoneal abscess: Secondary | ICD-10-CM | POA: Diagnosis not present

## 2015-12-13 DIAGNOSIS — R110 Nausea: Secondary | ICD-10-CM | POA: Diagnosis not present

## 2015-12-13 DIAGNOSIS — IMO0002 Reserved for concepts with insufficient information to code with codable children: Secondary | ICD-10-CM

## 2015-12-13 DIAGNOSIS — L089 Local infection of the skin and subcutaneous tissue, unspecified: Secondary | ICD-10-CM

## 2015-12-13 MED ORDER — KETOROLAC TROMETHAMINE 30 MG/ML IJ SOLN
30.0000 mg | Freq: Once | INTRAMUSCULAR | Status: AC
  Administered 2015-12-13: 30 mg via INTRAVENOUS

## 2015-12-13 MED ORDER — ONDANSETRON 8 MG PO TBDP: 8.0000 mg | ORAL_TABLET | Freq: Three times a day (TID) | ORAL | 0 refills | Status: DC | PRN

## 2015-12-13 MED ORDER — SULFAMETHOXAZOLE-TRIMETHOPRIM 800-160 MG PO TABS: 1.0000 | ORAL_TABLET | Freq: Two times a day (BID) | ORAL | 0 refills | Status: AC

## 2015-12-13 NOTE — Progress Notes (Signed)
RN received verbal order to discontinue the patient's PICC line.  Patient identified with name and date of birth. PICC dressing removed, site unremarkable.  PICC line removed using sterile procedure @ 1200. PICC length equal to that noted in patient's hospital chart of 41 cm. Sterile petroleum gauze + sterile 4X4 applied to PICC site, pressure applied for 10 minutes and covered with Medipore tape as a pressure dressing. Patient tolerated procedure without complaints.  Patient instructed to limit use of arm for 1 hour. Patient instructed that the pressure dressing should remain in place for 24 hours. Patient verbalized understanding of these instructions.

## 2015-12-13 NOTE — Assessment & Plan Note (Signed)
Continue with daily wet to dry dressing changes. She has upcoming appt with general surgery for further management. I have measured her wounds today and listed in physical exam for further monitoring

## 2015-12-13 NOTE — Progress Notes (Signed)
RFV: hospital follow up for MRSA deep tissue infection Subjective:    Patient ID: Ebony Stanley, female    DOB: Nov 28, 1955, 60 y.o.   MRN: [id number]  HPI  60yo F with multiple MRSA abscess requiring I x D of right chest wall/axillary region and low abdomen. She finished her IV vancomycin on Monday and continues to get daily dressing changes by the home health nurse. She states that her axilla had been doing well up until Sunday, when she had significant pain with dressing change. She denies fever, chills, but states that there is now some drainage from the area. She has had 18 days of IV abtx. Her labs on 8/21 showed wbc of 6.3, plt 651, hgb 11, sed rate 30, crp <0.5 ( WBC and platelets are decreased from 8/14)  She is worried that her wounds are not healing. She is tearful during this visit. She sees general surgery on Friday  She had some vaginal bleeding during her hospitalization which has now stopped. She has followed up with gynecology. Her vulva lesions at prior ixd that occurred the day before admit, has improved  Allergies  Allergen Reactions  . Kenalog [Triamcinolone] Anaphylaxis    Was mixed with lidocaine, unsure which caused the reaction   . Lidocaine Anaphylaxis    Was mixed with the Kenalog, unsure which medication caused the reaction Pt repots she received Lidocaine since that time without trouble.   Active Ambulatory Problems    Diagnosis Date Noted  . Insomnia 05/23/2011  . Depression 05/23/2011  . Personal history of colonic adenomas 06/12/2012  . Unspecified constipation 06/12/2012  . Family history of ovarian cancer 11/30/2012  . Abscess of vulva s/p I&D 11/23/2015  . Cellulitis of right axilla 11/23/2015  . Cellulitis of abdominal wall 11/23/2015  . Obesity (BMI 30-39.9) 11/23/2015  . Hypokalemia 11/23/2015  . Leukocytosis 11/23/2015  . MRSA (methicillin resistant staph aureus) culture positive 11/23/2015  . Cellulitis 11/24/2015  . Sepsis ([place]) 11/25/2015   . Abscess    Resolved Ambulatory Problems    Diagnosis Date Noted  . Hemorrhage of rectum and anus 06/12/2012  . Abdominal pain, other specified site 06/12/2012  . Anaphylactic shock 07/18/2015  . Anaphylactic reaction   . Cellulitis 11/23/2015   Past Medical History:  Diagnosis Date  . Abscess of vulva 11/23/2015  . Anaphylactic reaction   . Anxiety   . Arthritis   . Asthma   . Complication of anesthesia   . Constipation   . Depression   . Heart murmur   . Personal history of colonic adenomas 06/12/2012   Current Outpatient Prescriptions on File Prior to Visit  Medication Sig Dispense Refill  . Amino Acids-Protein Hydrolys (FEEDING SUPPLEMENT, PRO-STAT SUGAR FREE 64,) LIQD Take 30 mLs by mouth daily. (Patient not taking: Reported on 12/13/2015) 900 mL 0  . buPROPion (WELLBUTRIN XL) 150 MG 24 hr tablet TAKE 1 TABLET (150 MG TOTAL) BY MOUTH DAILY. 30 tablet 0  . celecoxib (CELEBREX) 200 MG capsule Take 200 mg by mouth every evening.   2  . estradiol (ESTRACE) 1 MG tablet TAKE 1 TABLET (1 MG TOTAL) BY MOUTH DAILY. 30 tablet 0  . feeding supplement, ENSURE ENLIVE, (ENSURE ENLIVE) LIQD Take 237 mLs by mouth daily. (Patient not taking: Reported on 12/13/2015) 237 mL 12  . HYDROcodone-acetaminophen (NORCO/VICODIN) 5-325 MG tablet Take 1-2 tablets by mouth every 4 (four) hours as needed for moderate pain. 30 tablet 0  . progesterone (PROMETRIUM) 100 MG capsule TAKE 1 CAPSULE (  100 MG TOTAL) BY MOUTH DAILY. 30 capsule 0  . senna (SENOKOT) 8.6 MG tablet Take 1 tablet by mouth 2 (two) times daily.     No current facility-administered medications on file prior to visit.    Social History  Substance Use Topics  . Smoking status: Never Smoker  . Smokeless tobacco: Never Used  . Alcohol use Yes     Comment: very rare  family history includes Alcohol abuse in her father; Bipolar disorder in her daughter; Hyperlipidemia in her father and mother; Leukemia in her paternal grandmother; Ovarian  cancer (age of onset: 25) in her mother.  Review of Systems No fevers, nightsweats, occ back cramping. Nausea no vomiting, no diarrhea, no yeast infection. No significant drainage from wound. More emotional of late Otherwise 10 point rosi    Objective:   Physical Exam gen = a x o by 3 who is tearful Psych = anxious for wound dressing change Skin = axilla incision has good granulation in the wound bed, slight purulent exudate on superior/medial aspect of the wound. Measures 6.2 cm by 1.5cm deep abd = lower abd incision is 6.o cm by 0.8 cm deep, good granulation tissue (appears smaller than prior hospitalization since 1cm closure noted on lateral sides of incision) Ext: left arm picc line c/d/i       Assessment & Plan:   mrsa deep tissue infection = will pull picc line and transition her to oral abtx for addn 7 days. Will do bactrim DS bid x 7 d.  To follow up with general surgery this Friday for further wound care instructions. Recommended for her to take her pain meds 30 min before her appt since they will also likely need to visualize wound and do wound change  We gave her 30mg  toradol for pain management for dressing changes.   Will pull out picc line today  Nausea = will give prn zofran to use if needed since she gets nauseated from pain medications  Spent 45 min with patient with greater than 50% direct patient care with wound care evaluation, dressing change and picc line removal rtc prn

## 2015-12-13 NOTE — Assessment & Plan Note (Signed)
She has just finished 18 days of iv vancomycin. Slight purulence noted in axillary wound bed. Will finish with 7 more days of abtx, will do bactrim ds 1 bid x 7d.

## 2016-02-06 ENCOUNTER — Ambulatory Visit (INDEPENDENT_AMBULATORY_CARE_PROVIDER_SITE_OTHER): Payer: BC Managed Care – PPO | Admitting: Family Medicine

## 2016-02-06 ENCOUNTER — Ambulatory Visit (INDEPENDENT_AMBULATORY_CARE_PROVIDER_SITE_OTHER): Payer: BC Managed Care – PPO

## 2016-02-06 VITALS — BP 112/76 | HR 105 | Temp 98.5°F | Resp 16 | Ht 61.0 in | Wt 174.0 lb

## 2016-02-06 DIAGNOSIS — M25511 Pain in right shoulder: Secondary | ICD-10-CM

## 2016-02-06 DIAGNOSIS — Z23 Encounter for immunization: Secondary | ICD-10-CM | POA: Diagnosis not present

## 2016-02-06 MED ORDER — PREDNISONE 20 MG PO TABS: ORAL_TABLET | ORAL | 0 refills | Status: DC

## 2016-02-06 MED ORDER — HYDROXYZINE HCL 25 MG PO TABS: 12.5000 mg | ORAL_TABLET | Freq: Three times a day (TID) | ORAL | 0 refills | Status: DC | PRN

## 2016-02-06 NOTE — Patient Instructions (Addendum)
Take Prednisone 20 mg,  in mornings with breakfast as follows:  Take 3 pills for 3 days, Take 2 pills for 3 days, and Take 1 pill for 3 days.  Complete all medication.  Sleep- Take Hydroxyzine 12.5-25 mg at bedtime for sleep.  I have referred you to Sports Medicine for further evaluation of shoulder pain.  IF you received an x-ray today, you will receive an invoice from [facility]. Please contact [facility] at [phone] with questions or concerns regarding your invoice.   IF you received labwork today, you will receive an invoice from [employer]. Please contact [facility] at [phone] with questions or concerns regarding your invoice.   Our billing staff will not be able to assist you with questions regarding bills from these companies.  You will be contacted with the lab results as soon as they are available. The fastest way to get your results is to activate your My Chart account. Instructions are located on the last page of this paperwork. If you have not heard from [place] regarding the results in 2 weeks, please contact this office.     Shoulder Pain The shoulder is the joint that connects your arm to your body. Muscles and band-like tissues that connect bones to muscles (tendons) hold the joint together. Shoulder pain is felt if an injury or medical problem affects one or more parts of the shoulder. HOME CARE   Put ice on the sore area.  Put ice in a plastic bag.  Place a towel between your skin and the bag.  Leave the ice on for 15-20 minutes, 03-04 times a day for the first 2 days.  Stop using cold packs if they do not help with the pain.  If you were given something to keep your shoulder from moving (sling; shoulder immobilizer), wear it as told. Only take it off to shower or bathe.  Move your arm as little as possible, but keep your hand moving to prevent puffiness (swelling).  Squeeze a soft ball or foam pad as much as  possible to help prevent swelling.  Take medicine as told by your doctor. GET HELP IF:  You have progressing new pain in your arm, hand, or fingers.  Your hand or fingers get cold.  Your medicine does not help lessen your pain. GET HELP RIGHT AWAY IF:   Your arm, hand, or fingers are numb or tingling.  Your arm, hand, or fingers are puffy (swollen), painful, or turn white or blue. MAKE SURE YOU:   Understand these instructions.  Will watch your condition.  Will get help right away if you are not doing well or get worse.   This information is not intended to replace advice given to you by your health care provider. Make sure you discuss any questions you have with your health care provider.   Document Released: 09/25/2007 Document Revised: 04/29/2014 Document Reviewed: 08/01/2014 Elsevier Interactive Patient Education [employer].

## 2016-02-06 NOTE — Progress Notes (Signed)
Patient ID: Ebony Stanley, female    DOB: [birth year], 60 y.o.   MRN: [id number]  PCP: No primary care provider on file.  Chief Complaint  Patient presents with  . Shoulder Pain    Rt., x 3 months   . Immunizations    FLU  . Wrist Pain    left    Subjective:   HPI 60 year old, female presents for evaluation of right shoulder pain time 3 months. She is familar as a patient here at [facility]. Shoulder pain has intermittently been present since July. She reports weakness from mid arm upward to her shoulder. Weakness of right arm is more prominent when lifting objects. She reports taking hydrocodone and oxycodone prescribed for pain following her hospitalization for a non-related issue in August, without relief of symptoms.  Pain is interfering with sleep.  Character of pain is described as aching to sharp. Unknown if she injured her shoulder or arm. She decided to be evaluated as she feels pain is worsening .   Social History   Social History  . Marital status: Married    Spouse name: N/A  . Number of children: 2  . 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 Smoking status: Never Smoker  . Smokeless tobacco: Never Used  . Alcohol use Yes     Comment: very rare  . Drug use: No  . Sexual activity: Yes    Birth control/ protection: Post-menopausal     Comment: G8- has 2 childen, 1sab, 4tab   Other Topics Concern  . Not on file   Social History Narrative  . No narrative on file   Family History  Problem Relation Age of Onset  . Ovarian cancer Mother 89  . Hyperlipidemia Mother   . Alcohol abuse Father   . Hyperlipidemia Father   . Leukemia Paternal Grandmother   . Bipolar disorder Daughter   . Colon cancer Neg Hx    Review of Systems See HPI    Patient Active Problem List   Diagnosis Date Noted  . MRSA infection 12/13/2015  . Abscess   . Sepsis ([place]) 11/25/2015  . Cellulitis  11/24/2015  . Abscess of vulva s/p I&D 11/23/2015  . Cellulitis of right axilla 11/23/2015  . Cellulitis of abdominal wall 11/23/2015  . Obesity (BMI 30-39.9) 11/23/2015  . Hypokalemia 11/23/2015  . Leukocytosis 11/23/2015  . MRSA (methicillin resistant staph aureus) culture positive 11/23/2015  . Family history of ovarian cancer 11/30/2012  . Personal history of colonic adenomas 06/12/2012  . Unspecified constipation 06/12/2012  . Insomnia 05/23/2011  . Depression 05/23/2011     Prior to Admission medications   Medication Sig Start Date End Date Taking? Authorizing Provider  buPROPion (WELLBUTRIN XL) 150 MG 24 hr tablet TAKE 1 TABLET (150 MG TOTAL) BY MOUTH DAILY. 12/06/13  Yes [place], MD  celecoxib (CELEBREX) 200 MG capsule Take 200 mg by mouth every evening.  10/22/15  Yes Historical Provider, MD  co-enzyme Q-10 30 MG capsule Take 30 mg by mouth 3 (three) times daily.   Yes Historical Provider, MD  estradiol (ESTRACE) 1 MG tablet TAKE 1 TABLET (1 MG TOTAL) BY MOUTH DAILY.   Yes Brook Oletta Lamas, MD  HYDROcodone-acetaminophen (NORCO/VICODIN) 5-325 MG tablet Take 1-2 tablets by mouth every 4 (four) hours as needed for moderate pain. 12/05/15  Yes Mariel Aloe, MD  IBUPROFEN  PO Take by mouth.   Yes Historical Provider, MD  MELATONIN PO Take by mouth.   Yes Historical Provider, MD  Multiple Vitamin (MULTIVITAMIN) tablet Take 1 tablet by mouth daily.   Yes Historical Provider, MD  NAPROXEN SODIUM PO Take by mouth.   Yes Historical Provider, MD  ondansetron (ZOFRAN-ODT) 8 MG disintegrating tablet Take 1 tablet (8 mg total) by mouth every 8 (eight) hours as needed for nausea or vomiting. 12/13/15  Yes Carlyle Basques, MD  progesterone (PROMETRIUM) 100 MG capsule TAKE 1 CAPSULE (100 MG TOTAL) BY MOUTH DAILY. 12/17/13  Yes Brook E Yisroel Ramming, MD  senna (SENOKOT) 8.6 MG tablet Take 1 tablet by mouth 2 (two) times daily.   Yes Historical Provider, MD     Allergies    Allergen Reactions  . Kenalog [Triamcinolone] Anaphylaxis    Was mixed with lidocaine, unsure which caused the reaction   . Lidocaine Anaphylaxis    Was mixed with the Kenalog, unsure which medication caused the reaction Pt repots she received Lidocaine since that time without trouble.       Objective:  Physical Exam  Constitutional: She appears well-developed and well-nourished.  HENT:  Head: Normocephalic and atraumatic.  Right Ear: External ear normal.  Left Ear: External ear normal.  Nose: Nose normal.  Mouth/Throat: Oropharynx is clear and moist.  Eyes: Conjunctivae and EOM are normal. Pupils are equal, round, and reactive to light.  Neck: Normal range of motion. Neck supple.  Cardiovascular: Regular rhythm, normal heart sounds and intact distal pulses.   Pulmonary/Chest: Effort normal and breath sounds normal.  Musculoskeletal:       Right shoulder: She exhibits decreased range of motion, tenderness, bony tenderness and decreased strength.  Limited internal rotation with right shoulder  Neurological:  Reflex Scores:      Tricep reflexes are 2+ on the right side and 2+ on the left side.      Bicep reflexes are 2+ on the right side and 2+ on the left side. Right shoulder strength 4/5  Left shoulder strength 5/5   Skin: Skin is warm and dry.  Psychiatric: She has a normal mood and affect. Her behavior is normal. Judgment and thought content normal.    Dg Shoulder Right  Result Date: 02/06/2016 CLINICAL DATA:  Right shoulder pain for 3 months EXAM: RIGHT SHOULDER - 2+ VIEW COMPARISON:  None. FINDINGS: Three views of the right shoulder submitted. No acute fracture or subluxation. Glenohumeral joint is preserved. Mild degenerative changes AC joint. IMPRESSION: No acute fracture or subluxation. Mild degenerative changes AC joint. Electronically Signed   By: Lahoma Crocker M.D.   On: 02/06/2016 17:03   Vitals:   02/06/16 1610  Pulse: (!) 105  Resp: 16  Temp: 98.5 F (36.9 C)    Assessment & Plan:  1. Right shoulder pain, unspecified chronicity - DG Shoulder Right - Ambulatory referral to Sports Medicine  Plan:  Digital exam revealed joint degenerative changes at the [facility] joint which is likely the cause of patients' persistent pain.  Referring to sports medicine as patient has some noticeable weakness of right extremity compared to left extremity. Will treat with shoulder with taper course of prednisone as pain has been non-responsive to potent pain medication and standard antiinflammatory medication.   -Prednisone 20 mg,  in mornings with breakfast as follows:  Take 3 pills for 3 days, Take 2 pills for 3 days, and Take 1 pill for 3 days.  Complete all medication. Discontinue taking Celebrex  while completing prednisone taper.  -Hydroxyzine 12.5-25 mg to facilitate sleep during due to shoulder pain. Follow-up as needed.  2. Influenza vaccine administered - Flu Vaccine QUAD 36+ mos IM  Carroll Sage. Kenton Kingfisher, MSN, FNP-C [facility]

## 2016-02-20 ENCOUNTER — Ambulatory Visit: Payer: BC Managed Care – PPO | Admitting: Family Medicine

## 2016-02-21 ENCOUNTER — Ambulatory Visit: Payer: Self-pay

## 2016-02-21 ENCOUNTER — Encounter: Payer: Self-pay | Admitting: Student

## 2016-02-21 ENCOUNTER — Ambulatory Visit (INDEPENDENT_AMBULATORY_CARE_PROVIDER_SITE_OTHER): Payer: BC Managed Care – PPO | Admitting: Student

## 2016-02-21 VITALS — BP 135/79 | Ht 61.0 in | Wt 173.0 lb

## 2016-02-21 DIAGNOSIS — M19011 Primary osteoarthritis, right shoulder: Secondary | ICD-10-CM

## 2016-02-21 DIAGNOSIS — M7581 Other shoulder lesions, right shoulder: Secondary | ICD-10-CM | POA: Diagnosis not present

## 2016-02-21 DIAGNOSIS — M25511 Pain in right shoulder: Secondary | ICD-10-CM

## 2016-02-21 MED ORDER — METHYLPREDNISOLONE ACETATE 40 MG/ML IJ SUSP
40.0000 mg | Freq: Once | INTRAMUSCULAR | Status: AC
  Administered 2016-02-21: 40 mg via INTRA_ARTICULAR

## 2016-02-21 NOTE — Assessment & Plan Note (Addendum)
Injection performed to the right subacromial space. Did review risks with patient as she has had an anaphylactic reaction to an injection previously. Gave red flag signs for anaphylaxis which she is aware of. It is likely a reaction to the agents that will liquefy Kenalog versus actual Kenalog itself, discussed with Dr. Oneida Alar. She has tolerated lidocaine the past. Reviewed risks benefits for a Solu-Medrol injection. She elected to go forth with Solu-Medrol injection. Exercises for rotator cuff given to patient. Did ultrasound the [facility] joint which did show some arthritic changes and a mild effusion. Believe this to be also causing her issues but her rotator cuff seems to be bothering her more. Would consider injecting her in the future if she had no reactions to this injection. Follow-up in 4 weeks to reassess.

## 2016-02-21 NOTE — Progress Notes (Signed)
Ebony Stanley - 60 y.o. female MRN [id number]  Date of birth: 1956/03/23  SUBJECTIVE:  Including CC & ROS.  CC: right shoulder pain  Presents with right shoulder pain that is ongoing since July. She reports problems with overhead activities and extension.  Denies any specific injury to the shoulder. It began over the summer while she was being hospitalized for MRSA infection. And has not gone away. She has not been doing any exercises for it. She has been taking Celebrex for [facility] joint arthritis without relief. She was given a prednisone taper without relief but it did help mildly. Denies any numbness or tingling distally. She does report pain on the lateral aspect of her arm. She does have a history of anaphylaxis after a Kenalog injection. She is taken lidocaine in the past but had not use Kenalog.    ROS: No unexpected weight loss, fever, chills, swelling, instability, muscle pain, numbness/tingling, redness, otherwise see HPI   PMHx - Updated and reviewed.  Contributory factors include: Anaphylaxis to Kenalog, depression, MRSA cellulitis PSHx - Updated and reviewed.  Contributory factors include:  Negative FHx - Updated and reviewed.  Contributory factors include:  Negative Social Hx - Updated and reviewed. Contributory factors include: Negative Medications - reviewed   DATA REVIEWED:  urgent care visits and shoulder x-ray showing right acromioclavicular joint arthritis  PHYSICAL EXAM:  VS: BP:135/79  HR: bpm  TEMP: ( )  RESP:   HT:5\' 1"  (154.9 cm)   WT:173 lb (78.5 kg)  BMI:32.8 PHYSICAL EXAM: Gen: NAD, alert, cooperative with exam, well-appearing HEENT: clear conjunctiva,  CV:  no edema, capillary refill brisk, normal rate Resp: non-labored Skin: no rashes, normal turgor  Neuro: no gross deficits.  Psych:  alert and oriented  Shoulder: Inspection reveals no abnormalities, atrophy or asymmetry. Palpation is with tenderness over AC joint. ROM is full in all planes, but has  pain with flexion and internal rotation. Rotator cuff strength normal throughout. Positive signs of impingement with positive Neer and Hawkin's tests, empty can sign. Speeds and Yergason's tests normal. No labral pathology noted with negative Obrien's, negative clunk and good stability. Abnormal scapular function observed, dyskinesis observed. Positive painful arc and no drop arm sign. No apprehension sign  Imaging: [place] image of the R shoulder in both long and short axis obtained. Biceps tendon appears normal fibrillar pattern without surrounding effusion. Subscapularis appears normal without obvious tears or abnormalities. The supraspinatus tendon appears to have chronic calcifications but with no tears and a good footprint.  The infraspinatus and teres minor tendons appear to have chronic calcifications but with no tears and a good footprints. The subdeltoid/subacromial bursa is unremarkable and shows no impingement with dynamic motion. The [facility] joint appears to have ample joint space, bone spurring is present and there is a mild effusion. Findings consistent with rotator cuff tendinitis and acromioclavicular joint arthritis      ASSESSMENT & PLAN:   Right rotator cuff tendinitis Injection performed to the right subacromial space. Did review risks with patient as she has had an anaphylactic reaction to an injection previously. Gave red flag signs for anaphylaxis which she is aware of. It is likely a reaction to the agents that will liquefy Kenalog versus actual Kenalog itself. She has tolerated lidocaine the past. Reviewed risks benefits for a Solu-Medrol injection. She elected to go forth with Solu-Medrol injection. Exercises for rotator cuff given to patient. Did ultrasound the [facility] joint which did show some arthritic changes and a mild effusion. Believe this  to be also causing her issues but her rotator cuff seems to be bothering her more. Would consider injecting her in the future if she had no  reactions to this injection. Follow-up in 4 weeks to reassess.  Procedure:  Injection of right subacromial Consent obtained and verified. Time-out conducted. Noted no overlying erythema, induration, or other signs of local infection. Skin prepped in a sterile fashion. Topical analgesic spray: Ethyl chloride. Completed without difficulty. Meds: 40 mg Depo-Medrol, 3 mL 1% lidocaine Pain immediately improved suggesting accurate placement of the medication. Advised to call if fevers/chills, erythema, induration, drainage, or persistent bleeding.

## 2016-03-04 ENCOUNTER — Telehealth: Payer: Self-pay

## 2016-03-04 ENCOUNTER — Telehealth: Payer: Self-pay | Admitting: Student

## 2016-03-04 NOTE — Telephone Encounter (Signed)
Pt is needing to get a copy of her right shoulder xray  Please call [phone] once ready to pick up

## 2016-03-04 NOTE — Telephone Encounter (Signed)
Called pt and verified name and DOB.  Received call from pt that having unrelenting pain despite injection.  Offered having her come in for [facility] joint injection vs MRI.  Doing HEP, but doing worse.  Could consider PT.  She will call insurance about MRI cost.  Has done prednisone without relief.  She will call back tomorrow with the direction she would like to proceed.   Signed,  Balinda Quails, DO [facility] 4:49 PM 03/04/16

## 2016-03-20 ENCOUNTER — Ambulatory Visit: Payer: BC Managed Care – PPO | Admitting: Student

## 2016-03-22 NOTE — Telephone Encounter (Signed)
Ebony Stanley called pt - CD with shoulder xray on it ready for pick up.

## 2016-05-01 ENCOUNTER — Other Ambulatory Visit: Payer: Self-pay | Admitting: Infectious Disease

## 2016-05-03 ENCOUNTER — Telehealth: Payer: Self-pay | Admitting: *Deleted

## 2016-05-03 NOTE — Telephone Encounter (Signed)
Patient called stating she is having a flare up of a boil, which Dr. Baxter Flattery had previously treated her for. She is asking for an Rx for doxycycline. Advised her I would send a note to MD and if it worsens she should see her PCP. She does not currently have a PCP, so I advised her to go to an Urgent Care if needed. Please advise on Rx. [facility] uses [facility] [address]. Myrtis Hopping

## 2016-05-03 NOTE — Telephone Encounter (Signed)
I remember her from when I saw her recently. Please send in rx for her doxy 100mg  BID x 7 days.

## 2016-05-06 ENCOUNTER — Other Ambulatory Visit: Payer: Self-pay | Admitting: *Deleted

## 2016-05-06 MED ORDER — DOXYCYCLINE HYCLATE 100 MG PO TABS: 100.0000 mg | ORAL_TABLET | Freq: Two times a day (BID) | ORAL | 0 refills | Status: DC

## 2016-05-06 NOTE — Telephone Encounter (Signed)
Rx sent and patient notified.

## 2016-06-17 ENCOUNTER — Encounter: Payer: Self-pay | Admitting: Family Medicine

## 2016-06-17 ENCOUNTER — Ambulatory Visit (INDEPENDENT_AMBULATORY_CARE_PROVIDER_SITE_OTHER): Payer: BC Managed Care – PPO | Admitting: Family Medicine

## 2016-06-17 VITALS — BP 124/82 | HR 86 | Temp 98.6°F | Resp 16 | Ht 61.0 in | Wt 169.0 lb

## 2016-06-17 DIAGNOSIS — G4700 Insomnia, unspecified: Secondary | ICD-10-CM | POA: Diagnosis not present

## 2016-06-17 DIAGNOSIS — F39 Unspecified mood [affective] disorder: Secondary | ICD-10-CM | POA: Diagnosis not present

## 2016-06-17 DIAGNOSIS — Z1389 Encounter for screening for other disorder: Secondary | ICD-10-CM

## 2016-06-17 DIAGNOSIS — D649 Anemia, unspecified: Secondary | ICD-10-CM

## 2016-06-17 DIAGNOSIS — Z1383 Encounter for screening for respiratory disorder NEC: Secondary | ICD-10-CM | POA: Diagnosis not present

## 2016-06-17 DIAGNOSIS — Z1329 Encounter for screening for other suspected endocrine disorder: Secondary | ICD-10-CM | POA: Diagnosis not present

## 2016-06-17 DIAGNOSIS — Z136 Encounter for screening for cardiovascular disorders: Secondary | ICD-10-CM

## 2016-06-17 DIAGNOSIS — Z8679 Personal history of other diseases of the circulatory system: Secondary | ICD-10-CM | POA: Diagnosis not present

## 2016-06-17 DIAGNOSIS — Z0000 Encounter for general adult medical examination without abnormal findings: Secondary | ICD-10-CM

## 2016-06-17 LAB — POCT URINALYSIS DIP (MANUAL ENTRY)
GLUCOSE UA: NEGATIVE
Leukocytes, UA: NEGATIVE
NITRITE UA: NEGATIVE
Spec Grav, UA: >=1.03
UROBILINOGEN UA: 0.2
pH, UA: 5.5

## 2016-06-17 MED ORDER — AMITRIPTYLINE HCL 10 MG PO TABS: 10.0000 mg | ORAL_TABLET | Freq: Every day | ORAL | 2 refills | Status: DC

## 2016-06-17 NOTE — Progress Notes (Signed)
Subjective:    Patient ID: Ebony Stanley, female    DOB: [birth year], 61 y.o.   MRN: [id number] Chief Complaint  Patient presents with  . Annual Exam    no pap    HPI  Ebony Stanley is a 61 yo woman here for a complete physical.  This is my first time meeting this patient.  She has had a very difficult year with 3 hospitalizations. Her last CPE was 5 years prior though she has seen gyn in the interim rarely for well-woman care.  She had a very scary episode with anaphylactic reaction after a joint injection in Dr. Bertis Ruddy office.   3 mos after she developed "full-blown MRSA."  Appear like little bites - are on perineum and buttocks.  She did see Infectious disease and states that they told her to have her PCP call this in whenever needed.  Did have rotator cuff surgery in 12/18 - 9 wks ago - so she is still doing PT.  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   Primary Preventative Screenings: Cervical Cancer: normal pap though TZ absent 12/2013 by Dr. Bobbye Charleston at [facility], hpv testing not done. She did have some vaginal bleeding during a hospitalization 11/2016 (about 6 mos prior) and follow-up with gyn for this.  She did have an [place] and endometrial biopsy with a D&C in 2016. She has been on estradiol and progesterone for a long time.   STI screening: Breast Cancer: mammogram normal/neg 01/2015 and repeated in 2017 ordered by gyn Dr. Philis Pique (done at [facility] office) Colorectal Cancer: Colonoscopy 05/2012 by Dr. Darci Current at [place] with a 16mm adenoma so repeat 5 years 2019 Tobacco use: none Bone Density: none Cardiac: none Weight/blood sugar: hgba1c 5.5  7 mos prior OTC/vit/supp/herbal: mvi, ca/mag/zinc, K so she doesn't cramp.  Rt leg cramping severe sev mos ago since her last hosp.  She had been on celebrex prior and was able to wean herself off all meds.   She was still having wrist and shoulder pain so celebrex which she had been on for years and didn't seem to work. She did try to alternative ibuprofen and naproxen.  She takes naproxen 1 bid, and ibuprofen 1-2 in the middle of the day if having more pain. Diet/Exercise/EtOH/substances: Dentist/Optho: Immunizations: had flu shot this season  Chronic Medical Conditions: Right rotator cuff tendinitis: seeing sports medicine 3 mos ago - Dr. Caprice Red who did cortisone injection under US guidance, pred offered no relief. Recurrent abscess: Seeing infectious disease Dr. Baxter Flattery - last treated with 1 wk of doxycycline 6 wks prior. Also saw general surgery. She was hospitalized and sent home with a PICC line for IV vanc 6 mos prior.  Chronic constipation: takes veggie laxative Depression: Has been on low dose of wellbutrin for a very long time. Has trouble sleeping. Denies needing refill. Wonders if there is something more effective. Remembers she was on something else once for this but doesn't remember. Takes melatonin but develops a tolerance after several days.   Depression screen [facility] 2/9 02/06/2016 06/28/2015  Decreased Interest 0 0  Down, Depressed, Hopeless 0 0  PHQ - 2 Score 0 0   Past Medical History:  Diagnosis Date  . Abscess of vulva 11/23/2015  . Anaphylactic reaction   . Anxiety   . Arthritis   . Asthma  when pregnant  . Complication of anesthesia    unknown if Lidocaine was the cause of the anaphylaxis  . Constipation   . Depression   . Heart murmur    as child  . Personal history of colonic adenomas 06/12/2012   history in 2011 with 2 adenomas Ferdinand Lango) 06/15/2012 cecal polyp 7 mm adenoma     Past Surgical History:  Procedure Laterality Date  . [place]  . INCISION AND DRAINAGE ABSCESS N/A 11/20/2015   Procedure: INCISION AND DRAINAGE ABSCESS Vulva;  Surgeon: Jerelyn Charles, MD;  Location: [facility];  Service: Gynecology;  Laterality: N/A;  . IRRIGATION AND  DEBRIDEMENT ABSCESS Right 11/27/2015   Procedure: IRRIGATION AND DEBRIDEMENT RIGHT AXILLARY ABSCESS AND ABDOMINAL WALL;  Surgeon: Alphonsa Overall, MD;  Location: [facility];  Service: General;  Laterality: Right;  . KNEE ARTHROSCOPY Right    x 2  . TONSILLECTOMY    . WRIST SURGERY     Current Outpatient Prescriptions on File Prior to Visit  Medication Sig Dispense Refill  . buPROPion (WELLBUTRIN XL) 150 MG 24 hr tablet TAKE 1 TABLET (150 MG TOTAL) BY MOUTH DAILY. 30 tablet 0  . estradiol (ESTRACE) 1 MG tablet TAKE 1 TABLET (1 MG TOTAL) BY MOUTH DAILY. 30 tablet 0  . IBUPROFEN PO Take by mouth.    . MELATONIN PO Take by mouth.    . Multiple Vitamin (MULTIVITAMIN) tablet Take 1 tablet by mouth daily.    Marland Kitchen NAPROXEN SODIUM PO Take by mouth.    . progesterone (PROMETRIUM) 100 MG capsule TAKE 1 CAPSULE (100 MG TOTAL) BY MOUTH DAILY. 30 capsule 0  . co-enzyme Q-10 30 MG capsule Take 30 mg by mouth 3 (three) times daily.    Marland Kitchen senna (SENOKOT) 8.6 MG tablet Take 1 tablet by mouth 2 (two) times daily.     No current facility-administered medications on file prior to visit.    Allergies  Allergen Reactions  . Kenalog [Triamcinolone] Anaphylaxis    Was mixed with lidocaine, unsure which caused the reaction    Family History  Problem Relation Age of Onset  . Ovarian cancer Mother 74  . Hyperlipidemia Mother   . Alcohol abuse Father   . Hyperlipidemia Father   . Leukemia Paternal Grandmother   . Bipolar disorder Daughter   . Colon cancer Neg Hx    Social History   Social History  . Marital status: Married    Spouse name: N/A  . Number of children: 2  . 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 Smoking status: Never Smoker  . Smokeless tobacco: Never Used  . Alcohol use Yes     Comment: very rare  . Drug use: No  . Sexual activity: Yes    Birth control/ protection: Post-menopausal     Comment: G8- has 2  childen, 1sab, 4tab   Other Topics Concern  . None   Social History Narrative  . None    Review of Systems See hpi    Objective:   Physical Exam  Constitutional: She is oriented to person, place, and time. She appears well-developed and well-nourished. No distress.  HENT:  Head: Normocephalic and atraumatic.  Right Ear: Tympanic membrane, external ear and ear canal normal.  Left Ear: Tympanic membrane, external ear and ear canal normal.  Nose: Nose normal. No mucosal edema or rhinorrhea.  Mouth/Throat: Uvula is midline, oropharynx is clear  and moist and mucous membranes are normal. No posterior oropharyngeal erythema.  Eyes: Conjunctivae and EOM are normal. Pupils are equal, round, and reactive to light. Right eye exhibits no discharge. Left eye exhibits no discharge. No scleral icterus.  Neck: Normal range of motion. Neck supple. No thyromegaly present.  Cardiovascular: Normal rate, regular rhythm, normal heart sounds and intact distal pulses.   Pulmonary/Chest: Effort normal and breath sounds normal. No respiratory distress.  Abdominal: Soft. Bowel sounds are normal. There is no tenderness.  Musculoskeletal: She exhibits no edema.  Lymphadenopathy:    She has no cervical adenopathy.  Neurological: She is alert and oriented to person, place, and time. She has normal reflexes.  Skin: Skin is warm and dry. She is not diaphoretic. No erythema.  Psychiatric: She has a normal mood and affect. Her behavior is normal.    BP 124/82   Pulse 86   Temp 98.6 F (37 C) (Oral)   Resp 16   Ht 5\' 1"  (1.549 m)   Wt 169 lb (76.7 kg)   LMP 04/22/2006   BMI 31.93 kg/m     [facility] reading (PRIMARY) by  Dr. Brigitte Pulse. EKG: NSR, no acute anemic change or signs of strain  Results for orders placed or performed in visit on 06/17/16  POCT urinalysis dipstick  Result Value Ref Range   Color, UA yellow yellow   Clarity, UA clear clear   Glucose, UA negative negative   Bilirubin, UA moderate (A)  negative   Ketones, POC UA small (15) (A) negative   Spec Grav, UA >=1.030    Blood, UA trace-intact (A) negative   pH, UA 5.5    Protein Ur, POC trace (A) negative   Urobilinogen, UA 0.2    Nitrite, UA Negative Negative   Leukocytes, UA Negative Negative    Assessment & Plan:  Need tdap, consider pneumonia shot due to recurrent hosp and infxns - seeing infectious disease Mammogram - will have done at [facility] office, have them send [place] a copy. Will be due to repeat pap in Sept 2018  Cbc with diff,( last anemic), cmp, lipid, tsh, ua  1. Annual physical exam   2. Screening for cardiovascular, respiratory, and genitourinary diseases   3. Screening for thyroid disorder   4. Anemia, unspecified type   5. H/O atrial flutter   6. Insomnia, unspecified type   7. Mood disorder ([place])     Orders Placed This Encounter  Procedures  . CBC with Differential/Platelet  . Comprehensive metabolic panel    Order Specific Question:   Has the patient fasted?    Answer:   Yes  . TSH  . Lipid panel    Order Specific Question:   Has the patient fasted?    Answer:   Yes  . POCT urinalysis dipstick  . EKG 12-Lead    Meds ordered this encounter  Medications  . traMADol (ULTRAM) 50 MG tablet    Sig: Take by mouth every 6 (six) hours as needed.  Marland Kitchen amitriptyline (ELAVIL) 10 MG tablet    Sig: Take 1 tablet (10 mg total) by mouth at bedtime.    Dispense:  30 tablet    Refill:  2    Delman Cheadle, M.D.  Primary Care at [facility] [address] [place], [place] [address] [phone] phone [phone] fax  07/19/16 2:43 AM

## 2016-06-17 NOTE — Patient Instructions (Addendum)
IF you received an x-ray today, you will receive an invoice from [facility]. Please contact [facility] at [phone] with questions or concerns regarding your invoice.   IF you received labwork today, you will receive an invoice from [place]. Please contact [facility] at [phone] with questions or concerns regarding your invoice.   Our billing staff will not be able to assist you with questions regarding bills from these companies.  You will be contacted with the lab results as soon as they are available. The fastest way to get your results is to activate your My Chart account. Instructions are located on the last page of this paperwork. If you have not heard from [place] regarding the results in 2 weeks, please contact this office.     Calcium Intake Recommendations Introduction Calcium is a mineral that affects many functions in the body, including:  Blood clotting.  Blood vessel function.  Nerve impulse conduction.  Hormone secretion.  Muscle contraction.  Bone and teeth functions. Most of your body's calcium supply is stored in your bones and teeth. When your calcium stores are low, you may be at risk for low bone mass, bone loss, and bone fractures. Consuming enough calcium helps to grow healthy bones and teeth and to prevent breakdown over time. It is very important that you get enough calcium if you are:  A child undergoing rapid growth.  An adolescent girl.  A pre- or post-menopausal woman.  A woman whose menstrual cycle has stopped due to anorexia nervosa or regular intense exercise.  An individual with lactose intolerance or a milk allergy.  A vegetarian. What is my plan? Try to consume the recommended amount of calcium daily based on your age. Depending on your overall health, your health care provider may recommend increased calcium intake.General daily calcium intake recommendations by age are:  Birth to 6 months: 200 mg.  Infants  7 to 12 months: 260 mg.  Children 1 to 3 years: 700 mg.  Children 4 to 8 years: 1,000 mg.  Children 9 to 13 years: 1,300 mg.  Teens 14 to 18 years: 1,300 mg.  Adults 19 to 50 years: 1,000 mg.  Adult women 51 to 70 years: 1,200 mg.  Adult men 51 to 70 years: 1,000 mg.  Adults 71 years and older: 1,200 mg.  Pregnant and breastfeeding teens: 1,300 mg.  Pregnant and breastfeeding adults: 1,000 mg. What do I need to know about calcium intake?  In order for the body to absorb calcium, it needs vitamin D. You can get vitamin D through:  Direct exposure of the skin to sunlight.  Foods, such as egg yolks, liver, saltwater fish, and fortified milk.  Supplements.  Consuming too much calcium may cause:  Constipation.  Decreased absorption of iron and zinc.  Kidney stones.  Calcium supplements may interact with certain medicines. Check with your health care provider before starting any calcium supplements.  Try to get most of your calcium from food. What foods can I eat? Grains  Fortified oatmeal. Fortified ready-to-eat cereals. Fortified frozen waffles. Vegetables  Turnip greens. Broccoli. Fruits  Fortified orange juice. Meats and Other Protein Sources  Canned sardines with bones. Canned salmon with bones. Soy beans. Tofu. Baked beans. Almonds. [place] nuts. Sunflower seeds. Dairy  Milk. Yogurt. Cheese. Cottage cheese. Beverages  Fortified soy milk. Fortified rice milk. Sweets/Desserts  Pudding. Ice Cream. Milkshakes. Blackstrap molasses. The items listed above may not be a complete list of recommended foods or beverages. Contact your  dietitian for more options.  What foods can affect my calcium intake? It may be more difficult for your body to use calcium or calcium may leave your body more quickly if you consume large amounts of:  Sodium.  Protein.  Caffeine.  Alcohol. This information is not intended to replace advice given to you by your health care  provider. Make sure you discuss any questions you have with your health care provider. Document Released: 11/21/2003 Document Revised: 10/27/2015 Document Reviewed: 09/14/2013  2017 Elsevier  Keeping You Healthy  Get These Tests  Blood Pressure- Have your blood pressure checked by your healthcare provider at least once a year.  Normal blood pressure is 120/80.  Weight- Have your body mass index (BMI) calculated to screen for obesity.  BMI is a measure of body fat based on height and weight.  You can calculate your own BMI at [employer]  Cholesterol- Have your cholesterol checked every year.  Diabetes- Have your blood sugar checked every year if you have high blood pressure, high cholesterol, a family history of diabetes or if you are overweight.  Pap Test - Have a pap test every 1 to 5 years if you have been sexually active.  If you are older than 65 and recent pap tests have been normal you may not need additional pap tests.  In addition, if you have had a hysterectomy  for benign disease additional pap tests are not necessary.  Mammogram-Yearly mammograms are essential for early detection of breast cancer  Screening for Colon Cancer- Colonoscopy starting at age 46. Screening may begin sooner depending on your family history and other health conditions.  Follow up colonoscopy as directed by your Gastroenterologist.  Screening for Osteoporosis- Screening begins at age 65 with bone density scanning, sooner if you are at higher risk for developing Osteoporosis.  Get these medicines  Calcium with Vitamin D- Your body requires 1200-1500 mg of Calcium a day and [phone] IU of Vitamin D a day.  You can only absorb 500 mg of Calcium at a time therefore Calcium must be taken in 2 or 3 separate doses throughout the day.  Hormones- Hormone therapy has been associated with increased risk for certain cancers and heart disease.  Talk to your healthcare provider about if you need relief  from menopausal symptoms.  Aspirin- Ask your healthcare provider about taking Aspirin to prevent Heart Disease and Stroke.  Get these Immuniztions  Flu shot- Every fall  Pneumonia shot- Once after the age of 93; if you are younger ask your healthcare provider if you need a pneumonia shot.  Tetanus- Every ten years.  Zostavax- Once after the age of 46 to prevent shingles.  Take these steps  Don't smoke- Your healthcare provider can help you quit. For tips on how to quit, ask your healthcare provider or go to www.smokefree.gov or call 1-800 QUIT-NOW.  Be physically active- Exercise 5 days a week for a minimum of 30 minutes.  If you are not already physically active, start slow and gradually work up to 30 minutes of moderate physical activity.  Try walking, dancing, bike riding, swimming, etc.  Eat a healthy diet- Eat a variety of healthy foods such as fruits, vegetables, whole grains, low fat milk, low fat cheeses, yogurt, lean meats, chicken, fish, eggs, dried beans, tofu, etc.  For more information go to www.thenutritionsource.org  Dental visit- Brush and floss teeth twice daily; visit your dentist twice a year.  Eye exam- Visit your Optometrist or Ophthalmologist yearly.  Drink alcohol in moderation- Limit alcohol intake to one drink or less a day.  Never drink and drive.  Depression- Your emotional health is as important as your physical health.  If you're feeling down or losing interest in things you normally enjoy, please talk to your healthcare provider.  Seat Belts- can save your life; always wear one  Smoke/Carbon Monoxide detectors- These detectors need to be installed on the appropriate level of your home.  Replace batteries at least once a year.  Violence- If anyone is threatening or hurting you, please tell your healthcare provider.  Living Will/ Health care power of attorney- Discuss with your healthcare provider and family.

## 2016-06-18 LAB — COMPREHENSIVE METABOLIC PANEL
A/G RATIO: 1.8 (ref 1.2–2.2)
ALBUMIN: 4.4 g/dL (ref 3.5–5.5)
ALK PHOS: 62 IU/L (ref 39–117)
ALT: 33 IU/L — ABNORMAL HIGH (ref 0–32)
AST: 27 IU/L (ref 0–40)
BUN / CREAT RATIO: 16 (ref 9–23)
BUN: 13 mg/dL (ref 6–24)
Bilirubin Total: 0.3 mg/dL (ref 0.0–1.2)
CO2: 22 mmol/L (ref 18–29)
Calcium: 9.5 mg/dL (ref 8.7–10.2)
Chloride: 101 mmol/L (ref 96–106)
Creatinine, Ser: 0.81 mg/dL (ref 0.57–1.00)
GFR calc Af Amer: 92 mL/min/{1.73_m2} (ref >59)
GFR, EST NON AFRICAN AMERICAN: 80 mL/min/{1.73_m2} (ref >59)
GLOBULIN, TOTAL: 2.5 g/dL (ref 1.5–4.5)
Glucose: 94 mg/dL (ref 65–99)
POTASSIUM: 4.1 mmol/L (ref 3.5–5.2)
SODIUM: 142 mmol/L (ref 134–144)
Total Protein: 6.9 g/dL (ref 6.0–8.5)

## 2016-06-18 LAB — CBC WITH DIFFERENTIAL/PLATELET
BASOS: 1 %
Basophils Absolute: 0 10*3/uL (ref 0.0–0.2)
EOS (ABSOLUTE): 0.1 10*3/uL (ref 0.0–0.4)
Eos: 2 %
HEMOGLOBIN: 13.5 g/dL (ref 11.1–15.9)
Hematocrit: 39.2 % (ref 34.0–46.6)
Immature Grans (Abs): 0 10*3/uL (ref 0.0–0.1)
Immature Granulocytes: 0 %
LYMPHS ABS: 2.8 10*3/uL (ref 0.7–3.1)
Lymphs: 45 %
MCH: 30.7 pg (ref 26.6–33.0)
MCHC: 34.4 g/dL (ref 31.5–35.7)
MCV: 89 fL (ref 79–97)
MONOS ABS: 0.4 10*3/uL (ref 0.1–0.9)
Monocytes: 6 %
NEUTROS ABS: 3 10*3/uL (ref 1.4–7.0)
Neutrophils: 46 %
Platelets: 485 10*3/uL — ABNORMAL HIGH (ref 150–379)
RBC: 4.4 x10E6/uL (ref 3.77–5.28)
RDW: 14.1 % (ref 12.3–15.4)
WBC: 6.3 10*3/uL (ref 3.4–10.8)

## 2016-06-18 LAB — LIPID PANEL
Chol/HDL Ratio: 4.2 ratio units (ref 0.0–4.4)
Cholesterol, Total: 167 mg/dL (ref 100–199)
HDL: 40 mg/dL (ref >39)
LDL Calculated: 104 mg/dL — ABNORMAL HIGH (ref 0–99)
TRIGLYCERIDES: 113 mg/dL (ref 0–149)
VLDL Cholesterol Cal: 23 mg/dL (ref 5–40)

## 2016-06-18 LAB — TSH: TSH: 2.08 u[IU]/mL (ref 0.450–4.500)

## 2016-07-22 ENCOUNTER — Ambulatory Visit (INDEPENDENT_AMBULATORY_CARE_PROVIDER_SITE_OTHER): Payer: BC Managed Care – PPO | Admitting: Physician Assistant

## 2016-07-22 VITALS — BP 128/82 | HR 83 | Temp 98.2°F | Resp 14 | Ht 60.0 in | Wt 162.0 lb

## 2016-07-22 DIAGNOSIS — M545 Low back pain, unspecified: Secondary | ICD-10-CM

## 2016-07-22 DIAGNOSIS — M62838 Other muscle spasm: Secondary | ICD-10-CM

## 2016-07-22 LAB — POCT URINALYSIS DIP (MANUAL ENTRY)
BILIRUBIN UA: NEGATIVE
Bilirubin, UA: NEGATIVE
Blood, UA: NEGATIVE
GLUCOSE UA: NEGATIVE
LEUKOCYTES UA: NEGATIVE
Nitrite, UA: NEGATIVE
PROTEIN UA: NEGATIVE
Spec Grav, UA: 1.03 (ref 1.030–1.035)
Urobilinogen, UA: 0.2 (ref ?–2.0)
pH, UA: 6 (ref 5.0–8.0)

## 2016-07-22 LAB — POC MICROSCOPIC URINALYSIS (UMFC): MUCUS RE: ABSENT

## 2016-07-22 MED ORDER — NAPROXEN 500 MG PO TABS: 500.0000 mg | ORAL_TABLET | Freq: Two times a day (BID) | ORAL | 0 refills | Status: DC

## 2016-07-22 MED ORDER — CYCLOBENZAPRINE HCL 5 MG PO TABS: 5.0000 mg | ORAL_TABLET | Freq: Three times a day (TID) | ORAL | 0 refills | Status: DC | PRN

## 2016-07-22 NOTE — Patient Instructions (Addendum)
I recommend resting today. However, tomorrow I would begin walking and moving around as much as tolerated. Begin stretching in a couple of days. The worse thing you can do for low back pain is lie in bed all day or sit down all day. Use medications as needed.   Just to know, flexeril can cause side effects that may impair your thinking or reactions. Be careful if you drive or do anything that requires you to be awake and alert. void drinking alcohol, which can increase some of the side effects of Flexeril.  NSAIDs like naproxen have common side effects of heartburn, stomach pain, indigestion, and headache. Could lead to renal insufficiency, stroke, or GI bleed if taken excess amounts outside of what is recommended on label long term.    You should avoid heavy lifting or strenuous repetitive activity to prevent recurrence of event.   Use heat pad , do not apply directly to skin, use barrier such as towel over the skin. Leave on for 15-20 minutes, 3-4 times a day.  Please perform exercises below. Stretches are to be performed for 2 sets, holding 10-15 seconds each. Recommended to perform this rehab twice daily within pain tolerance for 2 weeks.  -Return to clinic if symptoms worsen, do not improve in 7 days, or as needed   [place]: STRETCH - Flexion, Single Knee to Chest   Lie on a firm bed or floor with both legs extended in front of you.  Keeping one leg in contact with the floor, bring your opposite knee to your chest. Hold your leg in place by either grabbing behind your thigh or at your knee.  Pull until you feel a gentle stretch in your lower back.   Slowly release your grasp and repeat the exercise with the opposite side.  STRETCH - Flexion, Double Knee to Chest   Lie on a firm bed or floor with both legs extended in front of you.  Keeping one leg in contact with the floor, bring your opposite knee to your chest.  Tense your stomach  muscles to support your back and then lift your other knee to your chest. Hold your legs in place by either grabbing behind your thighs or at your knees.  Pull both knees toward your chest until you feel a gentle stretch in your lower back.   Tense your stomach muscles and slowly return one leg at a time to the floor.  STRETCH - Low Trunk Rotation  Lie on a firm bed or floor. Keeping your legs in front of you, bend your knees so they are both pointed toward the ceiling and your feet are flat on the floor.  Extend your arms out to the side. This will stabilize your upper body by keeping your shoulders in contact with the floor.  Gently and slowly drop both knees together to one side until you feel a gentle stretch in your lower back.   Tense your stomach muscles to support your lower back as you bring your knees back to the starting position. Repeat the exercise to the other side.   EXTENSION RANGE OF MOTION AND FLEXIBILITY EXERCISES: STRETCH - Extension, Prone on Elbows   Lie on your stomach on the floor, a bed will be too soft. Place your palms about shoulder width apart and at the height of your head.  Place your elbows under your shoulders. If this is too painful, stack pillows under your chest.  Allow your  body to relax so that your hips drop lower and make contact more completely with the floor.  Slowly return to lying flat on the floor.  RANGE OF MOTION - Extension, Prone Press Ups  Lie on your stomach on the floor, a bed will be too soft. Place your palms about shoulder width apart and at the height of your head.  Keeping your back as relaxed as possible, slowly straighten your elbows while keeping your hips on the floor. You may adjust the placement of your hands to maximize your comfort. As you gain motion, your hands will come more underneath your shoulders.  Slowly return to lying flat on the floor.  RANGE OF MOTION- Quadruped, Neutral Spine   Assume a hands and knees  position on a firm surface. Keep your hands under your shoulders and your knees under your hips. You may place padding under your knees for comfort.  Drop your head and point your tail bone toward the ground below you. This will round out your lower back like an angry cat.    Slowly lift your head and release your tail bone so that your back sags into a large arch, like an old horse.  Repeat this until you feel limber in your lower back.  Now, find your "sweet spot." This will be the most comfortable position somewhere between the two previous positions. This is your neutral spine. Once you have found this position, tense your stomach muscles to support your lower back.  STRENGTHENING EXERCISES - Low Back Strain These exercises may help you when beginning to rehabilitate your injury. These exercises should be done near your "sweet spot." This is the neutral, low-back arch, somewhere between fully rounded and fully arched, that is your least painful position. When performed in this safe range of motion, these exercises can be used for people who have either a flexion or extension based injury. These exercises may resolve your symptoms with or without further involvement from your physician, physical therapist or athletic trainer. While completing these exercises, remember:   Muscles can gain both the endurance and the strength needed for everyday activities through controlled exercises.  Complete these exercises as instructed by your physician, physical therapist or athletic trainer. Increase the resistance and repetitions only as guided.  You may experience muscle soreness or fatigue, but the pain or discomfort you are trying to eliminate should never worsen during these exercises. If this pain does worsen, stop and make certain you are following the directions exactly. If the pain is still present after adjustments, discontinue the exercise until you can discuss the trouble with your  caregiver.  STRENGTHENING - Deep Abdominals, Pelvic Tilt  Lie on a firm bed or floor. Keeping your legs in front of you, bend your knees so they are both pointed toward the ceiling and your feet are flat on the floor.  Tense your lower abdominal muscles to press your lower back into the floor. This motion will rotate your pelvis so that your tail bone is scooping upwards rather than pointing at your feet or into the floor.  STRENGTHENING - Abdominals, Crunches   Lie on a firm bed or floor. Keeping your legs in front of you, bend your knees so they are both pointed toward the ceiling and your feet are flat on the floor. Cross your arms over your chest.  Slightly tip your chin down without bending your neck.  Tense your abdominals and slowly lift your trunk high enough to just clear your  shoulder blades. Lifting higher can put excessive stress on the lower back and does not further strengthen your abdominal muscles.  Control your return to the starting position.  STRENGTHENING - Quadruped, Opposite UE/LE Lift   Assume a hands and knees position on a firm surface. Keep your hands under your shoulders and your knees under your hips. You may place padding under your knees for comfort.  Find your neutral spine and gently tense your abdominal muscles so that you can maintain this position. Your shoulders and hips should form a rectangle that is parallel with the floor and is not twisted.  Keeping your trunk steady, lift your right hand no higher than your shoulder and then your left leg no higher than your hip. Make sure you are not holding your breath.   Continuing to keep your abdominal muscles tense and your back steady, slowly return to your starting position. Repeat with the opposite arm and leg.  STRENGTHENING - Lower Abdominals, Double Knee Lift  Lie on a firm bed or floor. Keeping your legs in front of you, bend your knees so they are both pointed toward the ceiling and your feet are  flat on the floor.  Tense your abdominal muscles to brace your lower back and slowly lift both of your knees until they come over your hips. Be certain not to hold your breath.  POSTURE AND BODY MECHANICS CONSIDERATIONS - Low Back Strain Keeping correct posture when sitting, standing or completing your activities will reduce the stress put on different body tissues, allowing injured tissues a chance to heal and limiting painful experiences. The following are general guidelines for improved posture. Your physician or physical therapist will provide you with any instructions specific to your needs. While reading these guidelines, remember:  The exercises prescribed by your provider will help you have the flexibility and strength to maintain correct postures.  The correct posture provides the best environment for your joints to work. All of your joints have less wear and tear when properly supported by a spine with good posture. This means you will experience a healthier, less painful body.  Correct posture must be practiced with all of your activities, especially prolonged sitting and standing. Correct posture is as important when doing repetitive low-stress activities (typing) as it is when doing a single heavy-load activity (lifting). RESTING POSITIONS Consider which positions are most painful for you when choosing a resting position. If you have pain with flexion-based activities (sitting, bending, stooping, squatting), choose a position that allows you to rest in a less flexed posture. You would want to avoid curling into a fetal position on your side. If your pain worsens with extension-based activities (prolonged standing, working overhead), avoid resting in an extended position such as sleeping on your stomach. Most people will find more comfort when they rest with their spine in a more neutral position, neither too rounded nor too arched. Lying on a non-sagging bed on your side with a pillow  between your knees, or on your back with a pillow under your knees will often provide some relief. Keep in mind, being in any one position for a prolonged period of time, no matter how correct your posture, can still lead to stiffness. PROPER SITTING POSTURE In order to minimize stress and discomfort on your spine, you must sit with correct posture. Sitting with good posture should be effortless for a healthy body. Returning to good posture is a gradual process. Many people can work toward this most comfortably by  using various supports until they have the flexibility and strength to maintain this posture on their own. When sitting with proper posture, your ears will fall over your shoulders and your shoulders will fall over your hips. You should use the back of the chair to support your upper back. Your lower back will be in a neutral position, just slightly arched. You may place a small pillow or folded towel at the base of your lower back for support.  When working at a desk, create an environment that supports good, upright posture. Without extra support, muscles tire, which leads to excessive strain on joints and other tissues. Keep these recommendations in mind: CHAIR:  A chair should be able to slide under your desk when your back makes contact with the back of the chair. This allows you to work closely.  The chair's height should allow your eyes to be level with the upper part of your monitor and your hands to be slightly lower than your elbows. BODY POSITION  Your feet should make contact with the floor. If this is not possible, use a foot rest.  Keep your ears over your shoulders. This will reduce stress on your neck and lower back. INCORRECT SITTING POSTURES  If you are feeling tired and unable to assume a healthy sitting posture, do not slouch or slump. This puts excessive strain on your back tissues, causing more damage and pain. Healthier options include:  Using more support, like a  lumbar pillow.  Switching tasks to something that requires you to be upright or walking.  Talking a brief walk.  Lying down to rest in a neutral-spine position. PROLONGED STANDING WHILE SLIGHTLY LEANING FORWARD  When completing a task that requires you to lean forward while standing in one place for a long time, place either foot up on a stationary 2-4 inch high object to help maintain the best posture. When both feet are on the ground, the lower back tends to lose its slight inward curve. If this curve flattens (or becomes too large), then the back and your other joints will experience too much stress, tire more quickly, and can cause pain. CORRECT STANDING POSTURES Proper standing posture should be assumed with all daily activities, even if they only take a few moments, like when brushing your teeth. As in sitting, your ears should fall over your shoulders and your shoulders should fall over your hips. You should keep a slight tension in your abdominal muscles to brace your spine. Your tailbone should point down to the ground, not behind your body, resulting in an over-extended swayback posture.  INCORRECT STANDING POSTURES  Common incorrect standing postures include a forward head, locked knees and/or an excessive swayback. WALKING Walk with an upright posture. Your ears, shoulders and hips should all line-up. PROLONGED ACTIVITY IN A FLEXED POSITION When completing a task that requires you to bend forward at your waist or lean over a low surface, try to find a way to stabilize 3 out of 4 of your limbs. You can place a hand or elbow on your thigh or rest a knee on the surface you are reaching across. This will provide you more stability so that your muscles do not fatigue as quickly. By keeping your knees relaxed, or slightly bent, you will also reduce stress across your lower back. CORRECT LIFTING TECHNIQUES DO :   Assume a wide stance. This will provide you more stability and the opportunity  to get as close as possible to the object  which you are lifting.  Tense your abdominals to brace your spine. Bend at the knees and hips. Keeping your back locked in a neutral-spine position, lift using your leg muscles. Lift with your legs, keeping your back straight.  Test the weight of unknown objects before attempting to lift them.  Try to keep your elbows locked down at your sides in order get the best strength from your shoulders when carrying an object.  Always ask for help when lifting heavy or awkward objects. INCORRECT LIFTING TECHNIQUES DO NOT:   Lock your knees when lifting, even if it is a small object.  Bend and twist. Pivot at your feet or move your feet when needing to change directions.  Assume that you can safely pick up even a paper clip without proper posture.       IF you received an x-ray today, you will receive an invoice from [facility]. Please contact [facility] at [phone] with questions or concerns regarding your invoice.   IF you received labwork today, you will receive an invoice from [place]. Please contact [facility] at [phone] with questions or concerns regarding your invoice.   Our billing staff will not be able to assist you with questions regarding bills from these companies.  You will be contacted with the lab results as soon as they are available. The fastest way to get your results is to activate your My Chart account. Instructions are located on the last page of this paperwork. If you have not heard from [place] regarding the results in 2 weeks, please contact this office.

## 2016-07-22 NOTE — Progress Notes (Signed)
Ebony Stanley  MRN: [id number] DOB: 02/05/1956  Subjective:  Ebony Stanley is a 61 y.o. female seen in office today for a chief complaint of constant mid to lower back pain x 4 days ago. Denies acute injury, bladder/bowel incontinence, saddle anesthesia, radiculopathy. Cannot note what makes it worse, but notes lying down makes it better. Has tried ibuprofen with no relief, naproxen has provided some relief but not for the entire 12 hours. Has also tried tramadol with no full relief. Has also tried ice.  She has been sedentary lately due to recent shoulder injury.   Review of Systems  Constitutional: Negative for chills, fatigue and fever.  Gastrointestinal: Negative for abdominal pain, nausea and vomiting.  Genitourinary: Negative for dysuria, frequency and hematuria.    Patient Active Problem List   Diagnosis Date Noted  . Right shoulder pain 02/21/2016  . Right rotator cuff tendinitis 02/21/2016  . MRSA infection 12/13/2015  . Abscess   . Sepsis ([place]) 11/25/2015  . Cellulitis 11/24/2015  . Abscess of vulva s/p I&D 11/23/2015  . Cellulitis of right axilla 11/23/2015  . Cellulitis of abdominal wall 11/23/2015  . Obesity (BMI 30-39.9) 11/23/2015  . Hypokalemia 11/23/2015  . Leukocytosis 11/23/2015  . MRSA (methicillin resistant staph aureus) culture positive 11/23/2015  . Family history of ovarian cancer 11/30/2012  . Personal history of colonic adenomas 06/12/2012  . Unspecified constipation 06/12/2012  . Insomnia 05/23/2011  . Depression 05/23/2011    Current Outpatient Prescriptions on File Prior to Visit  Medication Sig Dispense Refill  . amitriptyline (ELAVIL) 10 MG tablet Take 1 tablet (10 mg total) by mouth at bedtime. 30 tablet 2  . buPROPion (WELLBUTRIN XL) 150 MG 24 hr tablet TAKE 1 TABLET (150 MG TOTAL) BY MOUTH DAILY. 30 tablet 0  . co-enzyme Q-10 30 MG capsule Take 30 mg by mouth 3 (three) times daily.    Marland Kitchen estradiol (ESTRACE) 1 MG tablet TAKE 1 TABLET (1  MG TOTAL) BY MOUTH DAILY. 30 tablet 0  . IBUPROFEN PO Take by mouth.    . MELATONIN PO Take by mouth.    . Multiple Vitamin (MULTIVITAMIN) tablet Take 1 tablet by mouth daily.    Marland Kitchen NAPROXEN SODIUM PO Take by mouth.    . progesterone (PROMETRIUM) 100 MG capsule TAKE 1 CAPSULE (100 MG TOTAL) BY MOUTH DAILY. 30 capsule 0  . senna (SENOKOT) 8.6 MG tablet Take 1 tablet by mouth 2 (two) times daily.    . traMADol (ULTRAM) 50 MG tablet Take by mouth every 6 (six) hours as needed.     No current facility-administered medications on file prior to visit.     Allergies  Allergen Reactions  . Kenalog [Triamcinolone] Anaphylaxis    Was mixed with lidocaine, unsure which caused the reaction      Objective:  BP 128/82   Pulse 83   Temp 98.2 F (36.8 C)   Resp 14   Ht 5' (1.524 m)   Wt 162 lb (73.5 kg)   LMP 04/22/2006   SpO2 99%   BMI 31.64 kg/m   Physical Exam  Constitutional: She is oriented to person, place, and time and well-developed, well-nourished, and in no distress.  HENT:  Head: Normocephalic and atraumatic.  Eyes: Conjunctivae are normal.  Neck: Normal range of motion.  Pulmonary/Chest: Effort normal.  Abdominal: There is no CVA tenderness.  Musculoskeletal:       Thoracic back: She exhibits tenderness (with palpation of musculature bilaterally). She exhibits normal range of motion.  Lumbar back: She exhibits tenderness (with palpaiton of musculature bilaterally) and spasm (right sided musculature). She exhibits normal range of motion and no bony tenderness.  Neurological: She is alert and oriented to person, place, and time. She has a normal Straight Leg Raise Test and a normal Tandem Gait Test. Gait normal. Gait normal.  Reflex Scores:      Patellar reflexes are 2+ on the right side and 2+ on the left side.      Achilles reflexes are 2+ on the right side and 2+ on the left side. Skin: Skin is warm and dry.  Psychiatric: Affect normal.  Vitals reviewed.   Results  for orders placed or performed in visit on 07/22/16 (from the past 24 hour(s))  POCT urinalysis dipstick     Status: None   Collection Time: 07/22/16  4:08 PM  Result Value Ref Range   Color, UA yellow yellow   Clarity, UA clear clear   Glucose, UA negative negative   Bilirubin, UA negative negative   Ketones, POC UA negative negative   Spec Grav, UA 1.030 1.030 - 1.035   Blood, UA negative negative   pH, UA 6.0 5.0 - 8.0   Protein Ur, POC negative negative   Urobilinogen, UA 0.2 Negative - 2.0   Nitrite, UA Negative Negative   Leukocytes, UA Negative Negative  POCT Microscopic Urinalysis ([facility])     Status: Abnormal   Collection Time: 07/22/16  4:14 PM  Result Value Ref Range   WBC,UR,HPF,POC Few (A) None WBC/hpf   RBC,UR,HPF,POC None None RBC/hpf   Bacteria Many (A) None, Too numerous to count   Mucus Absent Absent   Epithelial Cells, UR Per Microscopy Many (A) None, Too numerous to count cells/hpf    Assessment and Plan :  1. Acute bilateral low back pain without sciatica History and PE findings consistent with musculoskeletal etiology. Will treat accordingly. Instructed to return to clinic if symptoms worsen, do not improve in 7-10 days, or as needed - POCT urinalysis dipstick - POCT Microscopic Urinalysis ([facility]) - naproxen (NAPROSYN) 500 MG tablet; Take 1 tablet (500 mg total) by mouth 2 (two) times daily with a meal.  Dispense: 30 tablet; Refill: 0 - Urine culture  2. Muscle spasm - cyclobenzaprine (FLEXERIL) 5 MG tablet; Take 1 tablet (5 mg total) by mouth 3 (three) times daily as needed for muscle spasms.  Dispense: 60 tablet; Refill: 0  Tenna Delaine PA-C  [facility] 07/22/2016 4:15 PM

## 2016-07-24 LAB — URINE CULTURE: ORGANISM ID, BACTERIA: NO GROWTH

## 2016-07-25 ENCOUNTER — Telehealth: Payer: Self-pay | Admitting: Physician Assistant

## 2016-07-25 NOTE — Telephone Encounter (Signed)
Seen 07/22/16

## 2016-07-25 NOTE — Telephone Encounter (Signed)
Pt called back to also inform you that she is still having back pain.  Please advise.

## 2016-07-25 NOTE — Telephone Encounter (Signed)
Pt wanted to call and let you know that she takes celebrex 200mg  once a day.  Pt contact phone # [phone]

## 2016-07-26 NOTE — Telephone Encounter (Signed)
I contacted pt and left a voicemail. Instructed her that it often takes a full week to 10 days to have full improvement so if she is better but just not back to baseline I would continue medication and stretching and contact me in a 4-7 days if she is still not back to baseline. However if her pain is worsened or she has not had any improvement, she needs to return for further evaluation.

## 2016-07-30 ENCOUNTER — Telehealth: Payer: Self-pay | Admitting: Family Medicine

## 2016-07-30 NOTE — Telephone Encounter (Signed)
Any other advice? Recheck?

## 2016-07-30 NOTE — Telephone Encounter (Signed)
WISEMAN PT CALLED WANTING YOU TO LET YOU KNOW THAT THE FLEXERIL THAT SHE TAKE AT NIGHT AND THE NAPROXEN THAT SHE TAKE DURING THE DAY ISN'T HELPING WITH HER BACK Pain please respond

## 2016-07-31 NOTE — Telephone Encounter (Signed)
Pt states feels better in morning but as the day goes on the pain returns.

## 2016-07-31 NOTE — Telephone Encounter (Signed)
Yes, please have her return for reevaluation.

## 2016-08-01 NOTE — Telephone Encounter (Signed)
Advised return if worsening or new sx's

## 2016-08-07 ENCOUNTER — Other Ambulatory Visit: Payer: Self-pay | Admitting: Physician Assistant

## 2016-08-07 DIAGNOSIS — M62838 Other muscle spasm: Secondary | ICD-10-CM

## 2016-08-09 NOTE — Telephone Encounter (Signed)
07/22/16 last refill

## 2016-08-16 ENCOUNTER — Other Ambulatory Visit: Payer: Self-pay

## 2016-08-16 MED ORDER — CELECOXIB 200 MG PO CAPS: 200.0000 mg | ORAL_CAPSULE | Freq: Every day | ORAL | 0 refills | Status: DC

## 2016-08-16 NOTE — Progress Notes (Signed)
Fax req [facility] [address]  Flexeril.    IC pt - she states she does not need Flexeril but would like to have Celebrex.  States she talked it over with [place].  Also stated her [facility] Attorney said she could get refilled ????  Pended order and routed to [place].

## 2016-08-16 NOTE — Progress Notes (Signed)
I called patient. She needs celebrex for her hand injury and was told to have her primary care office prescribe it. I instructed her that she cannot take this with naproxen, she understands. She also understands that using an NSAID long term can be detrimental to the kidneys but states this is the only thing that keeps her pain controlled. I have informed her to return in the next 1-3 months for repeat CMP to evaluate her kidney function. She agrees to do so.

## 2016-08-21 ENCOUNTER — Other Ambulatory Visit: Payer: Self-pay

## 2016-08-21 MED ORDER — AMITRIPTYLINE HCL 10 MG PO TABS: 10.0000 mg | ORAL_TABLET | Freq: Every day | ORAL | 2 refills | Status: DC

## 2016-08-21 NOTE — Telephone Encounter (Signed)
Fax req [facility] [address] Amitriptyline HCL 10mg  To Dr. Brigitte Pulse

## 2016-09-19 ENCOUNTER — Other Ambulatory Visit: Payer: Self-pay | Admitting: Physician Assistant

## 2017-06-03 ENCOUNTER — Encounter: Payer: Self-pay | Admitting: Internal Medicine

## 2017-07-04 ENCOUNTER — Other Ambulatory Visit: Payer: Self-pay | Admitting: Family Medicine

## 2017-08-17 ENCOUNTER — Other Ambulatory Visit: Payer: Self-pay | Admitting: Family Medicine

## 2017-09-09 ENCOUNTER — Other Ambulatory Visit: Payer: Self-pay

## 2017-09-09 ENCOUNTER — Ambulatory Visit (INDEPENDENT_AMBULATORY_CARE_PROVIDER_SITE_OTHER): Payer: BC Managed Care – PPO

## 2017-09-09 ENCOUNTER — Ambulatory Visit (INDEPENDENT_AMBULATORY_CARE_PROVIDER_SITE_OTHER): Payer: BC Managed Care – PPO | Admitting: Family Medicine

## 2017-09-09 ENCOUNTER — Encounter: Payer: Self-pay | Admitting: Family Medicine

## 2017-09-09 VITALS — BP 142/76 | HR 100 | Temp 98.7°F | Ht 60.63 in | Wt 189.4 lb

## 2017-09-09 DIAGNOSIS — Z0000 Encounter for general adult medical examination without abnormal findings: Secondary | ICD-10-CM | POA: Diagnosis not present

## 2017-09-09 DIAGNOSIS — G4700 Insomnia, unspecified: Secondary | ICD-10-CM

## 2017-09-09 DIAGNOSIS — M545 Low back pain: Secondary | ICD-10-CM | POA: Diagnosis not present

## 2017-09-09 DIAGNOSIS — Z1329 Encounter for screening for other suspected endocrine disorder: Secondary | ICD-10-CM | POA: Diagnosis not present

## 2017-09-09 DIAGNOSIS — R252 Cramp and spasm: Secondary | ICD-10-CM | POA: Diagnosis not present

## 2017-09-09 DIAGNOSIS — Z130 Encounter for screening for diseases of the blood and blood-forming organs and certain disorders involving the immune mechanism: Secondary | ICD-10-CM

## 2017-09-09 DIAGNOSIS — G8929 Other chronic pain: Secondary | ICD-10-CM

## 2017-09-09 DIAGNOSIS — R059 Cough, unspecified: Secondary | ICD-10-CM

## 2017-09-09 DIAGNOSIS — Z13228 Encounter for screening for other metabolic disorders: Secondary | ICD-10-CM

## 2017-09-09 DIAGNOSIS — R05 Cough: Secondary | ICD-10-CM | POA: Diagnosis not present

## 2017-09-09 DIAGNOSIS — Z13220 Encounter for screening for lipoid disorders: Secondary | ICD-10-CM

## 2017-09-09 LAB — POCT URINALYSIS DIP (MANUAL ENTRY)
Bilirubin, UA: NEGATIVE
Blood, UA: NEGATIVE
Glucose, UA: NEGATIVE mg/dL
Ketones, POC UA: NEGATIVE mg/dL
Leukocytes, UA: NEGATIVE
Nitrite, UA: NEGATIVE
Protein Ur, POC: NEGATIVE mg/dL
Spec Grav, UA: 1.01 (ref 1.010–1.025)
Urobilinogen, UA: 0.2 E.U./dL
pH, UA: 6 (ref 5.0–8.0)

## 2017-09-09 MED ORDER — CELECOXIB 100 MG PO CAPS: 100.0000 mg | ORAL_CAPSULE | Freq: Two times a day (BID) | ORAL | 2 refills | Status: DC

## 2017-09-09 MED ORDER — CYCLOBENZAPRINE HCL 10 MG PO TABS: 5.0000 mg | ORAL_TABLET | Freq: Three times a day (TID) | ORAL | 0 refills | Status: AC | PRN

## 2017-09-09 MED ORDER — TRAZODONE HCL 50 MG PO TABS: 50.0000 mg | ORAL_TABLET | Freq: Every evening | ORAL | 3 refills | Status: DC | PRN

## 2017-09-09 MED ORDER — OMEPRAZOLE 20 MG PO CPDR: 20.0000 mg | DELAYED_RELEASE_CAPSULE | Freq: Two times a day (BID) | ORAL | 0 refills | Status: DC

## 2017-09-09 NOTE — Patient Instructions (Addendum)
1. shingrix (shingles vaccine) at pharmacy of choice 2. Please schedule your colonoscopy with provider of choice     IF you received an x-ray today, you will receive an invoice from [facility]. Please contact [facility] at [phone] with questions or concerns regarding your invoice.   IF you received labwork today, you will receive an invoice from [facility]. Please contact [facility] at [phone] with questions or concerns regarding your invoice.   Our billing staff will not be able to assist you with questions regarding bills from these companies.  You will be contacted with the lab results as soon as they are available. The fastest way to get your results is to activate your My Chart account. Instructions are located on the last page of this paperwork. If you have not heard from [place] regarding the results in 2 weeks, please contact this office.

## 2017-09-09 NOTE — Progress Notes (Signed)
5/21/201911:24 AM  Ebony Stanley March 02, 1956, 62 y.o. female [id number]  Chief Complaint  Patient presents with  . Annual Exam    having back pain on and off with normal activity. Having cramping in the torso area and right leg. Having trouble sleeping as well. Has appt for pap, mammogram on Thursday  . Cough    for weeks on and off    HPI:   Patient is a 62 y.o. female who presents today for CPE  Cervical Cancer Screening: has appt in 2 days with [place] Screening: has appt in 2 days with [place] Screening: 2014, + tubular adenoma, repeat was due Feb of this year, has not had, she will schedule as she might have it done in [place]: ordered by Gyn HIV Screening: 2017 Seasonal Influenza Vaccination: will need this season Td/Tdap Vaccination: declines Pneumococcal Vaccination: at age 16 Zoster Vaccination: discussed with patient today, at pharmacy of choice Frequency of Dental evaluation: Q6 months Frequency of Eye evaluation: annually  She presents today with several concerns: 1. Worsening chronic midline back pain. For years. No trauma. Becoming more frequent. Triggers are cleaning the house, walking. Making it more uncomfortable to go shopping, exercise at the gym, go on outings. Pain does not radiate, denies any weakness or changes in bowel/bladder function. Takes ibuprofen or naproxen prn. Does not help as much. Used to do better on celebrex. Tries to stretch daily. interfering with her already poor sleep.  2. Insomnia, chronic, used to be on amitriptyline 43m, not working anymore, takes melatonin as well. Would like something different  3. Having recurring muscle cramps. Including of torso/abdomen if she bend over at times. Worse after day of walking/sweating. Takes magnesium daily. Will also takes mustard or pickle juice. Not helping. She wonders about her electrolytes.   4.chronic intermittent dry  cough. Worse at night. Denies any nasal congestion, PND heartburn. She does try to sleep on several pillows. She does not smoke. Had asthma remotely but nothing in the last 20 years. She denies any SOB, chest tightness or wheezing with these episodes. She denies any new medications or exposures. She denies any sneezing or itchiness. She denies any fever, chills, night sweats.  Fall Risk  09/09/2017 02/06/2016  Falls in the past year? No No     [facility] 09/09/2017 02/06/2016 06/28/2015  Decreased Interest 0 0 0  Down, Depressed, Hopeless 0 0 0  PHQ - 2 Score 0 0 0    Allergies  Allergen Reactions  . Kenalog  [Triamcinolone Acetonide] Anaphylaxis  . Kenalog [Triamcinolone] Anaphylaxis    Was mixed with lidocaine, unsure which caused the reaction     Prior to Admission medications   Medication Sig Start Date End Date Taking? Authorizing Provider  amitriptyline (ELAVIL) 10 MG tablet TAKE 1 TABLET BY MOUTH EVERYDAY AT BEDTIME 08/18/17  Yes SShawnee Knapp MD  IBUPROFEN PO Take by mouth.   Yes [provider]  MELATONIN PO Take by mouth.   Yes [provider]  Multiple Vitamin (MULTIVITAMIN) tablet Take 1 tablet by mouth daily.   Yes [provider]  NAPROXEN SODIUM PO Take by mouth.   Yes [provider]  senna (SENOKOT) 8.6 MG tablet Take 1 tablet by mouth 2 (two) times daily.   Yes [provider]  Magnesium 100 MG CAPS 100 mg.    [provider]  traMADol (ULTRAM) 50 MG tablet Take by mouth  every 6 (six) hours as needed.    [provider]    Past Medical History:  Diagnosis Date  . Abscess of vulva 11/23/2015  . Anaphylactic reaction   . Anxiety   . Arthritis   . Asthma    when pregnant  . Complication of anesthesia    unknown if Lidocaine was the cause of the anaphylaxis  . Constipation   . Depression   . Heart murmur    as child  . Personal history of colonic adenomas 06/12/2012   history in 2011 with  2 adenomas Ferdinand Lango) 06/15/2012 cecal polyp 7 mm adenoma      Past Surgical History:  Procedure Laterality Date  . [place]  . INCISION AND DRAINAGE ABSCESS N/A 11/20/2015   Procedure: INCISION AND DRAINAGE ABSCESS Vulva;  Surgeon: Jerelyn Charles, MD;  Location: [facility];  Service: Gynecology;  Laterality: N/A;  . IRRIGATION AND DEBRIDEMENT ABSCESS Right 11/27/2015   Procedure: IRRIGATION AND DEBRIDEMENT RIGHT AXILLARY ABSCESS AND ABDOMINAL WALL;  Surgeon: Alphonsa Overall, MD;  Location: [facility];  Service: General;  Laterality: Right;  . KNEE ARTHROSCOPY Right    x 2  . TONSILLECTOMY    . WRIST SURGERY      Social History   Tobacco Use  . Smoking status: Never Smoker  . Smokeless tobacco: Never Used  Substance Use Topics  . Alcohol use: Yes    Comment: very rare    Family History  Problem Relation Age of Onset  . Ovarian cancer Mother 25  . Hyperlipidemia Mother   . Alcohol abuse Father   . Hyperlipidemia Father   . Leukemia Paternal Grandmother   . Bipolar disorder Daughter   . Colon cancer Neg Hx     Review of Systems  Constitutional: Negative for chills, diaphoresis, fever, malaise/fatigue and weight loss.  HENT: Negative for congestion, ear pain, sinus pain and sore throat.   Respiratory: Positive for cough. Negative for hemoptysis, sputum production, shortness of breath and wheezing.   Cardiovascular: Negative for chest pain, palpitations, orthopnea, leg swelling and PND.  Gastrointestinal: Negative for abdominal pain, blood in stool, constipation, diarrhea, melena, nausea and vomiting.  Genitourinary: Negative for dysuria and hematuria.  Musculoskeletal: Positive for back pain. Negative for myalgias.  Neurological: Negative for dizziness, tingling, focal weakness and headaches.  Endo/Heme/Allergies: Positive for environmental allergies. Negative for polydipsia.  Psychiatric/Behavioral: Negative for depression. The patient is not nervous/anxious.   All other  systems reviewed and are negative.    OBJECTIVE:  Blood pressure (!) 142/76, pulse 100, temperature 98.7 F (37.1 C), temperature source Oral, height 5' 0.63" (1.54 m), weight 189 lb 6.4 oz (85.9 kg), last menstrual period 04/22/2006, SpO2 96 %.  BP Readings from Last 3 Encounters:  09/09/17 (!) 142/76  07/22/16 128/82  06/17/16 124/82    Physical Exam  Constitutional: She is oriented to person, place, and time. She appears well-developed and well-nourished.  HENT:  Head: Normocephalic and atraumatic.  Right Ear: Hearing, tympanic membrane, external ear and ear canal normal.  Stanley Ear: Hearing, tympanic membrane, external ear and ear canal normal.  Mouth/Throat: Oropharynx is clear and moist.  Eyes: Pupils are equal, round, and reactive to light. EOM are normal.  Neck: Neck supple. No thyromegaly present.  Cardiovascular: Normal rate, regular rhythm, normal heart sounds and intact distal pulses. Exam reveals no gallop and no friction rub.  No murmur heard. Pulmonary/Chest: Effort normal and breath sounds normal. She has no wheezes. She has no rales.  Abdominal: Soft. Bowel sounds are normal. She exhibits no distension and no mass. There is no tenderness.  Musculoskeletal: Normal range of motion. She exhibits no edema.  Lymphadenopathy:    She has no cervical adenopathy.  Neurological: She is alert and oriented to person, place, and time. She has normal strength and normal reflexes. No cranial nerve deficit. Gait normal.  Skin: Skin is warm and dry.  Psychiatric: She has a normal mood and affect.  Nursing note and vitals reviewed.   Results for orders placed or performed in visit on 09/09/17 (from the past 24 hour(s))  POCT urinalysis dipstick     Status: None   Collection Time: 09/09/17  2:08 PM  Result Value Ref Range   Color, UA yellow yellow   Clarity, UA clear clear   Glucose, UA negative negative mg/dL   Bilirubin, UA negative negative   Ketones, POC UA negative  negative mg/dL   Spec Grav, UA 1.010 1.010 - 1.025   Blood, UA negative negative   pH, UA 6.0 5.0 - 8.0   Protein Ur, POC negative negative mg/dL   Urobilinogen, UA 0.2 0.2 or 1.0 E.U./dL   Nitrite, UA Negative Negative   Leukocytes, UA Negative Negative    Dg Lumbar Spine 2-3 Views  Result Date: 09/09/2017 CLINICAL DATA:  Chronic midline back pain EXAM: LUMBAR SPINE - 2-3 VIEW COMPARISON:  CT 09/16/2014 FINDINGS: Degenerative disc and facet disease in the lower lumbar spine. Normal alignment. No fracture. SI joints are symmetric and unremarkable. IMPRESSION: Degenerative disc and facet disease in the lower lumbar spine. No acute bony abnormality. Electronically Signed   By: Rolm Baptise M.D.   On: 09/09/2017 12:45    ASSESSMENT and PLAN  1. Annual physical exam No concerns per history or exam. Routine HCM labs ordered. HCM reviewed/discussed. Anticipatory guidance regarding healthy weight, lifestyle and choices given.   2. Screening for thyroid disorder - TSH  3. Insomnia, unspecified type Chronic, dc TCA. Will do trial of trazodone. New med r/se/b reviewed.  - traZODone (DESYREL) 50 MG tablet; Take 1-2 tablets (50-100 mg total) by mouth at bedtime as needed for sleep.  4. Screening for lipid disorders - Lipid panel  5. Screening for deficiency anemia - CBC with Differential/Platelet  6. Screening for metabolic disorder - [id number] - POCT urinalysis dipstick  7. Chronic midline low back pain without sciatica DDD per xray. Discussed treatment options. Patient would like to retry celebrex/flexeril. meds r/se/b reviewed. Patient educational handout for home exercise program given. - DG Lumbar Spine 2-3 Views; Future  celecoxib (CELEBREX) 100 MG capsule; Take 1 capsule (100 mg total) by mouth 2 (two) times daily. - cyclobenzaprine (FLEXERIL) 10 MG tablet; Take 0.5-1 tablets (5-10 mg total) by mouth 3 (three) times daily as needed for muscle spasms.  8. Cough Exam reassuring,  non smoker. No evidence of PND on exam today, will do trial of PPI. Consider CXR if not resolved. - omeprazole (PRILOSEC) 20 MG capsule; Take 1 capsule (20 mg total) by mouth 2 (two) times daily before a meal.  9. Muscle cramps Discussed conservative measures: hydration, stretching, pickle juice. Checking electrolytes.  - Vitamin D, 25-hydroxy - Magnesium  Return in about 1 year (around 09/10/2018), or if symptoms worsen or fail to improve.    Rutherford Guys, MD Primary Care at [facility] [place], [place] [address] Ph.  [phone] Fax [phone]

## 2017-09-10 LAB — CBC WITH DIFFERENTIAL/PLATELET
Basophils Absolute: 0 10*3/uL (ref 0.0–0.2)
Basos: 0 %
EOS (ABSOLUTE): 0.3 10*3/uL (ref 0.0–0.4)
Eos: 4 %
Hematocrit: 40 % (ref 34.0–46.6)
Hemoglobin: 13.6 g/dL (ref 11.1–15.9)
Immature Grans (Abs): 0 10*3/uL (ref 0.0–0.1)
Immature Granulocytes: 0 %
Lymphocytes Absolute: 3.2 10*3/uL — ABNORMAL HIGH (ref 0.7–3.1)
Lymphs: 45 %
MCH: 31.5 pg (ref 26.6–33.0)
MCHC: 34 g/dL (ref 31.5–35.7)
MCV: 93 fL (ref 79–97)
Monocytes Absolute: 0.6 10*3/uL (ref 0.1–0.9)
Monocytes: 8 %
Neutrophils Absolute: 3.1 10*3/uL (ref 1.4–7.0)
Neutrophils: 43 %
Platelets: 428 10*3/uL (ref 150–450)
RBC: 4.32 x10E6/uL (ref 3.77–5.28)
RDW: 13.8 % (ref 12.3–15.4)
WBC: 7.2 10*3/uL (ref 3.4–10.8)

## 2017-09-10 LAB — LIPID PANEL
Chol/HDL Ratio: 4.1 ratio (ref 0.0–4.4)
Cholesterol, Total: 169 mg/dL (ref 100–199)
HDL: 41 mg/dL (ref >39)
LDL Calculated: 90 mg/dL (ref 0–99)
Triglycerides: 188 mg/dL — ABNORMAL HIGH (ref 0–149)
VLDL Cholesterol Cal: 38 mg/dL (ref 5–40)

## 2017-09-10 LAB — CMP14+EGFR
ALT: 38 IU/L — ABNORMAL HIGH (ref 0–32)
AST: 29 IU/L (ref 0–40)
Albumin/Globulin Ratio: 2 (ref 1.2–2.2)
Albumin: 4.3 g/dL (ref 3.6–4.8)
Alkaline Phosphatase: 71 IU/L (ref 39–117)
BUN/Creatinine Ratio: 14 (ref 12–28)
BUN: 10 mg/dL (ref 8–27)
Bilirubin Total: 0.3 mg/dL (ref 0.0–1.2)
CO2: 22 mmol/L (ref 20–29)
Calcium: 9.1 mg/dL (ref 8.7–10.3)
Chloride: 102 mmol/L (ref 96–106)
Creatinine, Ser: 0.71 mg/dL (ref 0.57–1.00)
GFR calc Af Amer: 106 mL/min/{1.73_m2} (ref >59)
GFR calc non Af Amer: 92 mL/min/{1.73_m2} (ref >59)
Globulin, Total: 2.1 g/dL (ref 1.5–4.5)
Glucose: 97 mg/dL (ref 65–99)
Potassium: 4.2 mmol/L (ref 3.5–5.2)
Sodium: 141 mmol/L (ref 134–144)
Total Protein: 6.4 g/dL (ref 6.0–8.5)

## 2017-09-10 LAB — TSH: TSH: 2.74 u[IU]/mL (ref 0.450–4.500)

## 2017-09-10 LAB — MAGNESIUM: Magnesium: 1.9 mg/dL (ref 1.6–2.3)

## 2017-09-10 LAB — VITAMIN D 25 HYDROXY (VIT D DEFICIENCY, FRACTURES): Vit D, 25-Hydroxy: 30.1 ng/mL (ref 30.0–100.0)

## 2017-09-20 ENCOUNTER — Other Ambulatory Visit: Payer: Self-pay | Admitting: Family Medicine

## 2017-10-01 ENCOUNTER — Other Ambulatory Visit: Payer: Self-pay | Admitting: Family Medicine

## 2017-10-01 NOTE — Telephone Encounter (Signed)
trazodone refill Last Refill:09/09/17 # 60 3 RF Last OV: 09/09/17 PCP: Grant Fontana MD [facility] [place]

## 2017-10-25 ENCOUNTER — Other Ambulatory Visit: Payer: Self-pay | Admitting: Family Medicine

## 2017-10-27 NOTE — Telephone Encounter (Signed)
09/23/17 refill Last Refill:09/23/17 # 30 Last OV: 09/09/17 PCP: Dr Pamella Pert [facility]

## 2017-12-06 ENCOUNTER — Other Ambulatory Visit: Payer: Self-pay | Admitting: Family Medicine

## 2017-12-13 ENCOUNTER — Other Ambulatory Visit: Payer: Self-pay | Admitting: Family Medicine

## 2017-12-15 NOTE — Telephone Encounter (Signed)
trazodone refill Last Refill:09/09/17 # 60 3 RF Last OV: 09/09/17 PCP: Dr Pamella Pert [facility] [phone]

## 2017-12-16 NOTE — Telephone Encounter (Signed)
Patient is requesting a refill of the following medications: Requested Prescriptions   Pending Prescriptions Disp Refills  . traZODone (DESYREL) 50 MG tablet [Pharmacy Med Name: TRAZODONE 50 MG TABLET] 60 tablet 2    Sig: TAKE 1-2 TABLETS (50-100 MG TOTAL) BY MOUTH AT BEDTIME AS NEEDED FOR SLEEP.    Date of patient request: 12/13/2017 Last office visit: 09/09/2017 Date of last refill: 09/09/2017 Last refill amount: 60 RF 3  Follow up time period per chart:

## 2017-12-18 ENCOUNTER — Other Ambulatory Visit: Payer: Self-pay | Admitting: Family Medicine

## 2017-12-28 ENCOUNTER — Other Ambulatory Visit: Payer: Self-pay | Admitting: Family Medicine

## 2018-03-13 ENCOUNTER — Other Ambulatory Visit: Payer: Self-pay | Admitting: Family Medicine

## 2018-03-13 NOTE — Telephone Encounter (Signed)
Next OV due 5/20 Requested Prescriptions  Pending Prescriptions Disp Refills  . celecoxib (CELEBREX) 100 MG capsule [Pharmacy Med Name: CELECOXIB 100 MG CAPSULE] 180 capsule 1    Sig: TAKE 1 CAPSULE BY MOUTH TWICE A DAY     Analgesics:  COX2 Inhibitors Passed - 03/13/2018  2:14 AM      Passed - HGB in normal range and within 360 days    Hemoglobin  Date Value Ref Range Status  09/09/2017 13.6 11.1 - 15.9 g/dL Final         Passed - Cr in normal range and within 360 days    Creat  Date Value Ref Range Status  11/30/2012 0.70 0.50 - 1.10 mg/dL Final   Creatinine, Ser  Date Value Ref Range Status  09/09/2017 0.71 0.57 - 1.00 mg/dL Final         Passed - Patient is not pregnant      Passed - Valid encounter within last 12 months    Recent Outpatient Visits          6 months ago Annual physical exam   [facility], Lilia Argue, MD   1 year ago Acute bilateral low back pain without sciatica   [facility], [place] D, PA-C   1 year ago Annual physical exam   [facility], Laurey Arrow, MD   2 years ago Right shoulder pain, unspecified chronicity   [facility], Carroll Sage, FNP   2 years ago Fever, unspecified   [facility] at [facility], Loura Back, MD

## 2018-04-01 ENCOUNTER — Other Ambulatory Visit: Payer: Self-pay | Admitting: Family Medicine

## 2018-04-02 NOTE — Telephone Encounter (Signed)
Requested medication (s) are due for refill today: yes  Requested medication (s) are on the active medication list: yes  Last refill:  12/16/17  Future visit scheduled: no- per notes for PE pt is to be seen in May 2020  Notes to clinic:  Medication used for sleep   Requested Prescriptions  Pending Prescriptions Disp Refills   traZODone (DESYREL) 50 MG tablet [Pharmacy Med Name: TRAZODONE 50 MG TABLET] 180 tablet 0    Sig: TAKE 1-2 TABLETS (50-100 MG TOTAL) BY MOUTH AT BEDTIME AS NEEDED FOR SLEEP.     Psychiatry: Antidepressants - Serotonin Modulator Failed - 04/01/2018  1:44 AM      Failed - Valid encounter within last 6 months    Recent Outpatient Visits          6 months ago Annual physical exam   [facility], Lilia Argue, MD   1 year ago Acute bilateral low back pain without sciatica   [facility] at [facility], [place] D, PA-C   1 year ago Annual physical exam   [facility], Laurey Arrow, MD   2 years ago Right shoulder pain, unspecified chronicity   [facility], Carroll Sage, FNP   2 years ago Fever, unspecified   [facility], MD             Passed - Completed PHQ-2 or PHQ-9 in the last 360 days.

## 2018-04-06 NOTE — Telephone Encounter (Signed)
rx request 

## 2018-07-06 ENCOUNTER — Other Ambulatory Visit: Payer: Self-pay | Admitting: Family Medicine

## 2018-08-04 ENCOUNTER — Other Ambulatory Visit: Payer: Self-pay | Admitting: Family Medicine

## 2018-08-04 NOTE — Telephone Encounter (Signed)
Requested medication (s) are due for refill today:  Yes  Requested medication (s) are on the active medication list:   Yes  Future visit scheduled:   No   I left a message for her to call and schedule an OV for medication refills.   Yearly CPE due.   Last ordered: 07/06/2018  #120  0 refills.   Sees Dr. Pamella Pert.  Forwarded to provider for review for refill since had a courtesy refill in March.   Requested Prescriptions  Pending Prescriptions Disp Refills   traZODone (DESYREL) 50 MG tablet [Pharmacy Med Name: TRAZODONE 50 MG TABLET] 60 tablet 1    Sig: TAKE 1-2 TABLETS BY MOUTH AT BEDTIME AS NEEDED FOR SLEEP. OFFICE VISIT NEEDED FOR ADDITIONAL REFILLS.     Psychiatry: Antidepressants - Serotonin Modulator Failed - 08/04/2018  8:32 AM      Failed - Valid encounter within last 6 months    Recent Outpatient Visits          10 months ago Annual physical exam   [facility], Lilia Argue, MD   2 years ago Acute bilateral low back pain without sciatica   [facility], Reather Laurence, PA-C   2 years ago Annual physical exam   [facility], Laurey Arrow, MD   2 years ago Right shoulder pain, unspecified chronicity   [facility], Carroll Sage, FNP   3 years ago Fever, unspecified   [facility], MD             Passed - Completed PHQ-2 or PHQ-9 in the last 360 days.

## 2018-09-29 NOTE — Telephone Encounter (Signed)
LVM to schedule appt

## 2020-02-05 IMAGING — DX DG LUMBAR SPINE 2-3V
3 series · 3 of 3 positions shown · non-contrast
Comparison: CT 09/16/2014

CLINICAL DATA: Chronic midline back pain

EXAM:
LUMBAR SPINE - 2-3 VIEW

[l-spine ap]
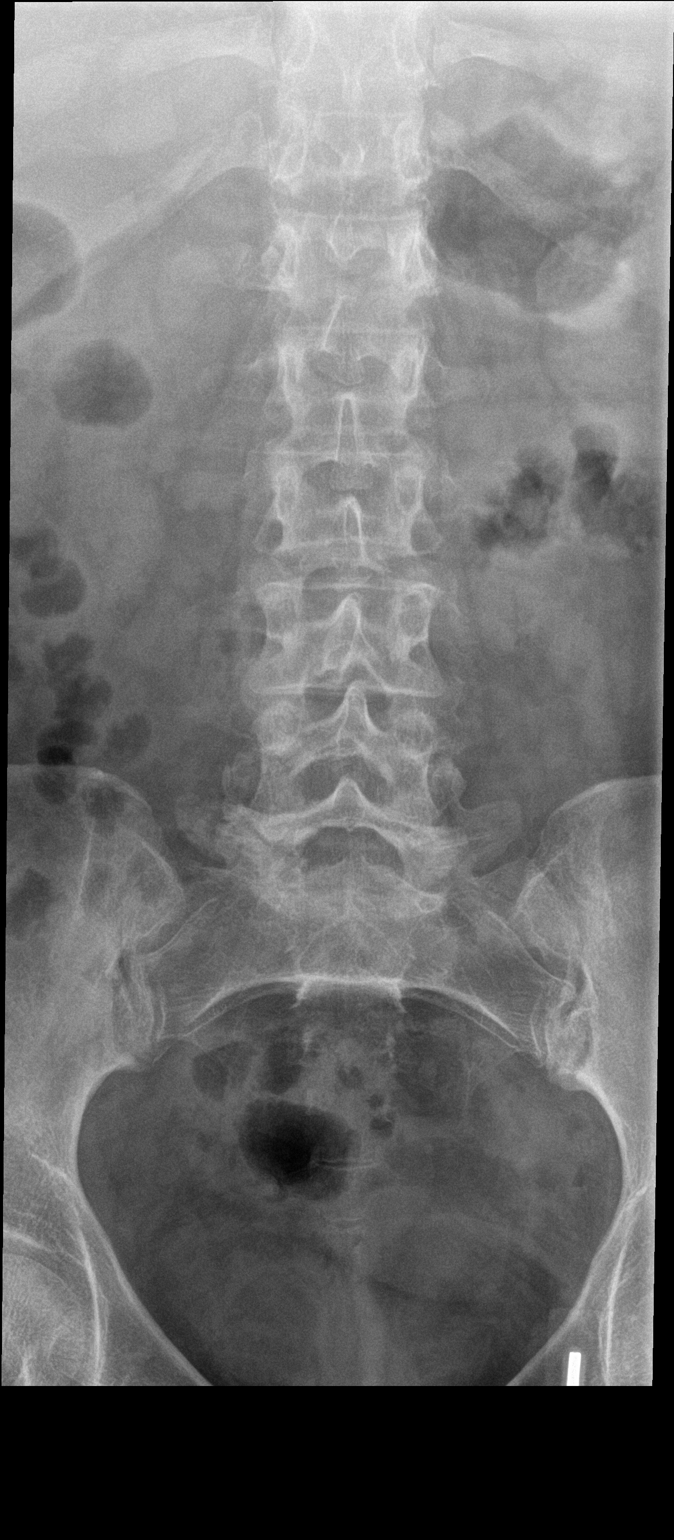

[l-spine lat]
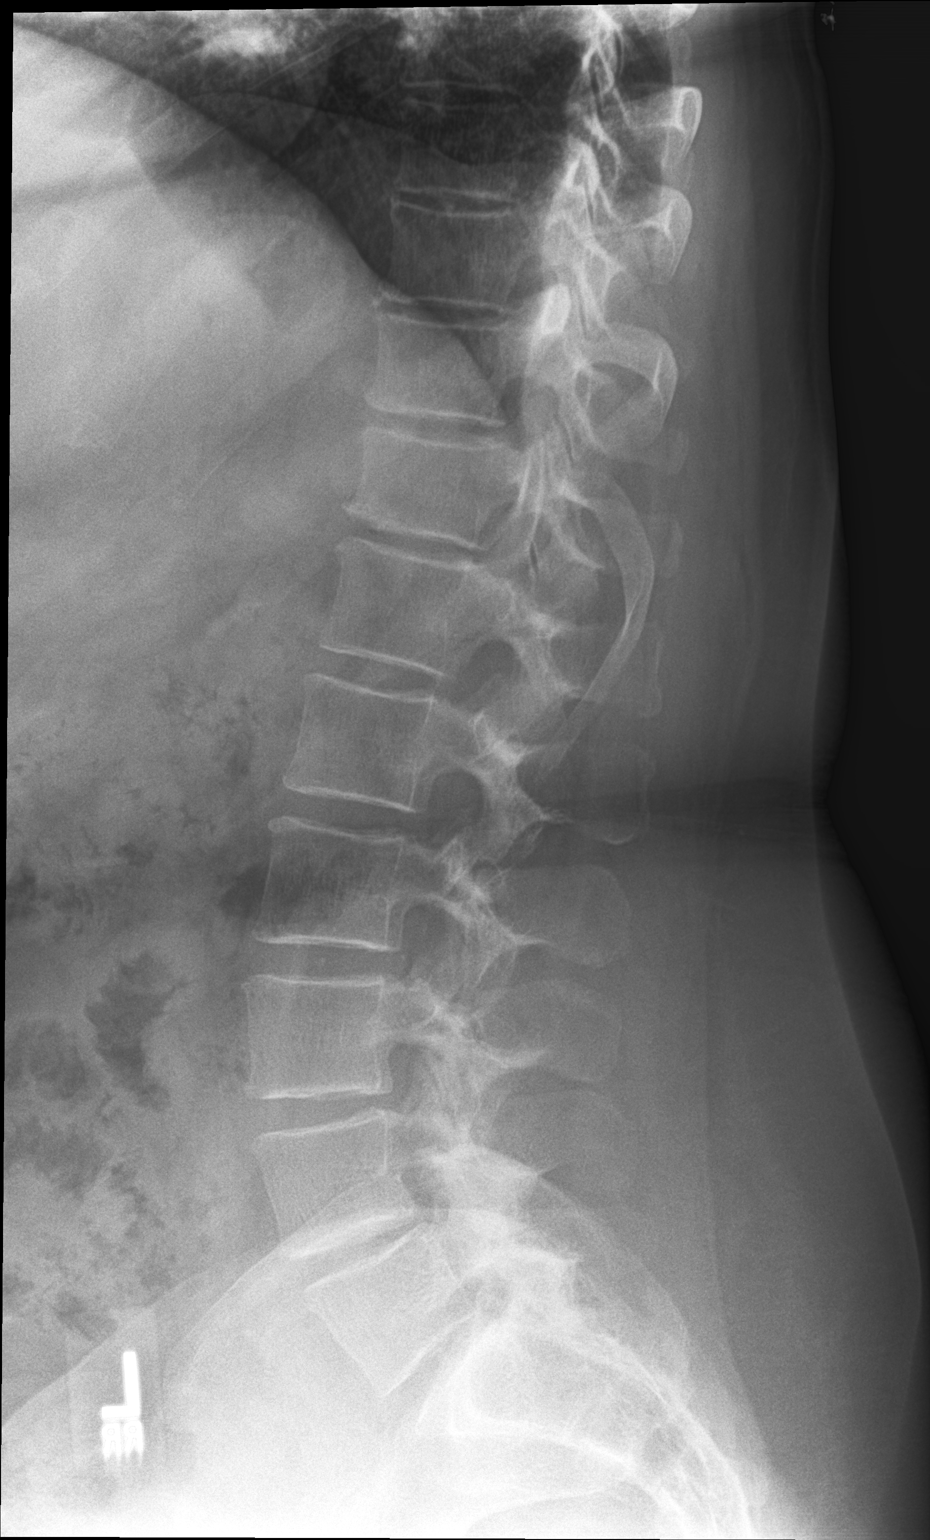

[l-spine l5-s1]
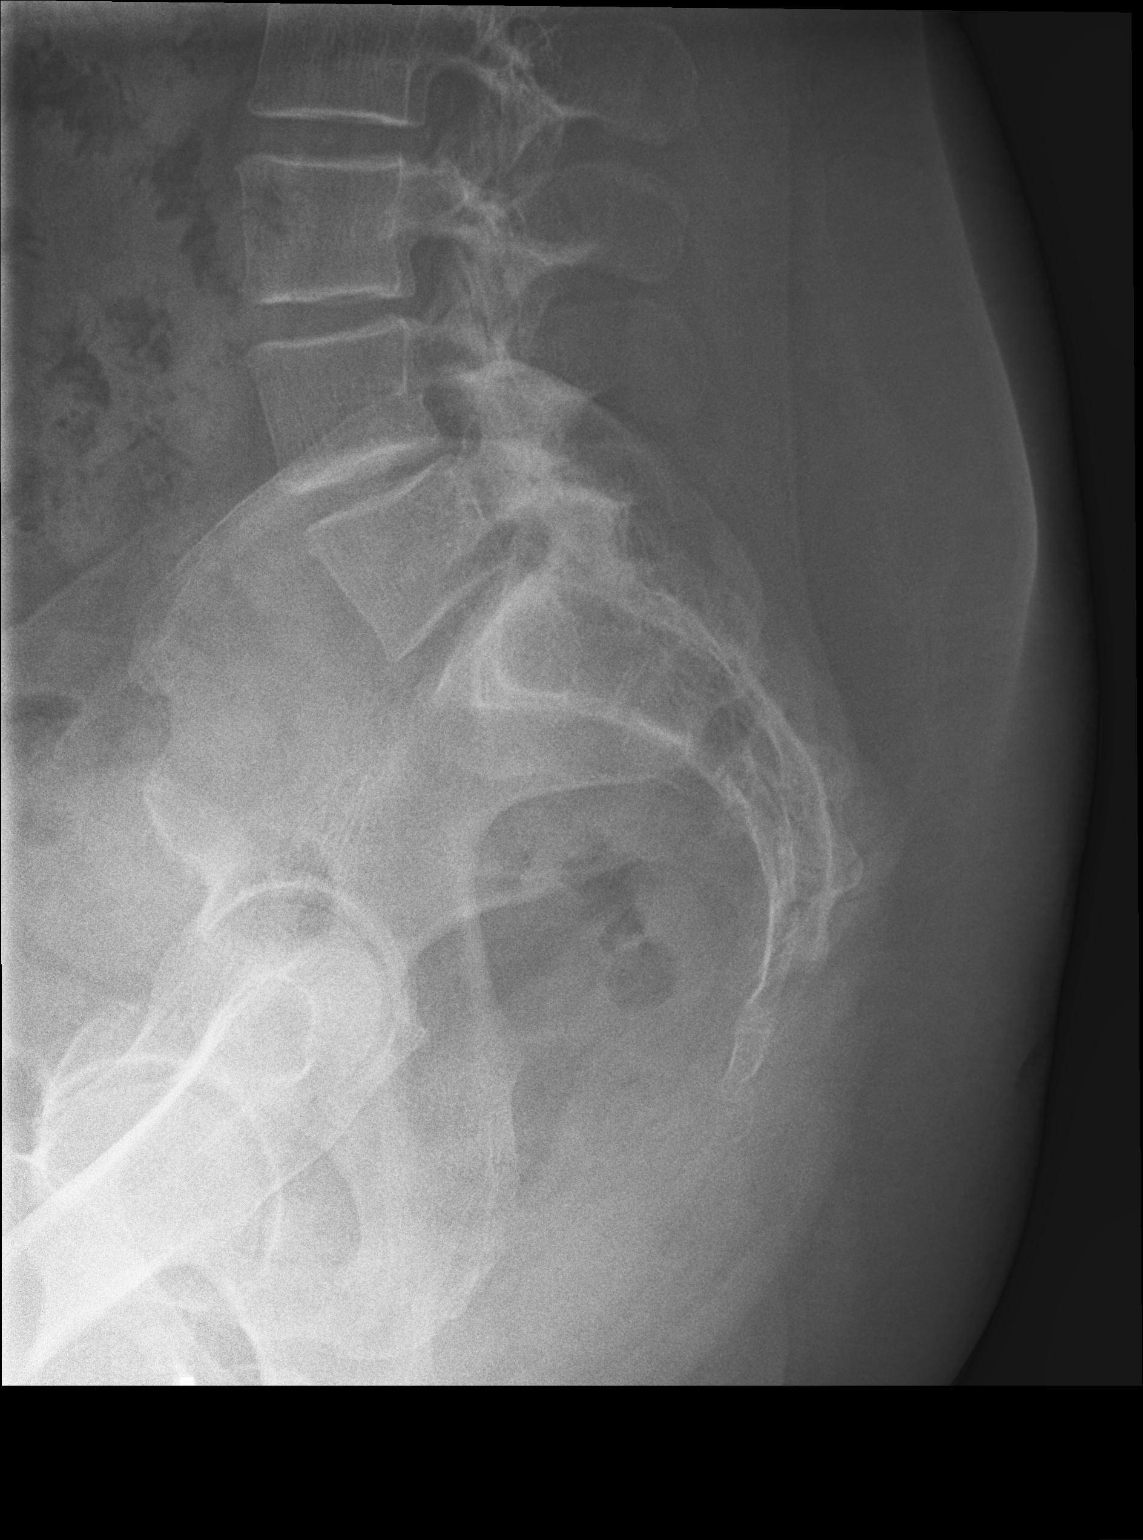

[3 of 3 positions shown; findings below may reference images not displayed]

FINDINGS: Degenerative disc and facet disease in the lower lumbar spine.
Normal alignment. No fracture. SI joints are symmetric and
unremarkable.
IMPRESSION: Degenerative disc and facet disease in the lower lumbar spine. No
acute bony abnormality.
